# Patient Record
Sex: Male | Born: 1965 | Race: Black or African American | Hispanic: No | Marital: Married | Smoking: Former smoker
Health system: Southern US, Community
[De-identification: ages and names within clinical notes are randomized; demographics above are authoritative.]

## PROBLEM LIST (undated history)

## (undated) DIAGNOSIS — G4730 Sleep apnea, unspecified: Secondary | ICD-10-CM

## (undated) DIAGNOSIS — D481 Neoplasm of uncertain behavior of connective and other soft tissue: Secondary | ICD-10-CM

## (undated) DIAGNOSIS — G5710 Meralgia paresthetica, unspecified lower limb: Secondary | ICD-10-CM

## (undated) DIAGNOSIS — K5792 Diverticulitis of intestine, part unspecified, without perforation or abscess without bleeding: Secondary | ICD-10-CM

## (undated) DIAGNOSIS — C801 Malignant (primary) neoplasm, unspecified: Secondary | ICD-10-CM

## (undated) DIAGNOSIS — I10 Essential (primary) hypertension: Secondary | ICD-10-CM

## (undated) DIAGNOSIS — J189 Pneumonia, unspecified organism: Secondary | ICD-10-CM

## (undated) DIAGNOSIS — Z923 Personal history of irradiation: Secondary | ICD-10-CM

## (undated) DIAGNOSIS — S83209A Unspecified tear of unspecified meniscus, current injury, unspecified knee, initial encounter: Secondary | ICD-10-CM

## (undated) HISTORY — PX: TUMOR REMOVAL: SHX12

## (undated) HISTORY — PX: LEG SURGERY: SHX1003

## (undated) HISTORY — DX: Meralgia paresthetica, unspecified lower limb: G57.10

## (undated) HISTORY — PX: OTHER SURGICAL HISTORY: SHX169

---

## 1998-04-27 HISTORY — PX: OTHER SURGICAL HISTORY: SHX169

## 2004-06-05 ENCOUNTER — Ambulatory Visit (HOSPITAL_COMMUNITY): Admission: RE | Admit: 2004-06-05 | Discharge: 2004-06-05 | Payer: Self-pay | Admitting: Gastroenterology

## 2006-05-29 ENCOUNTER — Emergency Department (HOSPITAL_COMMUNITY): Admission: EM | Admit: 2006-05-29 | Discharge: 2006-05-30 | Payer: Self-pay | Admitting: Emergency Medicine

## 2006-06-03 ENCOUNTER — Encounter: Admission: RE | Admit: 2006-06-03 | Discharge: 2006-06-03 | Payer: Self-pay | Admitting: Family Medicine

## 2007-01-21 ENCOUNTER — Ambulatory Visit (HOSPITAL_COMMUNITY): Admission: RE | Admit: 2007-01-21 | Discharge: 2007-01-21 | Payer: Self-pay | Admitting: Surgery

## 2007-01-25 ENCOUNTER — Encounter: Admission: RE | Admit: 2007-01-25 | Discharge: 2007-01-25 | Payer: Self-pay | Admitting: Surgery

## 2007-02-09 ENCOUNTER — Ambulatory Visit (HOSPITAL_COMMUNITY): Admission: RE | Admit: 2007-02-09 | Discharge: 2007-02-09 | Payer: Self-pay | Admitting: Surgery

## 2007-03-16 ENCOUNTER — Ambulatory Visit (HOSPITAL_COMMUNITY): Admission: RE | Admit: 2007-03-16 | Discharge: 2007-03-16 | Payer: Self-pay | Admitting: Surgery

## 2007-04-12 ENCOUNTER — Encounter: Admission: RE | Admit: 2007-04-12 | Discharge: 2007-04-12 | Payer: Self-pay | Admitting: Surgery

## 2007-04-26 ENCOUNTER — Ambulatory Visit (HOSPITAL_COMMUNITY): Admission: RE | Admit: 2007-04-26 | Discharge: 2007-04-27 | Payer: Self-pay | Admitting: Surgery

## 2007-04-26 HISTORY — PX: LAPAROSCOPIC GASTRIC BANDING: SHX1100

## 2007-05-03 ENCOUNTER — Encounter: Admission: RE | Admit: 2007-05-03 | Discharge: 2007-08-01 | Payer: Self-pay | Admitting: Surgery

## 2007-08-10 ENCOUNTER — Encounter: Admission: RE | Admit: 2007-08-10 | Discharge: 2007-08-10 | Payer: Self-pay | Admitting: Surgery

## 2007-11-08 ENCOUNTER — Encounter: Admission: RE | Admit: 2007-11-08 | Discharge: 2007-11-08 | Payer: Self-pay | Admitting: Surgery

## 2008-08-25 HISTORY — PX: HERNIA REPAIR: SHX51

## 2008-10-22 ENCOUNTER — Ambulatory Visit (HOSPITAL_COMMUNITY): Admission: RE | Admit: 2008-10-22 | Discharge: 2008-10-22 | Payer: Self-pay | Admitting: Surgery

## 2008-10-22 HISTORY — PX: OTHER SURGICAL HISTORY: SHX169

## 2008-10-30 ENCOUNTER — Encounter: Admission: RE | Admit: 2008-10-30 | Discharge: 2008-10-30 | Payer: Self-pay | Admitting: Family Medicine

## 2008-10-31 ENCOUNTER — Encounter: Admission: RE | Admit: 2008-10-31 | Discharge: 2008-10-31 | Payer: Self-pay | Admitting: Family Medicine

## 2009-01-25 HISTORY — PX: SHOULDER ARTHROSCOPY: SHX128

## 2009-07-01 ENCOUNTER — Encounter: Admission: RE | Admit: 2009-07-01 | Discharge: 2009-07-01 | Payer: Self-pay | Admitting: Surgery

## 2010-08-18 ENCOUNTER — Other Ambulatory Visit (HOSPITAL_COMMUNITY): Payer: Self-pay | Admitting: Surgery

## 2010-08-18 ENCOUNTER — Ambulatory Visit (HOSPITAL_COMMUNITY)
Admission: RE | Admit: 2010-08-18 | Discharge: 2010-08-18 | Disposition: A | Discharge status: Home / Self Care | Payer: BC Managed Care – PPO | Source: Ambulatory Visit | Attending: Family Medicine | Admitting: Family Medicine

## 2010-08-18 ENCOUNTER — Other Ambulatory Visit (HOSPITAL_COMMUNITY): Payer: Self-pay | Admitting: Family Medicine

## 2010-08-18 DIAGNOSIS — Z9884 Bariatric surgery status: Secondary | ICD-10-CM | POA: Insufficient documentation

## 2010-08-18 DIAGNOSIS — K7689 Other specified diseases of liver: Secondary | ICD-10-CM | POA: Insufficient documentation

## 2010-08-18 DIAGNOSIS — R112 Nausea with vomiting, unspecified: Secondary | ICD-10-CM | POA: Insufficient documentation

## 2010-08-18 DIAGNOSIS — K4090 Unilateral inguinal hernia, without obstruction or gangrene, not specified as recurrent: Secondary | ICD-10-CM | POA: Insufficient documentation

## 2010-08-18 DIAGNOSIS — J984 Other disorders of lung: Secondary | ICD-10-CM | POA: Insufficient documentation

## 2010-08-18 DIAGNOSIS — R109 Unspecified abdominal pain: Secondary | ICD-10-CM | POA: Insufficient documentation

## 2010-08-18 MED ORDER — IOHEXOL 300 MG/ML  SOLN
100.0000 mL | Freq: Once | INTRAMUSCULAR | Status: AC | PRN
  Administered 2010-08-18: 100 mL via INTRAVENOUS

## 2010-09-09 NOTE — Op Note (Signed)
NAMESENDER, RUEB                 ACCOUNT NO.:  [id number]   MEDICAL RECORD NO.:  [id number]          PATIENT TYPE:  OIB   LOCATION:  0098                         FACILITY:  [facility]   PHYSICIAN:  Thornton Park. Daphine Deutscher, MD  DATE OF BIRTH:  01/08/1966   DATE OF PROCEDURE:  04/26/2007  DATE OF DISCHARGE:                               OPERATIVE REPORT   CCS NUMBER:  [id number].   PREOPERATIVE DIAGNOSIS:  Morbid obesity with a body mass index of 44.   PROCEDURE:  Lap band APL system by [employer].   SURGEON:  Thornton Park. Daphine Deutscher, MD.   ASSISTANTSharlet Salina T. Hoxworth, M.D.   ANESTHESIA:  General endotracheal.   DESCRIPTION OF PROCEDURE:  Nathaniel Chambers was taken to room #1 and given  general anesthesia.  The abdomen was prepped with Techni-Care and draped  sterilely.  Access was gained through the left upper quadrant using a  OptiVu technique without difficulty insufflating the abdomen.  Standard  trocars were used with the exception of two 12s on the right side.  Band  passer was passed through the upper port after initial dissection was  done.  This band passer slipped along without difficulty and once in  place, we introduced an APL band because of the amount of fat in the  gastrohepatic window.  This was placed in the abdomen and threaded  through the band passer and brought around the stomach and with the  sizing tubing in place, it was snapped in place.  Tubing was then  removed and it was held down while I plicated it with three interrupted  sutures of the Surgidac, held in place with tie knots.  We checked  position and looked to be in good position at the end of the case and  tubing was then brought out through the lower port on the right which  was enlarged.  Four sutures were placed in the fascia of 2-0 Prolene and  then the port was attached after letting some of the fluid out and then  secured to the fascia.  I irrigated very well to get all the fat  saponification out and then  closed the ports with 4-0 Vicryl with  Benzoin and Steri-Strips.  The patient tolerated the procedure well and  was taken to the recovery room in satisfactory condition.      Thornton Park Daphine Deutscher, MD  Electronically Signed     MBM/MEDQ  D:  04/26/2007  T:  04/26/2007  Job:  [id number]   cc:   Duncan Dull, M.D.  Fax: [phone]

## 2010-09-09 NOTE — Op Note (Signed)
NAME:  Nathaniel Chambers, Nathaniel Chambers                 ACCOUNT NO.:  [id number]   MEDICAL RECORD NO.:  [id number]          PATIENT TYPE:  AMB   LOCATION:  DAY                          FACILITY:  [facility]   PHYSICIAN:  Thornton Park. Daphine Deutscher, MD  DATE OF BIRTH:  [birth year]   DATE OF PROCEDURE:  10/22/2008  DATE OF DISCHARGE:                               OPERATIVE REPORT   PREOPERATIVE DIAGNOSIS:  Recurrent right inguinal hernia.   POSTOPERATIVE DIAGNOSIS:  Right direct hernia after pediatric  herniorrhaphy.   SURGEON:  Thornton Park. Daphine Deutscher, MD.   ANESTHESIA:  General endotracheal.   DESCRIPTION OF PROCEDURE:  Nathaniel Chambers is a 45 year old band patient who  has lost a number of pounds and has been more active, but has noticed a  bulge in his right groin was some discomfort.  It is at the site of a  previous pediatric hernia repair.   Nathaniel Chambers was taken to room 11 on Monday, October 22, 2008, and given  general anesthesia.  The abdomen was clipped and then prepped with a  chlorhexidine equivalent and draped sterilely.  Informed consent had  been obtained preoperatively, but I also marked the area with him in the  holding area and we confirmed that we were operating on the right  inguinal region.   A small oblique incision was made and carried down through the adipose  tissue to the external oblique.  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  This is probably is related to his  previous surgery.  After mobilizing the cord structures and getting a  Penrose around it, I dissected free a large direct sac that sort of  involved most of the floor.  I freed that from the cord structures.  I  went ahead and repaired this as his external oblique had been allowed to  fall back aside his cord exteriorizing things somewhat.  I went ahead  and cut a  piece of UltraPro mesh and sutured along the inguinal ligament  with a running 2-0 Prolene.  It similarly was sewn in medially getting  good purchases of fascia above this obvious direct defect.  It was  brought around on either side of the cord and sutured to itself and then  tucked beneath the external oblique and sewn again with a horizontal  mattress suture of 2-0 Prolene.  There was essentially no external  oblique to close over the cord structures having been rendered away in  this pediatric case.  The wound was injected with some Marcaine and the  wound was closed in layers with 4-0 Vicryl and with running subcuticular  5-0 Monocryl, Benzoin and Steri-Strips.  The patient seemed to tolerate  the procedure well  and was taken to the recovery room in satisfactory  condition.      Thornton Park Daphine Deutscher, MD  Electronically Signed     MBM/MEDQ  D:  10/22/2008  T:  10/22/2008  Job:  [id number]

## 2011-01-05 ENCOUNTER — Encounter (INDEPENDENT_AMBULATORY_CARE_PROVIDER_SITE_OTHER): Payer: Self-pay | Admitting: Surgery

## 2011-01-30 LAB — DIFFERENTIAL
Basophils Absolute: 0
Eosinophils Relative: 2
Lymphocytes Relative: 22
Lymphocytes Relative: 44
Lymphs Abs: 3.1
Monocytes Absolute: 0.5
Monocytes Relative: 4
Monocytes Relative: 4
Neutro Abs: 10 — ABNORMAL HIGH
Neutro Abs: 4.5
Neutrophils Relative %: 49

## 2011-01-30 LAB — CBC
HCT: 44.5
Hemoglobin: 15
MCHC: 34.3
MCV: 86.7
MCV: 87.5
Platelets: 339
Platelets: 344
RBC: 5.09
RBC: 5.12
RDW: 13.8
RDW: 14

## 2011-01-30 LAB — COMPREHENSIVE METABOLIC PANEL
ALT: 28
Alkaline Phosphatase: 60
GFR calc Af Amer: >60
GFR calc non Af Amer: >60
Glucose, Bld: 89
Sodium: 137
Total Bilirubin: 1
Total Protein: 7.5

## 2011-06-24 ENCOUNTER — Other Ambulatory Visit: Payer: Self-pay | Admitting: Orthopedic Surgery

## 2011-06-25 ENCOUNTER — Encounter (HOSPITAL_BASED_OUTPATIENT_CLINIC_OR_DEPARTMENT_OTHER): Payer: Self-pay | Admitting: *Deleted

## 2011-06-26 ENCOUNTER — Encounter (HOSPITAL_BASED_OUTPATIENT_CLINIC_OR_DEPARTMENT_OTHER): Payer: Self-pay | Admitting: *Deleted

## 2011-06-26 NOTE — Progress Notes (Signed)
To [facility] at 1130.Hg,Ekg on arrival.Npo after mn-will take his singular,use advair that am.aware will be transferred to main hospital post op for overnight observation.

## 2011-07-01 ENCOUNTER — Encounter (HOSPITAL_COMMUNITY): Payer: Self-pay | Admitting: *Deleted

## 2011-07-01 ENCOUNTER — Other Ambulatory Visit: Payer: Self-pay | Admitting: Orthopedic Surgery

## 2011-07-01 ENCOUNTER — Observation Stay (HOSPITAL_BASED_OUTPATIENT_CLINIC_OR_DEPARTMENT_OTHER)
Admission: RE | Admit: 2011-07-01 | Discharge: 2011-07-03 | Disposition: A | Discharge status: Home / Self Care | Payer: BC Managed Care – PPO | Source: Ambulatory Visit | Attending: Orthopedic Surgery | Admitting: Orthopedic Surgery

## 2011-07-01 ENCOUNTER — Encounter (HOSPITAL_BASED_OUTPATIENT_CLINIC_OR_DEPARTMENT_OTHER): Payer: Self-pay | Admitting: Anesthesiology

## 2011-07-01 ENCOUNTER — Encounter (HOSPITAL_BASED_OUTPATIENT_CLINIC_OR_DEPARTMENT_OTHER): Payer: Self-pay | Admitting: *Deleted

## 2011-07-01 ENCOUNTER — Ambulatory Visit (HOSPITAL_BASED_OUTPATIENT_CLINIC_OR_DEPARTMENT_OTHER): Payer: BC Managed Care – PPO | Admitting: Anesthesiology

## 2011-07-01 ENCOUNTER — Encounter (HOSPITAL_COMMUNITY)
Admission: RE | Disposition: A | Discharge status: Home / Self Care | Payer: Self-pay | Source: Ambulatory Visit | Attending: Orthopedic Surgery

## 2011-07-01 ENCOUNTER — Other Ambulatory Visit: Payer: Self-pay

## 2011-07-01 DIAGNOSIS — Z79899 Other long term (current) drug therapy: Secondary | ICD-10-CM | POA: Insufficient documentation

## 2011-07-01 DIAGNOSIS — M75122 Complete rotator cuff tear or rupture of left shoulder, not specified as traumatic: Secondary | ICD-10-CM

## 2011-07-01 DIAGNOSIS — S43429A Sprain of unspecified rotator cuff capsule, initial encounter: Principal | ICD-10-CM | POA: Insufficient documentation

## 2011-07-01 DIAGNOSIS — M19019 Primary osteoarthritis, unspecified shoulder: Secondary | ICD-10-CM | POA: Insufficient documentation

## 2011-07-01 DIAGNOSIS — M25519 Pain in unspecified shoulder: Secondary | ICD-10-CM | POA: Insufficient documentation

## 2011-07-01 DIAGNOSIS — J45909 Unspecified asthma, uncomplicated: Secondary | ICD-10-CM | POA: Insufficient documentation

## 2011-07-01 DIAGNOSIS — Z9884 Bariatric surgery status: Secondary | ICD-10-CM | POA: Insufficient documentation

## 2011-07-01 DIAGNOSIS — Z01810 Encounter for preprocedural cardiovascular examination: Secondary | ICD-10-CM | POA: Insufficient documentation

## 2011-07-01 DIAGNOSIS — X58XXXA Exposure to other specified factors, initial encounter: Secondary | ICD-10-CM | POA: Insufficient documentation

## 2011-07-01 HISTORY — PX: SHOULDER ARTHROSCOPY: SHX128

## 2011-07-01 HISTORY — PX: SHOULDER OPEN ROTATOR CUFF REPAIR: SHX2407

## 2011-07-01 SURGERY — ARTHROSCOPY, SHOULDER
Anesthesia: General | Site: Shoulder | Laterality: Left | Wound class: Clean

## 2011-07-01 MED ORDER — MIDAZOLAM HCL 5 MG/5ML IJ SOLN
INTRAMUSCULAR | Status: DC | PRN
  Administered 2011-07-01: 2 mg via INTRAVENOUS

## 2011-07-01 MED ORDER — SODIUM CHLORIDE 0.9 % IV SOLN
INTRAVENOUS | Status: DC
  Administered 2011-07-01: 21:00:00 via INTRAVENOUS

## 2011-07-01 MED ORDER — STERILE WATER FOR IRRIGATION IR SOLN
Status: DC | PRN
  Administered 2011-07-01: 500 mL

## 2011-07-01 MED ORDER — METHOCARBAMOL 500 MG PO TABS
500.0000 mg | ORAL_TABLET | Freq: Four times a day (QID) | ORAL | Status: DC | PRN
  Administered 2011-07-03 (×2): 500 mg via ORAL
  Filled 2011-07-01 (×2): qty 1

## 2011-07-01 MED ORDER — GLYCOPYRROLATE 0.2 MG/ML IJ SOLN
INTRAMUSCULAR | Status: DC | PRN
  Administered 2011-07-01: 0.2 mg via INTRAVENOUS

## 2011-07-01 MED ORDER — ACETAMINOPHEN 650 MG RE SUPP: 650.0000 mg | Freq: Four times a day (QID) | RECTAL | Status: DC | PRN

## 2011-07-01 MED ORDER — POVIDONE-IODINE 7.5 % EX SOLN: Freq: Once | CUTANEOUS | Status: DC

## 2011-07-01 MED ORDER — FLUTICASONE-SALMETEROL 250-50 MCG/DOSE IN AEPB
1.0000 | INHALATION_SPRAY | Freq: Two times a day (BID) | RESPIRATORY_TRACT | Status: DC
  Administered 2011-07-02 – 2011-07-03 (×3): 1 via RESPIRATORY_TRACT
  Filled 2011-07-01: qty 14

## 2011-07-01 MED ORDER — ONDANSETRON HCL 4 MG PO TABS
4.0000 mg | ORAL_TABLET | Freq: Four times a day (QID) | ORAL | Status: DC | PRN
  Administered 2011-07-02: 4 mg via ORAL
  Filled 2011-07-01: qty 1

## 2011-07-01 MED ORDER — LACTATED RINGERS IV SOLN
INTRAVENOUS | Status: DC | PRN
  Administered 2011-07-01 (×2): via INTRAVENOUS

## 2011-07-01 MED ORDER — HYDROMORPHONE HCL PF 1 MG/ML IJ SOLN
0.2500 mg | INTRAMUSCULAR | Status: DC | PRN
  Administered 2011-07-01 (×2): 0.5 mg via INTRAVENOUS
  Administered 2011-07-01 (×2): 0.25 mg via INTRAVENOUS

## 2011-07-01 MED ORDER — MEPERIDINE HCL 25 MG/ML IJ SOLN: 6.2500 mg | INTRAMUSCULAR | Status: DC | PRN

## 2011-07-01 MED ORDER — METHOCARBAMOL 100 MG/ML IJ SOLN
500.0000 mg | Freq: Four times a day (QID) | INTRAVENOUS | Status: DC | PRN
  Filled 2011-07-01: qty 5

## 2011-07-01 MED ORDER — VANCOMYCIN HCL 1000 MG IV SOLR
1500.0000 mg | Freq: Once | INTRAVENOUS | Status: AC
  Administered 2011-07-01: 1500 mg via INTRAVENOUS

## 2011-07-01 MED ORDER — PROPOFOL 10 MG/ML IV EMUL
INTRAVENOUS | Status: DC | PRN
  Administered 2011-07-01: 350 mg via INTRAVENOUS

## 2011-07-01 MED ORDER — BUPIVACAINE-EPINEPHRINE 0.5% -1:200000 IJ SOLN
INTRAMUSCULAR | Status: DC | PRN
  Administered 2011-07-01: 3 mL

## 2011-07-01 MED ORDER — HYDROCODONE-ACETAMINOPHEN 5-325 MG PO TABS
1.0000 | ORAL_TABLET | ORAL | Status: DC | PRN
  Administered 2011-07-02 – 2011-07-03 (×6): 2 via ORAL
  Filled 2011-07-01 (×6): qty 2

## 2011-07-01 MED ORDER — METOCLOPRAMIDE HCL 10 MG PO TABS: 5.0000 mg | ORAL_TABLET | Freq: Three times a day (TID) | ORAL | Status: DC | PRN

## 2011-07-01 MED ORDER — KETOROLAC TROMETHAMINE 30 MG/ML IJ SOLN
30.0000 mg | Freq: Four times a day (QID) | INTRAMUSCULAR | Status: DC
  Administered 2011-07-02 (×4): 30 mg via INTRAVENOUS
  Filled 2011-07-01 (×12): qty 1

## 2011-07-01 MED ORDER — LACTATED RINGERS IV SOLN: INTRAVENOUS | Status: DC

## 2011-07-01 MED ORDER — ROPIVACAINE HCL 5 MG/ML IJ SOLN
INTRAMUSCULAR | Status: DC | PRN
  Administered 2011-07-01: 30 mL

## 2011-07-01 MED ORDER — FENTANYL CITRATE 0.05 MG/ML IJ SOLN
INTRAMUSCULAR | Status: DC | PRN
  Administered 2011-07-01: 100 ug via INTRAVENOUS
  Administered 2011-07-01: 50 ug via INTRAVENOUS

## 2011-07-01 MED ORDER — MENTHOL 3 MG MT LOZG: 1.0000 | LOZENGE | OROMUCOSAL | Status: DC | PRN

## 2011-07-01 MED ORDER — VANCOMYCIN HCL IN DEXTROSE 1-5 GM/200ML-% IV SOLN
1000.0000 mg | Freq: Two times a day (BID) | INTRAVENOUS | Status: AC
  Administered 2011-07-02: 1000 mg via INTRAVENOUS
  Filled 2011-07-01: qty 200

## 2011-07-01 MED ORDER — FENTANYL CITRATE 0.05 MG/ML IJ SOLN
50.0000 ug | Freq: Two times a day (BID) | INTRAMUSCULAR | Status: DC
  Administered 2011-07-01: 100 ug via INTRAVENOUS

## 2011-07-01 MED ORDER — EPHEDRINE SULFATE 50 MG/ML IJ SOLN
INTRAMUSCULAR | Status: DC | PRN
  Administered 2011-07-01 (×3): 10 mg via INTRAVENOUS

## 2011-07-01 MED ORDER — PROMETHAZINE HCL 25 MG/ML IJ SOLN: 6.2500 mg | INTRAMUSCULAR | Status: DC | PRN

## 2011-07-01 MED ORDER — ALUM & MAG HYDROXIDE-SIMETH 200-200-20 MG/5ML PO SUSP: 30.0000 mL | ORAL | Status: DC | PRN

## 2011-07-01 MED ORDER — CEFAZOLIN SODIUM-DEXTROSE 2-3 GM-% IV SOLR: 2.0000 g | INTRAVENOUS | Status: DC

## 2011-07-01 MED ORDER — MIDAZOLAM HCL 2 MG/2ML IJ SOLN
1.0000 mg | Freq: Once | INTRAMUSCULAR | Status: AC
  Administered 2011-07-01: 2 mg via INTRAVENOUS

## 2011-07-01 MED ORDER — ONDANSETRON HCL 4 MG/2ML IJ SOLN
4.0000 mg | Freq: Four times a day (QID) | INTRAMUSCULAR | Status: DC | PRN
  Administered 2011-07-01 – 2011-07-02 (×2): 4 mg via INTRAVENOUS
  Filled 2011-07-01 (×2): qty 2

## 2011-07-01 MED ORDER — PHENOL 1.4 % MT LIQD: 1.0000 | OROMUCOSAL | Status: DC | PRN

## 2011-07-01 MED ORDER — METHOCARBAMOL 500 MG PO TABS
500.0000 mg | ORAL_TABLET | Freq: Once | ORAL | Status: AC
  Administered 2011-07-01: 500 mg via ORAL

## 2011-07-01 MED ORDER — ACETAMINOPHEN 325 MG PO TABS
650.0000 mg | ORAL_TABLET | Freq: Four times a day (QID) | ORAL | Status: DC | PRN
  Administered 2011-07-03: 650 mg via ORAL
  Filled 2011-07-01: qty 2

## 2011-07-01 MED ORDER — ALBUTEROL SULFATE (2.5 MG/3ML) 0.083% IN NEBU
2.5000 mg | INHALATION_SOLUTION | Freq: Four times a day (QID) | RESPIRATORY_TRACT | Status: DC | PRN
Start: 2011-07-01 — End: 2011-07-03
  Filled 2011-07-01: qty 3

## 2011-07-01 MED ORDER — FLUTICASONE-SALMETEROL 115-21 MCG/ACT IN AERO: 2.0000 | INHALATION_SPRAY | RESPIRATORY_TRACT | Status: DC

## 2011-07-01 MED ORDER — SODIUM CHLORIDE 0.9 % IR SOLN
Status: DC | PRN
  Administered 2011-07-01: 14:00:00

## 2011-07-01 MED ORDER — KETOROLAC TROMETHAMINE 30 MG/ML IJ SOLN
INTRAMUSCULAR | Status: DC | PRN
  Administered 2011-07-01: 30 mg via INTRAVENOUS

## 2011-07-01 MED ORDER — MEPERIDINE HCL 50 MG PO TABS: 50.0000 mg | ORAL_TABLET | ORAL | Status: DC | PRN

## 2011-07-01 MED ORDER — HYDROMORPHONE HCL PF 1 MG/ML IJ SOLN
0.5000 mg | INTRAMUSCULAR | Status: DC | PRN
  Administered 2011-07-02: 0.5 mg via INTRAVENOUS
  Filled 2011-07-01: qty 1

## 2011-07-01 MED ORDER — MONTELUKAST SODIUM 10 MG PO TABS
10.0000 mg | ORAL_TABLET | Freq: Every morning | ORAL | Status: DC
Start: 2011-07-02 — End: 2011-07-03
  Administered 2011-07-02 – 2011-07-03 (×2): 10 mg via ORAL
  Filled 2011-07-01 (×3): qty 1

## 2011-07-01 MED ORDER — SUCCINYLCHOLINE CHLORIDE 20 MG/ML IJ SOLN
INTRAMUSCULAR | Status: DC | PRN
  Administered 2011-07-01: 140 mg via INTRAVENOUS

## 2011-07-01 MED ORDER — METOCLOPRAMIDE HCL 5 MG/ML IJ SOLN: 5.0000 mg | Freq: Three times a day (TID) | INTRAMUSCULAR | Status: DC | PRN

## 2011-07-01 MED ORDER — ONDANSETRON HCL 4 MG/2ML IJ SOLN
INTRAMUSCULAR | Status: DC | PRN
  Administered 2011-07-01: 4 mg via INTRAVENOUS

## 2011-07-01 MED ORDER — LIDOCAINE HCL (CARDIAC) 20 MG/ML IV SOLN
INTRAVENOUS | Status: DC | PRN
  Administered 2011-07-01: 100 mg via INTRAVENOUS

## 2011-07-01 SURGICAL SUPPLY — 79 items
ANCH SUT 2 5.5 BABSR ASCP (Orthopedic Implant) ×2 IMPLANT
ANCHOR PEEK ZIP 5.5 NDL NO2 (Orthopedic Implant) ×2 IMPLANT
APL SKNCLS STERI-STRIP NONHPOA (GAUZE/BANDAGES/DRESSINGS) ×2
BENZOIN TINCTURE PRP APPL 2/3 (GAUZE/BANDAGES/DRESSINGS) ×3 IMPLANT
BLADE 4.2CUDA (BLADE) ×3 IMPLANT
BLADE CUDA 4.2 (BLADE) IMPLANT
BLADE CUDA 5.5 (BLADE) IMPLANT
BLADE CUDA SHAVER 3.5 (BLADE) IMPLANT
BLADE CUTTER GATOR 3.5 (BLADE) IMPLANT
BLADE FLAT COURSE (BLADE) IMPLANT
BLADE GREAT WHITE 4.2 (BLADE) IMPLANT
BLADE OSC/SAG .038X5.5 CUT EDG (BLADE) ×2 IMPLANT
BLADE SURG 10 STRL SS (BLADE) IMPLANT
BLADE SURG 15 STRL LF DISP TIS (BLADE) IMPLANT
BLADE SURG 15 STRL SS (BLADE)
BUR OVAL 4.0 (BURR) ×3 IMPLANT
CANISTER SUCT LVC 12 LTR MEDI- (MISCELLANEOUS) ×4 IMPLANT
CANISTER SUCTION 1200CC (MISCELLANEOUS) ×3 IMPLANT
CLEANER CAUTERY TIP 5X5 PAD (MISCELLANEOUS) IMPLANT
CLOTH BEACON ORANGE TIMEOUT ST (SAFETY) ×3 IMPLANT
DRAPE LG THREE QUARTER DISP (DRAPES) ×6 IMPLANT
DRAPE SHOULDER BEACH CHAIR (DRAPES) ×3 IMPLANT
DRAPE U-SHAPE 47X51 STRL (DRAPES) ×3 IMPLANT
DRSG ADAPTIC 3X8 NADH LF (GAUZE/BANDAGES/DRESSINGS) ×3 IMPLANT
DRSG PAD ABDOMINAL 8X10 ST (GAUZE/BANDAGES/DRESSINGS) ×3 IMPLANT
DURAPREP 26ML APPLICATOR (WOUND CARE) ×3 IMPLANT
ELECT MENISCUS 165MM 90D (ELECTRODE) IMPLANT
ELECT REM PT RETURN 9FT ADLT (ELECTROSURGICAL) ×3
ELECTRODE REM PT RTRN 9FT ADLT (ELECTROSURGICAL) ×2 IMPLANT
GLOVE BIOGEL M 6.5 STRL (GLOVE) ×2 IMPLANT
GLOVE BIOGEL PI IND STRL 8 (GLOVE) ×2 IMPLANT
GLOVE BIOGEL PI INDICATOR 8 (GLOVE) ×1
GLOVE ECLIPSE 6.0 STRL STRAW (GLOVE) ×2 IMPLANT
GLOVE ECLIPSE 8.0 STRL XLNG CF (GLOVE) ×10 IMPLANT
GLOVE INDICATOR 8.0 STRL GRN (GLOVE) ×9 IMPLANT
GOWN PREVENTION PLUS LG XLONG (DISPOSABLE) ×3 IMPLANT
GOWN STRL REIN XL XLG (GOWN DISPOSABLE) ×6 IMPLANT
IV NS IRRIG 3000ML ARTHROMATIC (IV SOLUTION) ×6 IMPLANT
NDL 1/2 CIR CATGUT .05X1.09 (NEEDLE) IMPLANT
NDL HYPO 18GX1.5 BLUNT FILL (NEEDLE) ×1 IMPLANT
NDL SAFETY ECLIPSE 18X1.5 (NEEDLE) ×2 IMPLANT
NEEDLE 1/2 CIR CATGUT .05X1.09 (NEEDLE) IMPLANT
NEEDLE HYPO 18GX1.5 BLUNT FILL (NEEDLE) ×3 IMPLANT
NEEDLE HYPO 18GX1.5 SHARP (NEEDLE) ×3
NEEDLE HYPO 22GX1.5 SAFETY (NEEDLE) ×3 IMPLANT
NS IRRIG 500ML POUR BTL (IV SOLUTION) ×3 IMPLANT
PACK ARTHROSCOPY DSU (CUSTOM PROCEDURE TRAY) ×3 IMPLANT
PACK BASIN DAY SURGERY FS (CUSTOM PROCEDURE TRAY) ×3 IMPLANT
PAD CLEANER CAUTERY TIP 5X5 (MISCELLANEOUS)
PENCIL BUTTON HOLSTER BLD 10FT (ELECTRODE) IMPLANT
SET ARTHROSCOPY TUBING (MISCELLANEOUS) ×3
SET ARTHROSCOPY TUBING LN (MISCELLANEOUS) ×2 IMPLANT
SLING ARM IMMOBILIZER LRG (SOFTGOODS) ×2 IMPLANT
SLING ARM IMMOBILIZER XL (CAST SUPPLIES) ×2 IMPLANT
SPONGE GAUZE 4X4 12PLY (GAUZE/BANDAGES/DRESSINGS) ×3 IMPLANT
SPONGE LAP 4X18 X RAY DECT (DISPOSABLE) ×2 IMPLANT
SPONGE SURGIFOAM ABS GEL 100 (HEMOSTASIS) ×2 IMPLANT
STAPLER VISISTAT 35W (STAPLE) ×2 IMPLANT
STRIP CLOSURE SKIN 1/2X4 (GAUZE/BANDAGES/DRESSINGS) ×3 IMPLANT
SUCTION FRAZIER TIP 10 FR DISP (SUCTIONS) ×3 IMPLANT
SUT BONE WAX W31G (SUTURE) ×3 IMPLANT
SUT ETHIBOND GREEN BRAID 0S 4 (SUTURE) IMPLANT
SUT ETHIBOND NAB CT1 #1 30IN (SUTURE) IMPLANT
SUT ETHILON 4 0 PS 2 18 (SUTURE) ×4 IMPLANT
SUT VIC AB 0 CT1 36 (SUTURE) IMPLANT
SUT VIC AB 1 CT1 36 (SUTURE) ×2 IMPLANT
SUT VIC AB 2-0 CT1 27 (SUTURE) ×3
SUT VIC AB 2-0 CT1 TAPERPNT 27 (SUTURE) ×3 IMPLANT
SUT VIC AB 3-0 SH 27 (SUTURE)
SUT VIC AB 3-0 SH 27X BRD (SUTURE) IMPLANT
SUT VICRYL 4-0 PS2 18IN ABS (SUTURE) IMPLANT
SYR BULB IRRIGATION 50ML (SYRINGE) ×3 IMPLANT
SYRINGE 10CC LL (SYRINGE) ×3 IMPLANT
TAPE CLOTH SURG 6X10 WHT LF (GAUZE/BANDAGES/DRESSINGS) ×2 IMPLANT
TAPE HYPAFIX 6X30 (GAUZE/BANDAGES/DRESSINGS) ×2 IMPLANT
TOWEL OR 17X24 6PK STRL BLUE (TOWEL DISPOSABLE) ×3 IMPLANT
TUBE CONNECTING 12X1/4 (SUCTIONS) ×3 IMPLANT
WAND 90 DEG TURBOVAC W/CORD (SURGICAL WAND) ×3 IMPLANT
WATER STERILE IRR 500ML POUR (IV SOLUTION) ×3 IMPLANT

## 2011-07-01 NOTE — Brief Op Note (Signed)
07/01/2011  3:51 PM  PATIENT:  Nathaniel Chambers  46 y.o. male  PRE-OPERATIVE DIAGNOSIS:  LEFT SHOULDER ROTATOR CUFF TEAR and degenerative arthritis ac joint  POST-OPERATIVE DIAGNOSIS:  LEFT SHOULDER ROTATOR CUFF TEAR,labtal tear, and degenerative arthritis ac joint  PROCEDURE:  Procedure(s) (LRB): ARTHROSCOPY SHOULDER (Left)with labral debridement ROTATOR CUFF REPAIR SHOULDER OPEN with distal clavicle resection  SURGEON:  Surgeon(s) and Role:    * Drucilla Schmidt, MD - Primary  PHYSICIAN ASSISTANT:   ASSISTANTS: Mr Idolina Primer [facility]   ANESTHESIA:   regional and general  EBL:  Total I/O In: 1500 [I.V.:1500] Out: -   BLOOD ADMINISTERED:none  DRAINS: none   LOCAL MEDICATIONS USED:  MARCAINE     SPECIMEN:  No Specimen  DISPOSITION OF SPECIMEN:  N/A  COUNTS:  YES  TOURNIQUET:  * No tourniquets in log *  DICTATION: .Other Dictation: Dictation Number [id number]  PLAN OF CARE: Admit for overnight observation  PATIENT DISPOSITION:  PACU - hemodynamically stable.   Delay start of Pharmacological VTE agent (>24hrs) due to surgical blood loss or risk of bleeding: not applicable

## 2011-07-01 NOTE — H&P (Signed)
Nathaniel Chambers is an 46 y.o. male.   Chief Complaint:painfull lt shoulder HPI:MRI demonstrates almost a complete supraspinatus tear and partial undersurface tear of subscapularis;degenerative arthritis of ac joint  Past Medical History  Diagnosis Date  . Asthma     Past Surgical History  Procedure Date  . Laparoscopic gastric banding 04-26-2007  . Repair recurrent right inguinal hernia 10-22-2008  . Surg. for undescended testicles/ bilateral inguinal hernia repair AGE 89 MON OLD  . Left shoulder surg 2000  . Hernia repair 08/2008    rt inguinal  . Shoulder arthroscopy 01/2009    rt     History reviewed. No pertinent family history. Social History:  reports that he quit smoking about 23 years ago. He does not have any smokeless tobacco history on file. He reports that he does not drink alcohol or use illicit drugs.  Allergies:  Allergies  Allergen Reactions  . Penicillins Shortness Of Breath    Swelling of uvula  . Solu-Medrol (Methylprednisolone Sodium Succ) Anaphylaxis  . Oxycodone Nausea And Vomiting    Medications Prior to Admission  Medication Dose Route Frequency Provider Last Rate Last Dose  . 1 mL EPINEPHrine 1 mg/mL (1:1000) in 0.9% Normal Saline 3000 mL irrigation    PRN Illene Labrador Jackquelyn Sundberg, MD      . 1 mL EPINEPHrine 1 mg/mL (1:1000) in 0.9% Normal Saline 3000 mL irrigation    PRN Illene Labrador Minnah Llamas, MD      . fentaNYL (SUBLIMAZE) injection 50 mcg  50 mcg Intravenous BID Phillips Grout, MD   100 mcg at 07/01/11 1241  . lactated ringers infusion   Intravenous Continuous Azell Der, MD      . midazolam (VERSED) injection 1 mg  1 mg Intravenous Once Phillips Grout, MD   2 mg at 07/01/11 1240  . povidone-iodine (BETADINE) 7.5 % scrub   Topical Once Illene Labrador Mikel Pyon, MD      . vancomycin (VANCOCIN) 1,500 mg in sodium chloride 0.9 % 500 mL IVPB  1,500 mg Intravenous Once Illene Labrador Laquandra Carrillo, MD   1,500 mg at 07/01/11 1315  . DISCONTD: ceFAZolin (ANCEF) IVPB 2 g/50 mL  premix  2 g Intravenous 60 min Pre-Op Drucilla Schmidt, MD       Medications Prior to Admission  Medication Sig Dispense Refill  . albuterol (PROVENTIL) (2.5 MG/3ML) 0.083% nebulizer solution Take 2.5 mg by nebulization every 6 (six) hours as needed.      . fluticasone-salmeterol (ADVAIR HFA) 115-21 MCG/ACT inhaler Inhale 2 puffs into the lungs 1 day or 1 dose.      . montelukast (SINGULAIR) 10 MG tablet Take 10 mg by mouth every morning.        No results found for this or any previous visit (from the past 48 hour(s)). No results found.  ROS  Blood pressure 136/69, pulse 78, temperature 98.4 F (36.9 C), temperature source Oral, resp. rate 14, height 6\' 2"  (1.88 m), weight 131.543 kg (290 lb), SpO2 96.00%. Physical Exam  Constitutional: He is oriented to person, place, and time. He appears well-developed and well-nourished.  HENT:  Head: Normocephalic and atraumatic.  Right Ear: External ear normal.  Left Ear: External ear normal.  Eyes: Conjunctivae and EOM are normal. Pupils are equal, round, and reactive to light.  Neck: Normal range of motion. Neck supple.  Cardiovascular: Normal rate, regular rhythm, normal heart sounds and intact distal pulses.   Respiratory: Effort normal and breath sounds normal.  GI: Soft. Bowel sounds  are normal.  Musculoskeletal: Normal range of motion.       He has had an interscalene block  Neurological: He is alert and oriented to person, place, and time. He has normal reflexes.  Skin: Skin is warm and dry.  Psychiatric: He has a normal mood and affect. His behavior is normal. Judgment and thought content normal.     Assessment/Plan Near complete rotator cuff tear lt shoulder; degenerative arthritis ac joinr Lt shoulder arthroscopy followed by distal clavicle resection, anterior acromionectomy with rotator cuff epair Deija Buhrman P 07/01/2011, 1:22 PM

## 2011-07-01 NOTE — Progress Notes (Signed)
Pt transferred to [facility] room 1311.  Report given to Eligah East RN by phone prior to transfer and again after patient transferred to room via stretcher.

## 2011-07-01 NOTE — Discharge Instructions (Signed)
Wear sling at all times.

## 2011-07-01 NOTE — Transfer of Care (Signed)
Immediate Anesthesia Transfer of Care Note  Patient: Nathaniel Chambers  Procedure(s) Performed: Procedure(s) (LRB): ARTHROSCOPY SHOULDER (Left) ROTATOR CUFF REPAIR SHOULDER OPEN (Left)  Patient Location: PACU  Anesthesia Type: General  Level of Consciousness: awake, sedated, patient cooperative and responds to stimulation  Airway & Oxygen Therapy: Patient Spontanous Breathing and Patient connected to face mask oxygen  Post-op Assessment: Report given to PACU RN, Post -op Vital signs reviewed and stable and Patient moving all extremities  Post vital signs: Reviewed and stable  Complications: No apparent anesthesia complications

## 2011-07-01 NOTE — Anesthesia Preprocedure Evaluation (Addendum)
Anesthesia Evaluation  Patient identified by MRN, date of birth, ID band Patient awake    Reviewed: Allergy & Precautions, H&P , NPO status , Patient's Chart, lab work & pertinent test results  Airway Mallampati: II TM Distance: >3 FB Neck ROM: Full    Dental No notable dental hx.    Pulmonary neg pulmonary ROS, asthma ,  breath sounds clear to auscultation  Pulmonary exam normal       Cardiovascular negative cardio ROS  Rhythm:Regular Rate:Normal     Neuro/Psych negative neurological ROS  negative psych ROS   GI/Hepatic negative GI ROS, Neg liver ROS,   Endo/Other  negative endocrine ROSMorbid obesity  Renal/GU negative Renal ROS  negative genitourinary   Musculoskeletal negative musculoskeletal ROS (+)   Abdominal   Peds negative pediatric ROS (+)  Hematology negative hematology ROS (+)   Anesthesia Other Findings   Reproductive/Obstetrics negative OB ROS                           Anesthesia Physical Anesthesia Plan  ASA: II  Anesthesia Plan: General   Post-op Pain Management:    Induction: Intravenous  Airway Management Planned: Oral ETT  Additional Equipment:   Intra-op Plan:   Post-operative Plan: Extubation in OR  Informed Consent: I have reviewed the patients History and Physical, chart, labs and discussed the procedure including the risks, benefits and alternatives for the proposed anesthesia with the patient or authorized representative who has indicated his/her understanding and acceptance.   Dental advisory given  Plan Discussed with: CRNA  Anesthesia Plan Comments:         Anesthesia Quick Evaluation

## 2011-07-01 NOTE — Anesthesia Procedure Notes (Addendum)
Anesthesia Regional Block:  Supraclavicular block  Pre-Anesthetic Checklist: ,, timeout performed, Correct Patient, Correct Site, Correct Laterality, Correct Procedure, Correct Position, site marked, Risks and benefits discussed,  Surgical consent,  Pre-op evaluation,  At surgeon's request and post-op pain management  Laterality: Left  Prep: chloraprep       Needles:  Injection technique: Single-shot  Needle Type: Stimiplex     Needle Length: 10cm 10 cm     Additional Needles:  Procedures: ultrasound guided and nerve stimulator Supraclavicular block Narrative:  Start time: 07/01/2011 12:48 PM Injection made incrementally with aspirations every 5 mL.  Performed by: Personally  Anesthesiologist: Phillips Grout MD  Additional Notes: Risks, benefits and alternative to block explained extensively.  Patient tolerated procedure well, without complications.  Supraclavicular block Procedure Name: Intubation Date/Time: 07/01/2011 1:48 PM Performed by: Iline Oven Pre-anesthesia Checklist: Patient identified, Emergency Drugs available, Suction available and Patient being monitored Patient Re-evaluated:Patient Re-evaluated prior to inductionOxygen Delivery Method: Circle System Utilized Preoxygenation: Pre-oxygenation with 100% oxygen Intubation Type: IV induction Ventilation: Mask ventilation without difficulty Laryngoscope Size: Mac and 4 Grade View: Grade II Tube type: Oral Tube size: 8.0 mm Number of attempts: 1 Airway Equipment and Method: stylet and oral airway Placement Confirmation: ETT inserted through vocal cords under direct vision,  positive ETCO2 and breath sounds checked- equal and bilateral Secured at: 25 cm Tube secured with: Tape Dental Injury: Teeth and Oropharynx as per pre-operative assessment

## 2011-07-01 NOTE — Progress Notes (Signed)
Pt in position for left interscalene block - Dr Acey Lav at bedside- all monitors applied - given O2 per face mask at 8L/min

## 2011-07-02 ENCOUNTER — Encounter (HOSPITAL_BASED_OUTPATIENT_CLINIC_OR_DEPARTMENT_OTHER): Payer: Self-pay | Admitting: Orthopedic Surgery

## 2011-07-02 MED ORDER — KETOROLAC TROMETHAMINE 10 MG PO TABS: 10.0000 mg | ORAL_TABLET | Freq: Four times a day (QID) | ORAL | Status: AC | PRN

## 2011-07-02 MED ORDER — METHOCARBAMOL 500 MG PO TABS: 500.0000 mg | ORAL_TABLET | Freq: Four times a day (QID) | ORAL | Status: AC | PRN

## 2011-07-02 MED ORDER — METHOCARBAMOL 500 MG PO TABS: 500.0000 mg | ORAL_TABLET | Freq: Four times a day (QID) | ORAL | Status: AC

## 2011-07-02 MED ORDER — OXYCODONE-ACETAMINOPHEN 7.5-325 MG PO TABS: 1.0000 | ORAL_TABLET | ORAL | Status: AC | PRN

## 2011-07-02 NOTE — Op Note (Signed)
NAMETANNON, PEERSON                 ACCOUNT NO.:  [id number]  MEDICAL RECORD NO.:  [id number]  LOCATION:  1311                         FACILITY:  [facility]  PHYSICIAN:  Marlowe Kays, M.D.  DATE OF BIRTH:  February 15, 1966  DATE OF PROCEDURE:  07/01/2011 DATE OF DISCHARGE:                              OPERATIVE REPORT   PREOPERATIVE DIAGNOSES: 1. High-grade incomplete tear of the rotator cuff. 2. Acromioclavicular joint arthritis, left shoulder.  POSTOPERATIVE DIAGNOSES: 1. High-grade incomplete tear of the rotator cuff. 2. Acromioclavicular joint arthritis, left shoulder.  OPERATION: 1. Left shoulder arthroscopy with debridement of labrum and inspection     of subscapularis tendon. 2. Open distal clavicle resection. 3. Open anterior acromionectomy with completion of the rotator cuff     tear and repair.  SURGEON:  Marlowe Kays, M.D.  ASSISTANTDruscilla Brownie. Cherlynn June.  ANESTHESIA:  General, preceded by interscalene block.  PATHOLOGY AND JUSTIFICATION FOR PROCEDURE:  Mr. Angie Fava assistance was necessary because of the complexity of the case, with holding and manipulation of the arm and retraction of instruments.  MRI had demonstrated an articular surface tear of the subscapularis, which is what prompted me to do the arthroscopy portion.  The subscapularis appeared to be intact along with the biceps tendon.  I did have enough disruption of the labrum and I felt labral debridement was indicated and thus performed.  The [facility] joint was quite arthritic.  The MRI had demonstrated high-grade partial tear of the supraspinatus tendon, which I did not feel would heal without completing the tear and repairing it.  PROCEDURE IN DETAIL:  Interscalene block by anesthesia, prophylactic antibiotics, satisfied general anesthesia, in the beach chair position on the sliding frame.  Left shoulder girdle was prepped with DuraPrep, draped in sterile field.  Time-out performed.  Anatomy of the  shoulder joint was marked out and posterior soft spot portal, and shoulder anatomy were identified and marked out.  I then infiltrated for hemostatic purposes.  The proposed incision for the distal clavicle resection and rotator cuff repair as well as into the posterior soft spot portal atraumatically and the glenohumeral joint, which was quite inflamed.  The biceps tendon looked normal as did the subscapularis tendon with none of the partial tear depicted on the MRI visible.  He did have enough labral disruption however that we felt this was going to be creating an impingement problem, and consequently I went ahead and I elected to debride the labrum.  I advanced the arthroscope between the subscapularis and biceps tendon and using a switching stick, made an anterior incision over which I placed a metal cannula and then introduced a 4.2 shaver into the joint and debrided down the labrum.  I then made an open incision over the distal clavicle and after demarcating the [facility] joint, I measured centimeter and a half medial and marked the clavicle with cautery at this point.  Undermining anteriorly and posteriorly, and protecting tissue with Brynda Rim.  I then used a micro saw to amputate the distal clavicle.  Minimal spicules of bone were removed with a small rongeur and bone wax was placed over the raw bone.  I then advanced the  incision lateralward and downward and subperiosteal dissection using cutting cautery.  The anterior acromion was identified.  It was quite thick and he has significant impingement problem.  Protecting underlying rotator cuff with a large Cobb elevator, I made my first of several decompressive cuts with the micro saw.  There was a transverse rent in the rotator cuff, which was not iatrogenic.  He also had a very thick subdeltoid bursa.  I incised a lot of the bursa with scissors and completed the rent, making an T out of it with a #15 knife blade, entering the joint.   As depicted on the MRI, a good portion of the rotator cuff fibrous had retracted.  Biceps tendon was intact.  I then used a Stryker 4 strand 5.5 anchor and spliced the delaminated rotator cuff together, bringing the rotator cuff lateral ward.  I then supplemented this with numerous individual sutures using the same FiberWire.  This seemed to give a nice stable repair.  I also took pictures before and after repair for documentation.  We then irrigated both wounds well with sterile saline.  I placed Gelfoam in the distal clavicle resection site and closed the tissues deep with interrupted #1 Vicryl in the fascia over the distal clavicle resection site and the anterior acromion.  Subcutaneous tissue was closed with 2-0 Vicryl, staples in the skin and the 2 portals.  Betadine, Adaptic, dry sterile dressing, and shoulder immobilizer were applied.  He tolerated the procedure well and was taken to recovery room in satisfactory condition with no known complications.          ______________________________ Marlowe Kays, M.D.     JA/MEDQ  D:  07/01/2011  T:  07/02/2011  Job:  [id number]

## 2011-07-02 NOTE — Progress Notes (Signed)
Pt seen for initial OT shoulder evaluation.  See shadow chart for evaluation.  Pt understands all techniques with sling and adls.  Pt safe to d/c home from OT stand point and will follow up with therapy as MD orders at follow up appt. Tory Emerald, [place] [phone]

## 2011-07-02 NOTE — Anesthesia Postprocedure Evaluation (Signed)
  Anesthesia Post-op Note  Patient: Nathaniel Chambers  Procedure(s) Performed: Procedure(s) (LRB): ARTHROSCOPY SHOULDER (Left) ROTATOR CUFF REPAIR SHOULDER OPEN (Left)  Patient Location: PACU  Anesthesia Type: GA combined with regional for post-op pain  Level of Consciousness: awake and alert   Airway and Oxygen Therapy: Patient Spontanous Breathing  Post-op Pain: mild  Post-op Assessment: Post-op Vital signs reviewed, Patient's Cardiovascular Status Stable, Respiratory Function Stable, Patent Airway and No signs of Nausea or vomiting  Post-op Vital Signs: stable  Complications: No apparent anesthesia complications. Some pain in PACU

## 2011-07-03 LAB — POCT HEMOGLOBIN-HEMACUE: Hemoglobin: 15 g/dL (ref 13.0–17.0)

## 2011-07-03 NOTE — Progress Notes (Signed)
Patient ID: Nathaniel Chambers, male   DOB: Jan 26, 1966, 46 y.o.   MRN: [id number] I had discharged him yesterday but because of pain control issues plus intolerance to percocet and being tried successfully on Norco his discharge wsa delayed until today.Given Rx Norco 5-325 #30 with one refill.

## 2011-07-03 NOTE — Progress Notes (Signed)
Pt and spouse verbalized understanding of discharge instructions. Pt assessment has not changed from am.

## 2011-07-23 NOTE — Discharge Summary (Signed)
Nathaniel Chambers, Nathaniel Chambers                 ACCOUNT NO.:  [id number]  MEDICAL RECORD NO.:  [id number]  LOCATION:  1311                         FACILITY:  [facility]  PHYSICIAN:  Marlowe Kays, M.D.  DATE OF BIRTH:  [birth year]  DATE OF ADMISSION:  07/01/2011 DATE OF DISCHARGE:  07/02/2011                              DISCHARGE SUMMARY   ADMITTING DIAGNOSES: 1. High-grade incomplete tear of the rotator cuff. 2. Acromioclavicular joint arthritis.  DISCHARGE DIAGNOSES: 1. High-grade incomplete tear of the rotator cuff. 2. Acromioclavicular joint arthritis.  OPERATION:  On July 01, 2011, the patient underwent: 1. Left shoulder arthroscopy with debridement of labrum and inspection     of subscapularis tendon. 2. Open distal clavicle resection. 3. Open anterior acromionectomy with completion of rotator cuff tear     and repair, D. L. Lancer Thurner-assisted.  BRIEF HISTORY:  This patient had continuing problems with his left shoulder with range of motion as well as the internal and external rotation.  MRI demonstrates an articular surface tear of the subscapularis, which is secondary to the impingement over the cuff itself.  After much discussion of the risks and benefits of surgery, it was decided to go ahead with the above procedure.  COURSE IN THE HOSPITAL:  The patient tolerated the surgical procedure quite well.  He did well postop.  He was able to understand how to wear the sling.  Neurovascularly remain intact in his operative extremity. His vital signs were stable and he felt that he could be maintained in his home environment and arrangements were made for that discharge.  DISCHARGE PLAN:  He will follow up in the office in about 2-3 days for dressing change.  He may wear the sling at all times for protection and comfort.  He will be discharged home on his regular medications for his inhalers.  He is given mild analgesics and Robaxin as a muscle relaxant. He is to call should he have any  problems or questions.    Amar Keenum L. Cherlynn June.   ______________________________ Marlowe Kays, M.D.   DLU/MEDQ  D:  07/22/2011  T:  07/23/2011  Job:  [id number]

## 2011-10-16 ENCOUNTER — Other Ambulatory Visit: Payer: Self-pay | Admitting: Orthopedic Surgery

## 2011-10-16 DIAGNOSIS — M75120 Complete rotator cuff tear or rupture of unspecified shoulder, not specified as traumatic: Secondary | ICD-10-CM

## 2011-10-20 ENCOUNTER — Ambulatory Visit
Admission: RE | Admit: 2011-10-20 | Discharge: 2011-10-20 | Disposition: A | Discharge status: Home / Self Care | Payer: BC Managed Care – PPO | Source: Ambulatory Visit | Attending: Orthopedic Surgery | Admitting: Orthopedic Surgery

## 2011-10-20 DIAGNOSIS — M75120 Complete rotator cuff tear or rupture of unspecified shoulder, not specified as traumatic: Secondary | ICD-10-CM

## 2011-10-20 MED ORDER — IOHEXOL 180 MG/ML  SOLN
15.0000 mL | Freq: Once | INTRAMUSCULAR | Status: AC | PRN
  Administered 2011-10-20: 15 mL via INTRA_ARTICULAR

## 2012-06-30 ENCOUNTER — Ambulatory Visit (INDEPENDENT_AMBULATORY_CARE_PROVIDER_SITE_OTHER): Payer: BC Managed Care – PPO | Admitting: Physician Assistant

## 2012-06-30 ENCOUNTER — Encounter (INDEPENDENT_AMBULATORY_CARE_PROVIDER_SITE_OTHER): Payer: Self-pay

## 2012-06-30 VITALS — BP 140/88 | HR 77 | Temp 97.8°F | Ht 74.0 in | Wt 319.6 lb

## 2012-06-30 DIAGNOSIS — Z4651 Encounter for fitting and adjustment of gastric lap band: Secondary | ICD-10-CM

## 2012-06-30 NOTE — Progress Notes (Signed)
  HISTORY: Nathaniel Chambers is a 47 y.o.male who received an AP-Large lap-band in December 2008 by Dr. Daphine Deutscher. He comes in with 35 lbs of weight gain in the course of 21 months since he was last seen. He says the majority of 2013 was consumed by recovery from shoulder surgery, which got in the way of exercise and therefore increase in weight. He says his band is actually a bit too tight, necessitating liquids to be able to swallow solid food. He wants a bit of fluid removed so he can tolerate nutritious foods without being overly hungry or eating too much.  VITAL SIGNS: Filed Vitals:   06/30/12 1040  BP: 140/88  Pulse: 77  Temp: 97.8 F (36.6 C)    PHYSICAL EXAM: Physical exam reveals a very well-appearing 46 y.o.male in no apparent distress Neurologic: Awake, alert, oriented Psych: Bright affect, conversant Respiratory: Breathing even and unlabored. No stridor or wheezing Abdomen: Soft, nontender, nondistended to palpation. Incisions well-healed. No incisional hernias. Port easily palpated. Extremities: Atraumatic, good range of motion.  ASSESMENT: 47 y.o.  male  s/p AP-Large lap-band.   PLAN: The patient's port was accessed with a 20G Huber needle without difficulty. Clear fluid was aspirated and 1 mL saline was removed from the port to give a total predicted volume of 6 mL. The patient was advised to concentrate on healthy food choices and to avoid slider foods high in fats and carbohydrates. He described the water as going down much easier.

## 2012-06-30 NOTE — Patient Instructions (Signed)
Return in one month. Focus on good food choices as well as physical activity. Return sooner if you have an increase in hunger, portion sizes or weight. Return also for difficulty swallowing, night cough, reflux.   

## 2012-07-28 ENCOUNTER — Encounter (INDEPENDENT_AMBULATORY_CARE_PROVIDER_SITE_OTHER): Payer: BC Managed Care – PPO

## 2012-08-04 ENCOUNTER — Encounter (INDEPENDENT_AMBULATORY_CARE_PROVIDER_SITE_OTHER): Payer: Self-pay

## 2012-08-04 ENCOUNTER — Ambulatory Visit (INDEPENDENT_AMBULATORY_CARE_PROVIDER_SITE_OTHER): Payer: BC Managed Care – PPO | Admitting: Physician Assistant

## 2012-08-04 VITALS — BP 150/88 | HR 65 | Temp 98.6°F | Resp 20 | Ht 74.0 in | Wt 323.6 lb

## 2012-08-04 DIAGNOSIS — Z4651 Encounter for fitting and adjustment of gastric lap band: Secondary | ICD-10-CM

## 2012-08-04 NOTE — Progress Notes (Signed)
  HISTORY: Nathaniel Chambers is a 47 y.o.male who received an AP-Large lap-band in December 2008 by Dr. Daphine Deutscher. He's gained 4 lbs since removal of 1 mL fluid at his last visit. He is having no further symptoms of obstruction. He does note however that he's having increased portion sizes and his hunger has increased. He is seeking information about support groups as well.  VITAL SIGNS: Filed Vitals:   08/04/12 1508  BP: 150/88  Pulse: 65  Temp: 98.6 F (37 C)  Resp: 20    PHYSICAL EXAM: Physical exam reveals a very well-appearing 47 y.o.male in no apparent distress Neurologic: Awake, alert, oriented Psych: Bright affect, conversant Respiratory: Breathing even and unlabored. No stridor or wheezing Abdomen: Soft, nontender, nondistended to palpation. Incisions well-healed. No incisional hernias. Port easily palpated. Extremities: Atraumatic, good range of motion.  ASSESMENT: 47 y.o.  male  s/p AP-Large lap-band.   PLAN: As he is giving signs of being in the yellow zone, an adjustment appears medically necessary to help him to lose weight. The patient's port was accessed with a 20G Huber needle without difficulty. 6.0 mL clear fluid was aspirated and 0.25 mL saline was added to the port to give a total predicted volume of 6.25 mL. The patient was able to swallow water without difficulty following the procedure and was instructed to take clear liquids for the next 24-48 hours and advance slowly as tolerated. I've referred him to Okey Regal, our bariatric coordinator, for information regarding support groups in the area. He's going to consult with his nutritionist regarding overall food intake. We'll have him return in one month or sooner if necessary.

## 2012-08-04 NOTE — Patient Instructions (Signed)
Take clear liquids tonight. Thin protein shakes are ok to start tomorrow morning. Slowly advance your diet thereafter. Call us if you have persistent vomiting or regurgitation, night cough or reflux symptoms. Return as scheduled or sooner if you notice no changes in hunger/portion sizes.  

## 2012-09-01 ENCOUNTER — Encounter (INDEPENDENT_AMBULATORY_CARE_PROVIDER_SITE_OTHER): Payer: BC Managed Care – PPO

## 2012-09-29 ENCOUNTER — Encounter (INDEPENDENT_AMBULATORY_CARE_PROVIDER_SITE_OTHER): Payer: BC Managed Care – PPO

## 2012-11-10 ENCOUNTER — Encounter (INDEPENDENT_AMBULATORY_CARE_PROVIDER_SITE_OTHER): Payer: BC Managed Care – PPO

## 2013-02-03 ENCOUNTER — Other Ambulatory Visit: Payer: Self-pay | Admitting: Family Medicine

## 2013-02-03 DIAGNOSIS — R109 Unspecified abdominal pain: Secondary | ICD-10-CM

## 2013-02-06 ENCOUNTER — Ambulatory Visit
Admission: RE | Admit: 2013-02-06 | Discharge: 2013-02-06 | Disposition: A | Discharge status: Home / Self Care | Payer: BC Managed Care – PPO | Source: Ambulatory Visit | Attending: Family Medicine | Admitting: Family Medicine

## 2013-02-06 DIAGNOSIS — R109 Unspecified abdominal pain: Secondary | ICD-10-CM

## 2013-06-25 DIAGNOSIS — G5710 Meralgia paresthetica, unspecified lower limb: Secondary | ICD-10-CM

## 2013-06-25 HISTORY — DX: Meralgia paresthetica, unspecified lower limb: G57.10

## 2013-06-26 DIAGNOSIS — D481 Neoplasm of uncertain behavior of connective and other soft tissue: Secondary | ICD-10-CM

## 2013-06-26 DIAGNOSIS — D48119 Desmoid tumor of unspecified site: Secondary | ICD-10-CM

## 2013-06-26 HISTORY — DX: Neoplasm of uncertain behavior of connective and other soft tissue: D48.1

## 2013-06-26 HISTORY — DX: Desmoid tumor of unspecified site: D48.119

## 2013-06-30 ENCOUNTER — Other Ambulatory Visit: Payer: Self-pay | Admitting: General Surgery

## 2013-06-30 HISTORY — PX: OTHER SURGICAL HISTORY: SHX169

## 2013-09-15 ENCOUNTER — Ambulatory Visit
Admission: RE | Admit: 2013-09-15 | Discharge: 2013-09-15 | Disposition: A | Discharge status: Home / Self Care | Payer: BC Managed Care – PPO | Source: Ambulatory Visit | Attending: Family Medicine | Admitting: Family Medicine

## 2013-09-15 ENCOUNTER — Encounter (INDEPENDENT_AMBULATORY_CARE_PROVIDER_SITE_OTHER): Payer: Self-pay

## 2013-09-15 ENCOUNTER — Other Ambulatory Visit: Payer: Self-pay | Admitting: Family Medicine

## 2013-09-15 DIAGNOSIS — R109 Unspecified abdominal pain: Secondary | ICD-10-CM

## 2013-09-15 MED ORDER — IOHEXOL 300 MG/ML  SOLN
125.0000 mL | Freq: Once | INTRAMUSCULAR | Status: AC | PRN
  Administered 2013-09-15: 125 mL via INTRAVENOUS

## 2013-10-17 ENCOUNTER — Encounter: Payer: Self-pay | Admitting: Radiation Oncology

## 2013-10-17 NOTE — Progress Notes (Signed)
06/26/13 [facility]: Patient presented with new suspected neoplasm of left posterior leg first noticed by physician on MRI. Painful, not aggravated by activity, prolonged sitting causes pain.  06/30/13 Radical resection of soft tissue thigh tumor:   Physical therapy post op, completed last week. Married, 3 daughters. To return to work next week, works at a computer. Seeing neurologist July 6th.  Pt having sensitivity of skin of bilateral legs, "deep sharp pains". He takes Lyrica,

## 2013-10-19 ENCOUNTER — Ambulatory Visit
Admission: RE | Admit: 2013-10-19 | Discharge: 2013-10-19 | Disposition: A | Discharge status: Home / Self Care | Payer: BC Managed Care – PPO | Source: Ambulatory Visit | Attending: Radiation Oncology | Admitting: Radiation Oncology

## 2013-10-19 ENCOUNTER — Encounter: Payer: Self-pay | Admitting: Radiation Oncology

## 2013-10-19 ENCOUNTER — Encounter: Payer: Self-pay | Admitting: *Deleted

## 2013-10-19 VITALS — BP 141/92 | HR 84 | Temp 98.7°F | Resp 20 | Ht 74.0 in | Wt 335.0 lb

## 2013-10-19 DIAGNOSIS — Z87891 Personal history of nicotine dependence: Secondary | ICD-10-CM | POA: Insufficient documentation

## 2013-10-19 DIAGNOSIS — D481 Neoplasm of uncertain behavior of connective and other soft tissue: Secondary | ICD-10-CM

## 2013-10-19 DIAGNOSIS — Z9884 Bariatric surgery status: Secondary | ICD-10-CM | POA: Insufficient documentation

## 2013-10-19 DIAGNOSIS — Z510 Encounter for antineoplastic radiation therapy: Secondary | ICD-10-CM | POA: Diagnosis not present

## 2013-10-19 DIAGNOSIS — J45909 Unspecified asthma, uncomplicated: Secondary | ICD-10-CM | POA: Insufficient documentation

## 2013-10-19 DIAGNOSIS — D4819 Other specified neoplasm of uncertain behavior of connective and other soft tissue: Secondary | ICD-10-CM

## 2013-10-19 DIAGNOSIS — C4920 Malignant neoplasm of connective and soft tissue of unspecified lower limb, including hip: Secondary | ICD-10-CM

## 2013-10-19 HISTORY — DX: Neoplasm of uncertain behavior of connective and other soft tissue: D48.1

## 2013-10-19 HISTORY — DX: Pneumonia, unspecified organism: J18.9

## 2013-10-19 NOTE — Progress Notes (Signed)
[facility] NEW PATIENT EVALUATION  Name: Nathaniel Chambers MRN: [id number]  Date:   10/19/2013           DOB: 24-May-1965  Status: outpatient   CC: [name], MD  Ward, Julieta Bellini., MD    REFERRING PHYSICIAN: Ward, Julieta Bellini., MD   DIAGNOSIS:  Desmoid tumor/aggressive fibromatosis  HISTORY OF PRESENT ILLNESS:  Nathaniel Chambers is a 48 y.o. male who is seen today through the courtesy of Dr. Gwyndolyn Saxon Ward for evaluation of his desmoid tumor. The patient felt that he had a "pulled muscle" along his left thigh in January 2015. He was seen at [facility] where he saw Dr. Drema Dallas. He obtained a MRI scan which showed a left posterior thigh mass. He was referred to Dr. Marquette Saa in [place]. I believe that he had a repeat MRI scan which showed an 11 x 25 cm mass extending medially and posteriorly to the femoral neck and distally into the hamstrings. He was taken to the OR on 06/30/2013 where he underwent  radical resection of a the soft tissue thigh tumor measuring 19 x 12 cm and resection of a complex posterior buttocks mass measuring 10 x 15 cm through a buttock extension of a thigh incision with the final incision measuring 55 cm. The masses were discontinuous. There was soft tissue rearrangements including the biceps femoris, semimembranosus and semi-tendinosis local muscle transfer. On review of his pathology he was found to have a desmoid tumor from the left posterior hip measuring 11.3 cm, involving the medial, distal, deep, and superficial margins and within 0.1 cm of the proximal margin. The left thigh mass appears histologically similar and measured 18 cm. This involved the proximal and deep margins, focally. Comment is made that there were also less cellular regions and foci of edema, myxoid changes and hyalinization such that the differential diagnosis would also include a low-grade fibro- myxoid sarcoma. He is doing well postoperatively from a  rehabilitation standpoint although he does have hypersensitivity of the skin along the anterior thighs. Interestingly, this began along the right side first and is worse on the right compared to the left. He had rehabilitation at [facility]. He is currently on Lyrica. He is scheduled for consultation with Dr. Posey Pronto of [facility] on July 6. He had a recent episode of 2 sigmoid diverticulitis for which she underwent a CT scan of the abdomen and pelvis.  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     PAST SURGICAL HISTORY:  Past Surgical History  Procedure Laterality Date  . Laparoscopic gastric banding  04-26-2007  . Repair recurrent right inguinal hernia  10-22-2008  . Surg. for undescended testicles/ bilateral inguinal hernia repair  AGE 26 MON OLD  . Left shoulder surg  2000    rotator cuff  . Hernia repair  08/2008    rt inguinal  . Shoulder arthroscopy  01/2009    rt   . Shoulder arthroscopy  07/01/2011    Procedure: ARTHROSCOPY SHOULDER;  Surgeon: Magnus Sinning, MD;  Location: [facility];  Service: Orthopedics;  Laterality: Left;  WITH LABRAL DEBRIDEMENT  . Shoulder open rotator cuff repair  07/01/2011    Procedure: ROTATOR CUFF REPAIR SHOULDER OPEN;  Surgeon: Magnus Sinning, MD;  Location: [place];  Service: Orthopedics;  Laterality: Left;  OPEN DISTAL CLAVICLE REPAIR  . Vasectomy  FAMILY HISTORY: family history includes Cancer in his mother; Heart disease in his father. His father died of cardiac disease and 16. His mother is alive and well at 42.   SOCIAL HISTORY:  reports that he quit smoking about 25 years ago. He has quit using smokeless tobacco. He reports that he does not drink alcohol or use illicit drugs.   Married, 3 daughters, twins a 14 and a 32 year old. He works as a Freight forwarder for a [employer], manufacturing systems.   ALLERGIES: Penicillins; Solu-medrol; and Oxycodone   MEDICATIONS:  Current Outpatient Prescriptions  Medication Sig Dispense Refill  . albuterol (PROVENTIL HFA;VENTOLIN HFA) 108 (90 BASE) MCG/ACT inhaler Inhale 2 puffs into the lungs every 6 (six) hours as needed. SHORTNESS OF BREATH/ WHEEZING      . albuterol (PROVENTIL) (2.5 MG/3ML) 0.083% nebulizer solution Take 2.5 mg by nebulization every 6 (six) hours as needed.      . Ergocalciferol (VITAMIN D2) 2000 UNITS TABS Take by mouth.      . Fluticasone-Salmeterol (ADVAIR) 250-50 MCG/DOSE AEPB Inhale 1 puff into the lungs every 12 (twelve) hours.      . pregabalin (LYRICA) 75 MG capsule Take 75 mg by mouth 2 (two) times daily.      . tadalafil (CIALIS) 10 MG tablet Take 10 mg by mouth daily as needed for erectile dysfunction.       No current facility-administered medications for this encounter.     REVIEW OF SYSTEMS:  Pertinent items are noted in HPI.    PHYSICAL EXAM:  height is 6\' 2"  (1.88 m) and weight is 335 lb (151.955 kg). His oral temperature is 98.7 F (37.1 C). His blood pressure is 141/92 and his pulse is 84. His respiration is 20.   Alert and oriented. Head and neck examination: Grossly unremarkable. Nodes: Without palpable cervical or supraclavicular lymphadenopathy. Chest: Lungs clear. Back: Without spinal or CVA tenderness. Pelvis/extremities: There is a  surgical scar extending from just above the level of  the natal cleft along the left mid buttock down along the mid posterior thigh. There is a mild volume defect. The scar measures approximately 55 cm. No masses are appreciated. There is no distal lower extremity edema. The patient walks with a cane.   LABORATORY DATA:  Lab Results  Component Value Date   WBC 13.6* 04/27/2007   HGB 15.0 07/01/2011   HCT 44.5 04/27/2007   MCV 87.5 04/27/2007   PLT 339 04/27/2007   Lab Results  Component Value Date   NA 137 04/26/2007   K 3.9 04/26/2007   CL 103  04/26/2007   CO2 26 04/26/2007   Lab Results  Component Value Date   ALT 28 04/26/2007   AST 23 04/26/2007   ALKPHOS 60 04/26/2007   BILITOT 1.0 04/26/2007      IMPRESSION: Desmoid tumor/aggressive fibromatosis.  I reviewed the 2015 NCCN guidelines with the patient and his wife. I agree with Dr. Leonides Schanz that it  is certainly reasonable to assess the biological behavior of his desmoid tumor which has probably been present for many years. As noted above, he does have positive surgical margins which will almost certainly result in a future recurrence. Management options at this time include close observation with followup MRI scans, postoperative radiation therapy, or consideration of systemic therapy. Again, I feel that it is perfectly reasonable for him to be observed and offer him surgery and definitive radiation therapy at a later date. Of note is that control rates are reasonably good  with radiation therapy alone even in the setting of gross disease. We briefly discussed the potential acute and late toxicities of radiation therapy. A dose of approximately 5000 cGy is prescribed with generous margins.   PLAN: As discussed above. I understand that he is scheduled for a followup MRI scan in November 2015.  I've not scheduled the patient for a formal followup visit, and I ask that Dr. Leonides Schanz contact me if and when he would like to consider or proceed with radiation therapy at a later date. I ask that Dr. Leonides Schanz keep me posted on his progress. I spent 60 minutes minutes face to face with the patient and more than 50% of that time was spent in counseling and/or coordination of care.

## 2013-10-19 NOTE — Progress Notes (Signed)
[facility] Psychosocial Distress Screening Clinical Social Work  Clinical Social Work was referred by distress screening protocol.  The patient scored a 8 on the Psychosocial Distress Thermometer which indicates severe distress. Clinical Social Worker met with pt and his wife to assess for distress and other psychosocial needs. Pt had physical concerns addressed by MD today. CSW discussed with pt his concerns re. returning to work and problem solved with pt. He is planning to do some half days and just a few hours at actual work and then can work from home. He feels this will work, it is just getting started that is distressing to him. Pt has good support from family and his wife. CSW provided ADR packet and went through packet with pt and wife. They plan to get this notarized at their bank due to not returning for another appt. CSW discussed assistance available if pt were in active treatment, but currently is not in treatment. Pt felt bills would be doable once he returns to work.   ONCBCN DISTRESS SCREENING 10/19/2013  Screening Type Initial Screening  Elta Gu[place] the number that describes how much distress you have been experiencing in the past week 8  Practical problem type Insurance;Work/school  Emotional problem type Depression;Nervousness/Anxiety;Adjusting to illness  Physical Problem type Pain;Sleep/insomnia;Getting around;Breathing;Constipation/diarrhea;Swollen arms/legs  Other finances, lists pain as most distressing, spoke w/Grier, SW. she will see pt today.    Clinical Social Worker follow up needed: no  Pt felt less distressed after meeting with [place] and MD. He felt his concerns and issues were fully addressed. Pt has no other psychosocial needs currently. He is aware of resources if he has other needs that arise.   Loren Racer, LCSW Clinical Social Worker [facility] Wednesday, Thursday and Friday Phone: [phone] Fax:  [phone]

## 2013-10-19 NOTE — Progress Notes (Signed)
Please see the Nurse Progress Note in the MD Initial Consult Encounter for this patient. 

## 2013-10-30 ENCOUNTER — Encounter: Payer: Self-pay | Admitting: Neurology

## 2013-10-30 ENCOUNTER — Ambulatory Visit (INDEPENDENT_AMBULATORY_CARE_PROVIDER_SITE_OTHER): Payer: BC Managed Care – PPO | Admitting: Neurology

## 2013-10-30 VITALS — BP 140/90 | HR 96 | Ht 74.8 in | Wt 333.6 lb

## 2013-10-30 DIAGNOSIS — G5711 Meralgia paresthetica, right lower limb: Secondary | ICD-10-CM

## 2013-10-30 DIAGNOSIS — G5712 Meralgia paresthetica, left lower limb: Secondary | ICD-10-CM

## 2013-10-30 DIAGNOSIS — G5710 Meralgia paresthetica, unspecified lower limb: Secondary | ICD-10-CM

## 2013-10-30 MED ORDER — LIDOCAINE 5 % EX OINT: TOPICAL_OINTMENT | CUTANEOUS | Status: DC

## 2013-10-30 MED ORDER — GABAPENTIN 300 MG PO CAPS: 300.0000 mg | ORAL_CAPSULE | Freq: Every day | ORAL | Status: DC

## 2013-10-30 NOTE — Progress Notes (Signed)
Note faxed to both providers.

## 2013-10-30 NOTE — Patient Instructions (Addendum)
Meralgia paresthetica:   1. Start neurontin 300mg  at bedtime  2. Take ibuprofen 600mg  three times daily as needed  3. Apply lidocaine ointment to lateral thigh as needed 4.  Avoid tight fitting clothing or belts  5. Weight loss encouraged 6. Return to clinic in 39-months

## 2013-10-30 NOTE — Progress Notes (Signed)
[facility] Clinic Note - Initial Visit   Date: 10/30/2013  Nathaniel Chambers MRN: [id number] DOB: 08/29/1965   Dear Dr Inda Merlin:  Thank you for your kind referral of Nathaniel Chambers for consultation of bilateral thigh paresthesias. Although his history is well known to you, please allow [place] to reiterate it for the purpose of our medical record. The patient was accompanied to the clinic by wife who also provides collateral information.     History of Present Illness: Nathaniel Chambers is a 48 y.o. 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    Since 2014, he started feeling an abnormality behind his thigh and was eventually found to have found to have a mass involving the posterior thigh.  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 Gwyndolyn Saxon Ward in [place].  A few days following surgery, he developed numbness involving the RIGHT anterolateral thigh.  Symptoms have worsened over the past several months and now he has extreme sensitivity with sharp pain involving the left thigh. Light pressure, air, and even bed sheets causes severe pain. He tried on amitriptyline and Lyrica which did not help pain, but made him sleepy.  He was taking ibuprofen 600mg  following surgery for a brief time, but since stopping it, he feels that pain may have worsened.  He also reports having sharp pain involving the left thigh around mid-May.  He denies any associated weakness, back pain, bowel/bladder incontinence.    Prior to surgery, he had no numbness/tingling of the thighs.  He has gained 30lb following surgery.  He returned to work last week and wore a belt at that time, but otherwise denies wearing any constrictive clothing.      Out-side paper records, electronic  medical record, and images have been reviewed where available and summarized as:  CT abdomen pelvis with contrast 09/15/2013: Acute sigmoid diverticulitis.  No drainable fluid collection/abscess. No free air.  MRA head 10/31/2008: 1. Possible moderate 50% stenosis involving the right  vertebrobasilar junction.  2. No occlusions, dissections or aneurysms seen on the study.  MRI brain wo contrast 10/31/2008: 1. No evidence of acute ischemia.  2. Abnormal marrow signal involving the upper one third of the clivus. Differential considerations include marrow changes related  to anemia, smoking, or a neoplastic infiltrative process. Clinical correlation and further evaluation with a dedicated CT scan through the cranial skull base is suggested.  3. Moderate inflammatory thickening of the mucosa in the ethmoids and the maxillary sinuses.    Past Medical History  Diagnosis Date  . Asthma   . Pneumonia     history of  . 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 Laparoscopic gastric banding  04-26-2007  . Repair recurrent right inguinal hernia  10-22-2008  . Surg. for undescended testicles/ bilateral inguinal hernia repair  AGE 50 MON OLD  . Left shoulder surg  2000    rotator cuff  . Hernia repair  08/2008    rt inguinal  . Shoulder arthroscopy  01/2009    rt   . Shoulder arthroscopy  07/01/2011    Procedure: ARTHROSCOPY SHOULDER;  Surgeon: Magnus Sinning, MD;  Location: [facility];  Service: Orthopedics;  Laterality: Left;  WITH LABRAL DEBRIDEMENT  . Shoulder open  rotator cuff repair  07/01/2011    Procedure: ROTATOR CUFF REPAIR SHOULDER OPEN;  Surgeon: Magnus Sinning, MD;  Location: [facility];  Service: Orthopedics;  Laterality: Left;  OPEN DISTAL CLAVICLE REPAIR  . Vasectomy    . Leg surgery      cancer     Medications:  Current Outpatient Prescriptions on File Prior to Visit  Medication Sig Dispense  Refill  . albuterol (PROVENTIL HFA;VENTOLIN HFA) 108 (90 BASE) MCG/ACT inhaler Inhale 2 puffs into the lungs every 6 (six) hours as needed. SHORTNESS OF BREATH/ WHEEZING      . albuterol (PROVENTIL) (2.5 MG/3ML) 0.083% nebulizer solution Take 2.5 mg by nebulization every 6 (six) hours as needed.      . Ergocalciferol (VITAMIN D2) 2000 UNITS TABS Take by mouth.      . Fluticasone-Salmeterol (ADVAIR) 250-50 MCG/DOSE AEPB Inhale 1 puff into the lungs every 12 (twelve) hours.      . pregabalin (LYRICA) 75 MG capsule Take 75 mg by mouth 2 (two) times daily.      . tadalafil (CIALIS) 10 MG tablet Take 10 mg by mouth daily as needed for erectile dysfunction.       No current facility-administered medications on file prior to visit.    Allergies:  Allergies  Allergen Reactions  . Penicillins Shortness Of Breath    Swelling of uvula  . Solu-Medrol [Methylprednisolone Acetate] Anaphylaxis  . Oxycodone Nausea And Vomiting    Family History: Family History  Problem Relation Age of Onset  . Heart disease Father   . Cancer Mother     kidney, spleen    Social History: History   Social History  . Marital Status: Married    Spouse Name: N/A    Number of Children: N/A  . Years of Education: N/A   Occupational History  . Not on file.   Social History Main Topics  . Smoking status: Former Smoker -- 0.25 packs/day for 1 years    Quit date: 06/25/1988  . Smokeless tobacco: Former Systems developer     Comment: smoked socially  . Alcohol Use: No  . Drug Use: No  . Sexual Activity: Not on file   Other Topics Concern  . Not on file   Social History Narrative  . No narrative on file    Review of Systems:  CONSTITUTIONAL: No fevers, chills, night sweats, + gained 30lb  EYES: No visual changes or eye pain ENT: No hearing changes.  No history of nose bleeds.   RESPIRATORY: No cough, wheezing and shortness of breath.   CARDIOVASCULAR: Negative for chest pain, and palpitations.   GI: Negative for  abdominal discomfort, blood in stools or black stools.    GU:  No history of incontinence.   MUSCLOSKELETAL: No history of joint pain or swelling.  No myalgias.   SKIN: Negative for lesions, rash, and itching.   HEMATOLOGY/ONCOLOGY: Negative for prolonged bleeding, bruising easily, and swollen nodes.   ENDOCRINE: Negative for cold or heat intolerance, polydipsia or goiter.   PSYCH:  No depression or anxiety symptoms.   NEURO: As Above.   Vital Signs:  BP 140/90  Pulse 96  Ht 6' 2.8" (1.9 m)  Wt 333 lb 9 oz (151.303 kg)  BMI 41.91 kg/m2  SpO2 96%   General Medical Exam:   General:  Well appearing, comfortable.   Eyes/ENT: see cranial nerve examination.   Neck: No masses appreciated.  Full range of motion without tenderness.   Respiratory:  Good  air entry bilaterally.   Cardiac:  Regular rate and rhythm, no murmur.     Extremities:  No deformities, edema, or skin discoloration. No pain to palpation of the left foot.   Skin:  Skin color, texture, turgor normal. No rashes or lesions.  Neurological Exam: MENTAL STATUS including orientation to time, place, person, recent and remote memory, attention span and concentration, language, and fund of knowledge is normal.  Speech is not dysarthric.  CRANIAL NERVES: II:  No visual field defects.  Unremarkable fundi.   III-IV-VI: Pupils equal round and reactive to light.  Normal conjugate, extra-ocular eye movements in all directions of gaze.  No nystagmus.  Subtle left ptosis(old) .   V:  Normal facial sensation.   VII:  Normal facial symmetry and movements.   VIII:  Normal hearing and vestibular function.   IX-X:  Normal palatal movement.   XI:  Normal shoulder shrug and head rotation.   XII:  Normal tongue strength and range of motion, no deviation or fasciculation.  MOTOR:  No atrophy, fasciculations or abnormal movements.  No pronator drift.  Tone is normal.    Right Upper Extremity:    Left Upper Extremity:    Deltoid  5/5   Deltoid   5/5   Biceps  5/5   Biceps  5/5   Triceps  5/5   Triceps  5/5   Wrist extensors  5/5   Wrist extensors  5/5   Wrist flexors  5/5   Wrist flexors  5/5   Finger extensors  5/5   Finger extensors  5/5   Finger flexors  5/5   Finger flexors  5/5   Dorsal interossei  5/5   Dorsal interossei  5/5   Abductor pollicis  5/5   Abductor pollicis  5/5   Tone (Ashworth scale)  0  Tone (Ashworth scale)  0   Right Lower Extremity:    Left Lower Extremity:    Hip flexors  5/5   Hip flexors  5/5   Hip extensors  5/5   Hip extensors  5/5   Knee flexors  5/5   Knee flexors  5/5   Knee extensors  5/5   Knee extensors  5/5   Dorsiflexors  5/5   Dorsiflexors  5/5   Plantarflexors  5/5   Plantarflexors  5/5   Toe extensors  5/5   Toe extensors  5/5   Toe flexors  5/5   Toe flexors  5/5   Tone (Ashworth scale)  0  Tone (Ashworth scale)  0   MSRs:  Right                                                                 Left brachioradialis 2+  brachioradialis 2+  biceps 2+  biceps 2+  triceps 2+  triceps 2+  patellar 2+  patellar 2+  ankle jerk 1+  ankle jerk 1+  Hoffman no  Hoffman no  plantar response down  plantar response down   SENSORY:  Diminished sensation to temperature and pin prick over the anterolateral thigh (R >L), otherwise normal and symmetric perception of light touch, pinprick, vibration, and proprioception.    COORDINATION/GAIT: Normal finger-to- nose-finger and heel-to-shin.  Intact rapid alternating movements bilaterally.  Able to  rise from a chair without using arms.  Gait narrow based and stable. He has pain over the dorsum of the left foot when standing on heels.  Tandem and heel walking intact.    IMPRESSION: Mr. Biedermann is a 48 year-old gentleman presenting for evaluation of bilateral thigh paresthesias, worse on the right, following resection of posterior thigh desmoid fibromatosis tumor in March 2015.  His examination is consistent with diminished sensation in all modalities over  the anterolateral thigh bilaterally, involving a larger area on the right side. 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 Management is conservative with NSAIDs and avoidance of repetitive trauma to this region. In his case, he reports gaining 30lb following post-operative, which is likely contributing to symptomology.  I explained that if symptoms do not improve, imaging of the pelvis and/or nerve ultrasound may be consider to be sure there is no structural abnormality of the nerve causing symptoms.  I have discussed the diagnosis, pathophysiology, management plan, and risks and benefits of medications, and prognosis.    PLAN/RECOMMENDATIONS:  1. Start neurontin 300mg  at bedtime  2. Take ibuprofen 600mg  three times daily as needed  3. Apply lidocaine ointment to lateral thigh as needed 4. Avoid tight fitting clothing or belts  5. Weight loss encouraged 6. Return to clinic in 41-months, or sooner as needed   The duration of this appointment visit was 50 minutes of face-to-face time with the patient.  Greater than 50% of this time was spent in counseling, explanation of diagnosis, planning of further management, and coordination of care.   Thank you for allowing me to participate in patient's care.  If I can answer any additional questions, I would be pleased to do so.    Sincerely,    Zaryah Seckel K. Posey Pronto, DO

## 2013-11-01 ENCOUNTER — Telehealth: Payer: Self-pay | Admitting: Neurology

## 2013-11-01 NOTE — Telephone Encounter (Signed)
Pt called requesting to speak to a nurse regarding his meds. C/b [phone]

## 2013-11-02 MED ORDER — LIDOCAINE 5 % EX OINT: TOPICAL_OINTMENT | CUTANEOUS | Status: DC

## 2013-11-02 NOTE — Telephone Encounter (Signed)
Patient said the tubes of Lidocaine are very small.  In one day, he uses 1/2 a tube.  Do they come in bigger sizes or can he have more called in.

## 2013-11-02 NOTE — Telephone Encounter (Signed)
Rx for 50g tube of lidocaine ointment sent.  Please notify patient.  Bryse Blanchette K. Posey Pronto, DO

## 2013-11-02 NOTE — Telephone Encounter (Signed)
Patient notified

## 2013-11-02 NOTE — Telephone Encounter (Signed)
Pt needs to talk to someone please call [phone]

## 2013-11-07 ENCOUNTER — Emergency Department (HOSPITAL_COMMUNITY)
Admission: EM | Admit: 2013-11-07 | Discharge: 2013-11-07 | Disposition: A | Discharge status: Home / Self Care | Payer: BC Managed Care – PPO | Attending: Emergency Medicine | Admitting: Emergency Medicine

## 2013-11-07 ENCOUNTER — Encounter (HOSPITAL_COMMUNITY): Payer: Self-pay | Admitting: Emergency Medicine

## 2013-11-07 DIAGNOSIS — R197 Diarrhea, unspecified: Secondary | ICD-10-CM | POA: Insufficient documentation

## 2013-11-07 DIAGNOSIS — Z87891 Personal history of nicotine dependence: Secondary | ICD-10-CM | POA: Insufficient documentation

## 2013-11-07 DIAGNOSIS — IMO0002 Reserved for concepts with insufficient information to code with codable children: Secondary | ICD-10-CM | POA: Insufficient documentation

## 2013-11-07 DIAGNOSIS — Z8589 Personal history of malignant neoplasm of other organs and systems: Secondary | ICD-10-CM | POA: Insufficient documentation

## 2013-11-07 DIAGNOSIS — J45909 Unspecified asthma, uncomplicated: Secondary | ICD-10-CM | POA: Insufficient documentation

## 2013-11-07 DIAGNOSIS — R1084 Generalized abdominal pain: Secondary | ICD-10-CM | POA: Insufficient documentation

## 2013-11-07 DIAGNOSIS — Z791 Long term (current) use of non-steroidal anti-inflammatories (NSAID): Secondary | ICD-10-CM | POA: Insufficient documentation

## 2013-11-07 DIAGNOSIS — Z8669 Personal history of other diseases of the nervous system and sense organs: Secondary | ICD-10-CM | POA: Insufficient documentation

## 2013-11-07 DIAGNOSIS — Z8701 Personal history of pneumonia (recurrent): Secondary | ICD-10-CM | POA: Insufficient documentation

## 2013-11-07 DIAGNOSIS — Z79899 Other long term (current) drug therapy: Secondary | ICD-10-CM | POA: Insufficient documentation

## 2013-11-07 DIAGNOSIS — Z880 Allergy status to penicillin: Secondary | ICD-10-CM | POA: Insufficient documentation

## 2013-11-07 LAB — BASIC METABOLIC PANEL
ANION GAP: 14 (ref 5–15)
BUN: 7 mg/dL (ref 6–23)
CALCIUM: 9.2 mg/dL (ref 8.4–10.5)
CO2: 22 mEq/L (ref 19–32)
CREATININE: 0.94 mg/dL (ref 0.50–1.35)
Chloride: 102 mEq/L (ref 96–112)
GFR calc Af Amer: >90 mL/min (ref >90)
Glucose, Bld: 120 mg/dL — ABNORMAL HIGH (ref 70–99)
Potassium: 3.7 mEq/L (ref 3.7–5.3)
Sodium: 138 mEq/L (ref 137–147)

## 2013-11-07 LAB — CBC WITH DIFFERENTIAL/PLATELET
BASOS PCT: 1 % (ref 0–1)
Basophils Absolute: 0.1 10*3/uL (ref 0.0–0.1)
EOS ABS: 0.2 10*3/uL (ref 0.0–0.7)
Eosinophils Relative: 2 % (ref 0–5)
HCT: 41.3 % (ref 39.0–52.0)
Hemoglobin: 13.6 g/dL (ref 13.0–17.0)
Lymphocytes Relative: 22 % (ref 12–46)
Lymphs Abs: 2.4 10*3/uL (ref 0.7–4.0)
MCH: 27.3 pg (ref 26.0–34.0)
MCHC: 32.9 g/dL (ref 30.0–36.0)
MCV: 82.8 fL (ref 78.0–100.0)
Monocytes Absolute: 0.4 10*3/uL (ref 0.1–1.0)
Monocytes Relative: 4 % (ref 3–12)
NEUTROS ABS: 7.7 10*3/uL (ref 1.7–7.7)
NEUTROS PCT: 71 % (ref 43–77)
Platelets: 302 10*3/uL (ref 150–400)
RBC: 4.99 MIL/uL (ref 4.22–5.81)
RDW: 15.2 % (ref 11.5–15.5)
WBC: 10.7 10*3/uL — ABNORMAL HIGH (ref 4.0–10.5)

## 2013-11-07 MED ORDER — DICYCLOMINE HCL 20 MG PO TABS: 20.0000 mg | ORAL_TABLET | Freq: Two times a day (BID) | ORAL | Status: DC

## 2013-11-07 MED ORDER — MORPHINE SULFATE 4 MG/ML IJ SOLN
4.0000 mg | Freq: Once | INTRAMUSCULAR | Status: AC
  Administered 2013-11-07: 4 mg via INTRAVENOUS
  Filled 2013-11-07: qty 1

## 2013-11-07 MED ORDER — DICYCLOMINE HCL 10 MG/ML IM SOLN
20.0000 mg | Freq: Once | INTRAMUSCULAR | Status: AC
  Administered 2013-11-07: 20 mg via INTRAMUSCULAR
  Filled 2013-11-07: qty 2

## 2013-11-07 MED ORDER — HYDROMORPHONE HCL PF 1 MG/ML IJ SOLN: 1.0000 mg | Freq: Once | INTRAMUSCULAR | Status: DC

## 2013-11-07 NOTE — Discharge Instructions (Signed)
Call your colonoscopy center for further evaluation and management of bowel prep prior to your colonoscopy today. Call for a follow up appointment with a Family or Primary Care Provider.  Return if Symptoms worsen.   Take medication as prescribed.

## 2013-11-07 NOTE — ED Notes (Signed)
Pt complains of severe abd pain since about 10 pm, he's scheduled for a colonoscopy at 2pm today, he drank the contrast and has had diarrhea from that.

## 2013-11-07 NOTE — ED Provider Notes (Signed)
CSN: [id number]     Arrival date & time 11/07/13  6294 History   First MD Initiated Contact with Patient 11/07/13 0358     Chief Complaint  Patient presents with  . Abdominal Pain     (Consider location/radiation/quality/duration/timing/severity/associated sxs/prior Treatment) HPI Comments: The patient is a 48 year old male with a past medical history of gastric bandning, hernia repair, presenting to emergency room chief complaint of abdominal pain since 2100. The patient reports generalized abdominal cramping, rated 12/10, intermittent, no relieving factors.The patient reports onset of pain after starting Golytely medication for his colonoscopy, scheduled for later today. He reports onset of discomfort after taking Dulcolax for his colonoscopy tomorrow. He reports 4 episodes of loose stool. Reports similar discomfort with dulcolax in the past. Denies nausea, vomiting, bright red blood in stool, pus, fever, chills.  Reports mass in RUQ chronic, from gastric banding. PCP: [name], MD   Patient is a 48 y.o. male presenting with abdominal pain. The history is provided by the patient. No language interpreter was used.  Abdominal Pain Associated symptoms: diarrhea   Associated symptoms: no chills, no constipation, no dysuria, no fever, no hematuria, no nausea and no vomiting     Past Medical History  Diagnosis Date  . Asthma   . Pneumonia     history of  . 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 Meralgia paraesthetica March 2015    Bilaterally   Past Surgical History  Procedure Laterality Date  . Laparoscopic gastric banding  04-26-2007  . Repair recurrent right inguinal hernia  10-22-2008  . Surg. for undescended testicles/ bilateral inguinal hernia repair  AGE 3 MON OLD  . Left shoulder surg  2000    rotator cuff  . Hernia repair  08/2008    rt inguinal  . Shoulder arthroscopy  01/2009    rt   . Shoulder arthroscopy  07/01/2011    Procedure: ARTHROSCOPY SHOULDER;  Surgeon: Magnus Sinning, MD;  Location: [facility];  Service: Orthopedics;  Laterality: Left;  WITH LABRAL DEBRIDEMENT  . Shoulder open rotator cuff repair  07/01/2011    Procedure: ROTATOR CUFF REPAIR SHOULDER OPEN;  Surgeon: Magnus Sinning, MD;  Location: [place];  Service: Orthopedics;  Laterality: Left;  OPEN DISTAL CLAVICLE REPAIR  . Vasectomy    . Leg surgery      cancer   Family History  Problem Relation Age of Onset  . Heart disease Father   . Cancer Mother     kidney, spleen   History  Substance Use Topics  . Smoking status: Former Smoker -- 0.25 packs/day for 1 years    Quit date: 06/25/1988  . Smokeless tobacco: Former Systems developer     Comment: smoked socially  . Alcohol Use: No    Review of Systems  Constitutional: Negative for fever and chills.  Gastrointestinal: Positive for abdominal pain and diarrhea. Negative for nausea, vomiting, constipation, blood in stool and anal bleeding.  Genitourinary: Negative for dysuria and hematuria.      Allergies  Penicillins; Solu-medrol; Oxycodone; and Percocet  Home Medications   Prior to Admission medications   Medication Sig Start Date End Date Taking? Authorizing Provider  albuterol (PROVENTIL HFA;VENTOLIN HFA) 108 (90 BASE) MCG/ACT inhaler Inhale 2 puffs into the lungs every 6 (six) hours as needed for wheezing or shortness of breath.    Yes Historical Provider, MD  bisacodyl (BISACODYL) 5 MG EC tablet Take 5 mg by mouth daily as needed for moderate constipation.  Yes Historical Provider, MD  Ergocalciferol (VITAMIN D2) 2000 UNITS TABS Take by mouth.   Yes Historical Provider, MD  Fluticasone-Salmeterol (ADVAIR) 250-50 MCG/DOSE AEPB Inhale 1 puff into the lungs every 12 (twelve) hours.   Yes Historical Provider, MD  gabapentin (NEURONTIN) 300 MG capsule Take 1 capsule (300 mg total) by mouth at bedtime. 10/30/13  Yes Donika K Patel, DO  ibuprofen (ADVIL,MOTRIN) 200 MG tablet Take 200 mg by mouth every 6  (six) hours as needed for moderate pain.   Yes Historical Provider, MD  lidocaine (XYLOCAINE) 5 % ointment Apply to lateral thigh twice daily as needed.  Wear gloves when applying medication. 11/02/13  Yes Donika K Patel, DO  polyethylene glycol-electrolytes (NULYTELY/GOLYTELY) 420 G solution Take 4,000 mLs by mouth once.   Yes Historical Provider, MD   BP 160/87  Pulse 84  Temp(Src) 98.8 F (37.1 C) (Oral)  Resp 20  Ht 6\' 2"  (1.88 m)  Wt 333 lb (151.048 kg)  BMI 42.74 kg/m2  SpO2 98% Physical Exam  Nursing note and vitals reviewed. Constitutional: He is oriented to person, place, and time. He appears well-developed and well-nourished. No distress.  HENT:  Head: Normocephalic and atraumatic.  Eyes: EOM are normal. Pupils are equal, round, and reactive to light. No scleral icterus.  Neck: Normal range of motion. Neck supple.  Cardiovascular: Normal rate.   Pulmonary/Chest: Effort normal and breath sounds normal. No respiratory distress. He has no wheezes.  Abdominal: Soft. There is generalized tenderness. There is no rigidity, no rebound, no guarding, no tenderness at McBurney's point and negative Murphy's sign.  Mass felt in RUQ, multiple well healing scars.  Musculoskeletal: Normal range of motion.  Neurological: He is alert and oriented to person, place, and time.  Skin: Skin is warm and dry. He is not diaphoretic.  Psychiatric: He has a normal mood and affect. His behavior is normal.    ED Course  Procedures (including critical care time) Labs Review Results for orders placed during the hospital encounter of 11/07/13  CBC WITH DIFFERENTIAL      Result Value Ref Range   WBC 10.7 (*) 4.0 - 10.5 K/uL   RBC 4.99  4.22 - 5.81 MIL/uL   Hemoglobin 13.6  13.0 - 17.0 g/dL   HCT 41.3  39.0 - 52.0 %   MCV 82.8  78.0 - 100.0 fL   MCH 27.3  26.0 - 34.0 pg   MCHC 32.9  30.0 - 36.0 g/dL   RDW 15.2  11.5 - 15.5 %   Platelets 302  150 - 400 K/uL   Neutrophils Relative % 71  43 - 77 %    Neutro Abs 7.7  1.7 - 7.7 K/uL   Lymphocytes Relative 22  12 - 46 %   Lymphs Abs 2.4  0.7 - 4.0 K/uL   Monocytes Relative 4  3 - 12 %   Monocytes Absolute 0.4  0.1 - 1.0 K/uL   Eosinophils Relative 2  0 - 5 %   Eosinophils Absolute 0.2  0.0 - 0.7 K/uL   Basophils Relative 1  0 - 1 %   Basophils Absolute 0.1  0.0 - 0.1 K/uL  BASIC METABOLIC PANEL      Result Value Ref Range   Sodium 138  137 - 147 mEq/L   Potassium 3.7  3.7 - 5.3 mEq/L   Chloride 102  96 - 112 mEq/L   CO2 22  19 - 32 mEq/L   Glucose, Bld 120 (*) 70 -  99 mg/dL   BUN 7  6 - 23 mg/dL   Creatinine, Ser 0.94  0.50 - 1.35 mg/dL   Calcium 9.2  8.4 - 10.5 mg/dL   GFR calc non Af Amer >90  >90 mL/min   GFR calc Af Amer >90  >90 mL/min   Anion gap 14  5 - 15   No results found.   Imaging Review No results found.   EKG Interpretation None      MDM   Final diagnoses:  Generalized abdominal pain   Patient presents with abdominal discomfort after initiating his colonoscopy medication. He reports 4 episodes of loose stool denies fever, vomiting. Likely abdominal discomfort associated with bowel prep, no peritoneal signs 5:28 AM reevaluation patient resting comfortably in room. Reports moderate symptom improvement with medication, discomfort 5/10. And was able to sleep. Reevaluation patient resting comfortably in room. Requesting discharge at this time. Discussed treatment plan with the patient. Return precautions given. Reports understanding and no other concerns at this time.  Patient is stable for discharge at this time.   Meds given in ED:  Medications  dicyclomine (BENTYL) injection 20 mg (20 mg Intramuscular Given 11/07/13 0442)  morphine 4 MG/ML injection 4 mg (4 mg Intravenous Given 11/07/13 0438)  morphine 4 MG/ML injection 4 mg (4 mg Intravenous Given 11/07/13 0541)    New Prescriptions   DICYCLOMINE (BENTYL) 20 MG TABLET    Take 1 tablet (20 mg total) by mouth 2 (two) times daily.        Lorrine Kin, PA-C 11/07/13 [phone]

## 2013-11-08 NOTE — ED Provider Notes (Signed)
Medical screening examination/treatment/procedure(s) were performed by non-physician practitioner and as supervising physician I was immediately available for consultation/collaboration.   EKG Interpretation None        Julianne Rice, MD 11/08/13 [phone]

## 2013-12-12 ENCOUNTER — Other Ambulatory Visit (HOSPITAL_COMMUNITY): Payer: Self-pay | Admitting: Family Medicine

## 2013-12-12 ENCOUNTER — Ambulatory Visit (HOSPITAL_COMMUNITY)
Admission: RE | Admit: 2013-12-12 | Discharge: 2013-12-12 | Disposition: A | Discharge status: Home / Self Care | Payer: BC Managed Care – PPO | Source: Ambulatory Visit | Attending: Family Medicine | Admitting: Family Medicine

## 2013-12-12 DIAGNOSIS — R1084 Generalized abdominal pain: Secondary | ICD-10-CM

## 2013-12-12 DIAGNOSIS — K5732 Diverticulitis of large intestine without perforation or abscess without bleeding: Secondary | ICD-10-CM | POA: Diagnosis not present

## 2013-12-12 MED ORDER — IOHEXOL 300 MG/ML  SOLN
100.0000 mL | Freq: Once | INTRAMUSCULAR | Status: AC | PRN
  Administered 2013-12-12: 100 mL via INTRAVENOUS

## 2013-12-12 MED ORDER — IOHEXOL 300 MG/ML  SOLN
50.0000 mL | Freq: Once | INTRAMUSCULAR | Status: AC | PRN
  Administered 2013-12-12: 50 mL via ORAL

## 2013-12-13 ENCOUNTER — Emergency Department (HOSPITAL_COMMUNITY)
Admission: EM | Admit: 2013-12-13 | Discharge: 2013-12-13 | Disposition: A | Discharge status: Home / Self Care | Payer: BC Managed Care – PPO | Attending: Emergency Medicine | Admitting: Emergency Medicine

## 2013-12-13 ENCOUNTER — Encounter (HOSPITAL_COMMUNITY): Payer: Self-pay | Admitting: Emergency Medicine

## 2013-12-13 DIAGNOSIS — Z79899 Other long term (current) drug therapy: Secondary | ICD-10-CM | POA: Diagnosis not present

## 2013-12-13 DIAGNOSIS — Z8701 Personal history of pneumonia (recurrent): Secondary | ICD-10-CM | POA: Insufficient documentation

## 2013-12-13 DIAGNOSIS — Z880 Allergy status to penicillin: Secondary | ICD-10-CM | POA: Diagnosis not present

## 2013-12-13 DIAGNOSIS — IMO0002 Reserved for concepts with insufficient information to code with codable children: Secondary | ICD-10-CM | POA: Insufficient documentation

## 2013-12-13 DIAGNOSIS — K5792 Diverticulitis of intestine, part unspecified, without perforation or abscess without bleeding: Secondary | ICD-10-CM

## 2013-12-13 DIAGNOSIS — J45909 Unspecified asthma, uncomplicated: Secondary | ICD-10-CM | POA: Insufficient documentation

## 2013-12-13 DIAGNOSIS — R109 Unspecified abdominal pain: Secondary | ICD-10-CM | POA: Insufficient documentation

## 2013-12-13 DIAGNOSIS — K5732 Diverticulitis of large intestine without perforation or abscess without bleeding: Secondary | ICD-10-CM | POA: Diagnosis not present

## 2013-12-13 DIAGNOSIS — Z87891 Personal history of nicotine dependence: Secondary | ICD-10-CM | POA: Diagnosis not present

## 2013-12-13 LAB — CBC WITH DIFFERENTIAL/PLATELET
BASOS PCT: 1 % (ref 0–1)
Basophils Absolute: 0 10*3/uL (ref 0.0–0.1)
EOS ABS: 0.3 10*3/uL (ref 0.0–0.7)
EOS PCT: 3 % (ref 0–5)
HCT: 42.6 % (ref 39.0–52.0)
Hemoglobin: 13.8 g/dL (ref 13.0–17.0)
LYMPHS ABS: 3.7 10*3/uL (ref 0.7–4.0)
Lymphocytes Relative: 42 % (ref 12–46)
MCH: 26.9 pg (ref 26.0–34.0)
MCHC: 32.4 g/dL (ref 30.0–36.0)
MCV: 83 fL (ref 78.0–100.0)
Monocytes Absolute: 0.5 10*3/uL (ref 0.1–1.0)
Monocytes Relative: 6 % (ref 3–12)
NEUTROS PCT: 48 % (ref 43–77)
Neutro Abs: 4.3 10*3/uL (ref 1.7–7.7)
Platelets: 331 10*3/uL (ref 150–400)
RBC: 5.13 MIL/uL (ref 4.22–5.81)
RDW: 15.1 % (ref 11.5–15.5)
WBC: 8.9 10*3/uL (ref 4.0–10.5)

## 2013-12-13 LAB — COMPREHENSIVE METABOLIC PANEL
ALBUMIN: 3.8 g/dL (ref 3.5–5.2)
ALT: 18 U/L (ref 0–53)
ANION GAP: 12 (ref 5–15)
AST: 18 U/L (ref 0–37)
Alkaline Phosphatase: 79 U/L (ref 39–117)
BUN: 9 mg/dL (ref 6–23)
CALCIUM: 9.3 mg/dL (ref 8.4–10.5)
CO2: 23 mEq/L (ref 19–32)
Chloride: 104 mEq/L (ref 96–112)
Creatinine, Ser: 1.01 mg/dL (ref 0.50–1.35)
GFR calc non Af Amer: 86 mL/min — ABNORMAL LOW (ref >90)
Glucose, Bld: 107 mg/dL — ABNORMAL HIGH (ref 70–99)
Potassium: 4.2 mEq/L (ref 3.7–5.3)
Sodium: 139 mEq/L (ref 137–147)
TOTAL PROTEIN: 7.6 g/dL (ref 6.0–8.3)
Total Bilirubin: 0.3 mg/dL (ref 0.3–1.2)

## 2013-12-13 LAB — URINALYSIS, ROUTINE W REFLEX MICROSCOPIC
Bilirubin Urine: NEGATIVE
GLUCOSE, UA: NEGATIVE mg/dL
KETONES UR: NEGATIVE mg/dL
Nitrite: NEGATIVE
PROTEIN: NEGATIVE mg/dL
Specific Gravity, Urine: 1.025 (ref 1.005–1.030)
UROBILINOGEN UA: 0.2 mg/dL (ref 0.0–1.0)
pH: 5 (ref 5.0–8.0)

## 2013-12-13 LAB — URINE MICROSCOPIC-ADD ON

## 2013-12-13 LAB — LIPASE, BLOOD: Lipase: 80 U/L — ABNORMAL HIGH (ref 11–59)

## 2013-12-13 MED ORDER — MORPHINE SULFATE 4 MG/ML IJ SOLN
4.0000 mg | Freq: Once | INTRAMUSCULAR | Status: AC
  Administered 2013-12-13: 4 mg via INTRAVENOUS
  Filled 2013-12-13: qty 1

## 2013-12-13 MED ORDER — MORPHINE SULFATE 15 MG PO TABS: 15.0000 mg | ORAL_TABLET | Freq: Four times a day (QID) | ORAL | Status: DC | PRN

## 2013-12-13 MED ORDER — MORPHINE SULFATE 4 MG/ML IJ SOLN
8.0000 mg | Freq: Once | INTRAMUSCULAR | Status: AC
  Administered 2013-12-13: 8 mg via INTRAVENOUS
  Filled 2013-12-13: qty 2

## 2013-12-13 MED ORDER — SODIUM CHLORIDE 0.9 % IV BOLUS (SEPSIS)
1000.0000 mL | Freq: Once | INTRAVENOUS | Status: AC
  Administered 2013-12-13: 1000 mL via INTRAVENOUS

## 2013-12-13 MED ORDER — ONDANSETRON HCL 4 MG PO TABS: 4.0000 mg | ORAL_TABLET | Freq: Four times a day (QID) | ORAL | Status: DC

## 2013-12-13 MED ORDER — ONDANSETRON HCL 4 MG/2ML IJ SOLN
4.0000 mg | Freq: Once | INTRAMUSCULAR | Status: AC
  Administered 2013-12-13: 4 mg via INTRAVENOUS
  Filled 2013-12-13: qty 2

## 2013-12-13 MED ORDER — MORPHINE SULFATE 15 MG PO TABS
15.0000 mg | ORAL_TABLET | Freq: Once | ORAL | Status: AC
  Administered 2013-12-13: 15 mg via ORAL
  Filled 2013-12-13: qty 1

## 2013-12-13 NOTE — ED Notes (Signed)
Initial Contact - pt resting on stretcher with family at bedside, pt reports had CT yesterday for abd pain and told to come to ER today with dx diverticulitis.  Pt also reports n/v.  No active vomiting.  Speaking full/clear sentences, rr even/un-lab.  Skin PWD.  MAEI, self repositioning for comfort.   NAD.

## 2013-12-13 NOTE — ED Provider Notes (Signed)
CSN: [id number]     Arrival date & time 12/13/13  1404 History   First MD Initiated Contact with Patient 12/13/13 1503     Chief Complaint  Patient presents with  . Abdominal Pain  . diverticulitis      (Consider location/radiation/quality/duration/timing/severity/associated sxs/prior Treatment) Patient is a 48 y.o. male presenting with abdominal pain.  Abdominal Pain Pain location:  Periumbilical and epigastric Pain quality: aching   Pain radiates to:  Does not radiate Pain severity:  Severe Onset quality:  Gradual Duration:  4 days Timing:  Constant Progression:  Unchanged Context comment:  History of diverticulitis.  PCP obtained CT yesterday that showed diverticulitis.  Has taken two doses of Cipro/Flagyl, but pain has not been well controlled Relieved by: morphine tablets. Worsened by:  Eating Associated symptoms: no diarrhea, no fever, no hematuria, no nausea and no vomiting     Past Medical History  Diagnosis Date  . Asthma   . Pneumonia     history of  . 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 Meralgia paraesthetica March 2015    Bilaterally   Past Surgical History  Procedure Laterality Date  . Laparoscopic gastric banding  04-26-2007  . Repair recurrent right inguinal hernia  10-22-2008  . Surg. for undescended testicles/ bilateral inguinal hernia repair  AGE 50 MON OLD  . Left shoulder surg  2000    rotator cuff  . Hernia repair  08/2008    rt inguinal  . Shoulder arthroscopy  01/2009    rt   . Shoulder arthroscopy  07/01/2011    Procedure: ARTHROSCOPY SHOULDER;  Surgeon: Magnus Sinning, MD;  Location: [facility];  Service: Orthopedics;  Laterality: Left;  WITH LABRAL DEBRIDEMENT  . Shoulder open rotator cuff repair  07/01/2011    Procedure: ROTATOR CUFF REPAIR SHOULDER OPEN;  Surgeon: Magnus Sinning, MD;  Location: [place];  Service: Orthopedics;  Laterality: Left;  OPEN DISTAL CLAVICLE REPAIR  . Vasectomy    . Leg surgery       cancer   Family History  Problem Relation Age of Onset  . Heart disease Father   . Cancer Mother     kidney, spleen   History  Substance Use Topics  . Smoking status: Former Smoker -- 0.25 packs/day for 1 years    Quit date: 06/25/1988  . Smokeless tobacco: Former Systems developer     Comment: smoked socially  . Alcohol Use: No    Review of Systems  Constitutional: Negative for fever.  Gastrointestinal: Positive for abdominal pain. Negative for nausea, vomiting and diarrhea.  Genitourinary: Negative for hematuria.  All other systems reviewed and are negative.     Allergies  Penicillins; Solu-medrol; Dilaudid; Oxycodone; and Percocet  Home Medications   Prior to Admission medications   Medication Sig Start Date End Date Taking? Authorizing Provider  albuterol (PROVENTIL HFA;VENTOLIN HFA) 108 (90 BASE) MCG/ACT inhaler Inhale 2 puffs into the lungs every 6 (six) hours as needed for wheezing or shortness of breath.    Yes Historical Provider, MD  Fluticasone-Salmeterol (ADVAIR) 250-50 MCG/DOSE AEPB Inhale 1 puff into the lungs every 12 (twelve) hours.   Yes Historical Provider, MD  gabapentin (NEURONTIN) 300 MG capsule Take 1 capsule (300 mg total) by mouth at bedtime. 10/30/13  Yes Donika K Patel, DO  ibuprofen (ADVIL,MOTRIN) 200 MG tablet Take 200 mg by mouth every 6 (six) hours as needed for moderate pain.   Yes Historical Provider, MD  lidocaine (XYLOCAINE) 5 % ointment  Apply to lateral thigh twice daily as needed.  Wear gloves when applying medication. 11/02/13  Yes Donika K Patel, DO  metroNIDAZOLE (FLAGYL) 500 MG tablet Take 500 mg by mouth 3 (three) times daily.   Yes Historical Provider, MD  morphine (MSIR) 15 MG tablet Take 15 mg by mouth every 6 (six) hours as needed for moderate pain or severe pain.   Yes Historical Provider, MD  polyethylene glycol-electrolytes (NULYTELY/GOLYTELY) 420 G solution Take 4,000 mLs by mouth once.   Yes Historical Provider, MD   BP 121/82  Pulse 70   Temp(Src) 98.4 F (36.9 C) (Oral)  Resp 16  SpO2 97% Physical Exam  Nursing note and vitals reviewed. Constitutional: He is oriented to person, place, and time. He appears well-developed and well-nourished. No distress.  HENT:  Head: Normocephalic and atraumatic.  Mouth/Throat: Oropharynx is clear and moist.  Eyes: Conjunctivae are normal. Pupils are equal, round, and reactive to light. No scleral icterus.  Neck: Neck supple.  Cardiovascular: Normal rate, regular rhythm, normal heart sounds and intact distal pulses.   No murmur heard. Pulmonary/Chest: Effort normal and breath sounds normal. No stridor. No respiratory distress. He has no wheezes. He has no rales.  Abdominal: Soft. He exhibits no distension. There is tenderness (no epigastric or RUQ TTP) in the right lower quadrant, suprapubic area and left lower quadrant. There is no rigidity, no rebound and no guarding.  Musculoskeletal: Normal range of motion. He exhibits no edema.  Neurological: He is alert and oriented to person, place, and time.  Skin: Skin is warm and dry. No rash noted.  Psychiatric: He has a normal mood and affect. His behavior is normal.    ED Course  Procedures (including critical care time) Labs Review Labs Reviewed  COMPREHENSIVE METABOLIC PANEL - Abnormal; Notable for the following:    Glucose, Bld 107 (*)    GFR calc non Af Amer 86 (*)    All other components within normal limits  LIPASE, BLOOD - Abnormal; Notable for the following:    Lipase 80 (*)    All other components within normal limits  URINALYSIS, ROUTINE W REFLEX MICROSCOPIC - Abnormal; Notable for the following:    Color, Urine AMBER (*)    APPearance CLOUDY (*)    Hgb urine dipstick SMALL (*)    Leukocytes, UA TRACE (*)    All other components within normal limits  URINE MICROSCOPIC-ADD ON - Abnormal; Notable for the following:    Crystals URIC ACID CRYSTALS (*)    All other components within normal limits  CBC WITH DIFFERENTIAL     Imaging Review Ct Abdomen Pelvis W Contrast  12/12/2013   CLINICAL DATA:  Mid abdominal to epigastric pain  EXAM: CT ABDOMEN AND PELVIS WITH CONTRAST  TECHNIQUE: Multidetector CT imaging of the abdomen and pelvis was performed using the standard protocol following bolus administration of intravenous contrast.  CONTRAST:  13mL OMNIPAQUE IOHEXOL 300 MG/ML SOLN, 122mL OMNIPAQUE IOHEXOL 300 MG/ML SOLN  COMPARISON:  CT 09/15/2013  FINDINGS: Lung bases are clear.  No pericardial fluid.  No focal hepatic lesion. Gallbladder, pancreas, spleen, adrenal glands, and kidneys are normal.  There is a gastric banding device in place. No complicating features are identified. The small bowel, appendix, and cecum normal. In the proximal sigmoid colon there is a small focus of pericolonic inflammation (image 67, series 2. This is at the stage site of previously seen mild diverticulitis. Findings are slightly less severe than comparison exam. There is a large  diverticulum at this level the seen better on comparison exam measuring approximately 2 cm. The distal rectosigmoid colon is normal.  Abdominal aorta is normal caliber. No retroperitoneal periportal lymphadenopathy.  No free fluid the pelvis. Prostate gland and bladder normal. There small bilateral fat filled inguinal hernias. No aggressive osseous lesion.  IMPRESSION: 1. Mild acute diverticulitis of the proximal sigmoid colon. There is a large diverticula this level which was previously inflamed. Findings are are less severe than and mild to moderate diverticulitis identified on CT of 09/13/2013 2. Normal appendix. 3. Gastric may device placed by complicating features.   Electronically Signed   By: Suzy Bouchard M.D.   On: 12/12/2013 18:26     EKG Interpretation None      MDM   Final diagnoses:  Diverticulitis of intestine without perforation or abscess without bleeding    48 yo male with CT evidence of diverticulitis presenting with uncontrolled  abdominal pain.  Abd soft, tender in lower abdomen, no R/R/G.  Pt is afebrile and nontoxic.  Will try to control pain with morphine.    Pain controlled with IV and then PO morphine.  Nausea resolved with Zofran.  Repeat abd exam remains benign.  Labs showed mildly elevated lipase without clinical appearance of pancreatitis.  Had normal pancreas yesterday.  I don't think he needs additional imaging or admission.  Advised he continue his treatment with Cipro and Flagyl and call his PCP tomorrow for a recheck.  Return precautions given.    Houston Siren III, MD 12/13/13 [phone]

## 2013-12-13 NOTE — ED Notes (Signed)
Pt continues to c/o pain, EDP made aware.

## 2013-12-13 NOTE — ED Notes (Signed)
Pt reports he had a CT scan yesterday results were diverticulitis. PCP called pt and told him to come to ED. Abdominal pain 10/10. Denies n/v/d or dysuria.

## 2013-12-13 NOTE — Discharge Instructions (Signed)

## 2013-12-15 ENCOUNTER — Emergency Department (HOSPITAL_COMMUNITY): Payer: BC Managed Care – PPO

## 2013-12-15 ENCOUNTER — Encounter (HOSPITAL_COMMUNITY): Payer: Self-pay | Admitting: Emergency Medicine

## 2013-12-15 ENCOUNTER — Inpatient Hospital Stay (HOSPITAL_COMMUNITY)
Admission: EM | Admit: 2013-12-15 | Discharge: 2013-12-21 | DRG: 392 | Disposition: A | Discharge status: Home / Self Care | Payer: BC Managed Care – PPO | Attending: Internal Medicine | Admitting: Internal Medicine

## 2013-12-15 DIAGNOSIS — D481 Neoplasm of uncertain behavior of connective and other soft tissue: Secondary | ICD-10-CM | POA: Diagnosis present

## 2013-12-15 DIAGNOSIS — K5792 Diverticulitis of intestine, part unspecified, without perforation or abscess without bleeding: Secondary | ICD-10-CM | POA: Diagnosis present

## 2013-12-15 DIAGNOSIS — J45909 Unspecified asthma, uncomplicated: Secondary | ICD-10-CM | POA: Diagnosis present

## 2013-12-15 DIAGNOSIS — R109 Unspecified abdominal pain: Secondary | ICD-10-CM | POA: Diagnosis present

## 2013-12-15 DIAGNOSIS — K7689 Other specified diseases of liver: Secondary | ICD-10-CM | POA: Diagnosis present

## 2013-12-15 DIAGNOSIS — I10 Essential (primary) hypertension: Secondary | ICD-10-CM | POA: Diagnosis present

## 2013-12-15 DIAGNOSIS — Z8249 Family history of ischemic heart disease and other diseases of the circulatory system: Secondary | ICD-10-CM | POA: Diagnosis not present

## 2013-12-15 DIAGNOSIS — R748 Abnormal levels of other serum enzymes: Secondary | ICD-10-CM

## 2013-12-15 DIAGNOSIS — K5732 Diverticulitis of large intestine without perforation or abscess without bleeding: Principal | ICD-10-CM

## 2013-12-15 DIAGNOSIS — J4530 Mild persistent asthma, uncomplicated: Secondary | ICD-10-CM

## 2013-12-15 DIAGNOSIS — Z6841 Body Mass Index (BMI) 40.0 and over, adult: Secondary | ICD-10-CM

## 2013-12-15 DIAGNOSIS — E669 Obesity, unspecified: Secondary | ICD-10-CM | POA: Diagnosis present

## 2013-12-15 DIAGNOSIS — K760 Fatty (change of) liver, not elsewhere classified: Secondary | ICD-10-CM

## 2013-12-15 DIAGNOSIS — Z9884 Bariatric surgery status: Secondary | ICD-10-CM

## 2013-12-15 DIAGNOSIS — Z87891 Personal history of nicotine dependence: Secondary | ICD-10-CM | POA: Diagnosis not present

## 2013-12-15 DIAGNOSIS — D4819 Other specified neoplasm of uncertain behavior of connective and other soft tissue: Secondary | ICD-10-CM | POA: Diagnosis present

## 2013-12-15 DIAGNOSIS — C4920 Malignant neoplasm of connective and soft tissue of unspecified lower limb, including hip: Secondary | ICD-10-CM

## 2013-12-15 HISTORY — DX: Diverticulitis of intestine, part unspecified, without perforation or abscess without bleeding: K57.92

## 2013-12-15 LAB — COMPREHENSIVE METABOLIC PANEL
ALT: 20 U/L (ref 0–53)
AST: 20 U/L (ref 0–37)
Albumin: 3.5 g/dL (ref 3.5–5.2)
Alkaline Phosphatase: 72 U/L (ref 39–117)
Anion gap: 13 (ref 5–15)
BUN: 8 mg/dL (ref 6–23)
CO2: 23 meq/L (ref 19–32)
CREATININE: 1.07 mg/dL (ref 0.50–1.35)
Calcium: 9 mg/dL (ref 8.4–10.5)
Chloride: 102 mEq/L (ref 96–112)
GFR calc Af Amer: >90 mL/min (ref >90)
GFR, EST NON AFRICAN AMERICAN: 80 mL/min — AB (ref >90)
Glucose, Bld: 125 mg/dL — ABNORMAL HIGH (ref 70–99)
Potassium: 3.7 mEq/L (ref 3.7–5.3)
SODIUM: 138 meq/L (ref 137–147)
Total Bilirubin: 0.3 mg/dL (ref 0.3–1.2)
Total Protein: 7.4 g/dL (ref 6.0–8.3)

## 2013-12-15 LAB — URINALYSIS, ROUTINE W REFLEX MICROSCOPIC
Bilirubin Urine: NEGATIVE
GLUCOSE, UA: NEGATIVE mg/dL
KETONES UR: NEGATIVE mg/dL
Nitrite: NEGATIVE
PROTEIN: NEGATIVE mg/dL
Specific Gravity, Urine: 1.027 (ref 1.005–1.030)
UROBILINOGEN UA: 1 mg/dL (ref 0.0–1.0)
pH: 6 (ref 5.0–8.0)

## 2013-12-15 LAB — CBC WITH DIFFERENTIAL/PLATELET
Basophils Absolute: 0 10*3/uL (ref 0.0–0.1)
Basophils Relative: 0 % (ref 0–1)
EOS PCT: 2 % (ref 0–5)
Eosinophils Absolute: 0.2 10*3/uL (ref 0.0–0.7)
HEMATOCRIT: 41.4 % (ref 39.0–52.0)
Hemoglobin: 13.5 g/dL (ref 13.0–17.0)
LYMPHS ABS: 2.8 10*3/uL (ref 0.7–4.0)
LYMPHS PCT: 34 % (ref 12–46)
MCH: 27.4 pg (ref 26.0–34.0)
MCHC: 32.6 g/dL (ref 30.0–36.0)
MCV: 84.1 fL (ref 78.0–100.0)
MONOS PCT: 5 % (ref 3–12)
Monocytes Absolute: 0.4 10*3/uL (ref 0.1–1.0)
Neutro Abs: 4.8 10*3/uL (ref 1.7–7.7)
Neutrophils Relative %: 59 % (ref 43–77)
Platelets: 327 10*3/uL (ref 150–400)
RBC: 4.92 MIL/uL (ref 4.22–5.81)
RDW: 15.2 % (ref 11.5–15.5)
WBC: 8.2 10*3/uL (ref 4.0–10.5)

## 2013-12-15 LAB — URINE MICROSCOPIC-ADD ON

## 2013-12-15 LAB — I-STAT CG4 LACTIC ACID, ED: Lactic Acid, Venous: 0.81 mmol/L (ref 0.5–2.2)

## 2013-12-15 LAB — LIPASE, BLOOD: Lipase: 61 U/L — ABNORMAL HIGH (ref 11–59)

## 2013-12-15 MED ORDER — ONDANSETRON HCL 4 MG PO TABS
4.0000 mg | ORAL_TABLET | Freq: Four times a day (QID) | ORAL | Status: DC | PRN
Start: 2013-12-15 — End: 2013-12-21
  Administered 2013-12-16: 4 mg via ORAL

## 2013-12-15 MED ORDER — ONDANSETRON HCL 4 MG/2ML IJ SOLN: 4.0000 mg | Freq: Three times a day (TID) | INTRAMUSCULAR | Status: DC | PRN

## 2013-12-15 MED ORDER — CIPROFLOXACIN IN D5W 400 MG/200ML IV SOLN
400.0000 mg | Freq: Once | INTRAVENOUS | Status: AC
  Administered 2013-12-15: 400 mg via INTRAVENOUS
  Filled 2013-12-15: qty 200

## 2013-12-15 MED ORDER — ALBUTEROL SULFATE (2.5 MG/3ML) 0.083% IN NEBU: 2.5000 mg | INHALATION_SOLUTION | Freq: Four times a day (QID) | RESPIRATORY_TRACT | Status: DC | PRN

## 2013-12-15 MED ORDER — CIPROFLOXACIN IN D5W 400 MG/200ML IV SOLN
400.0000 mg | Freq: Two times a day (BID) | INTRAVENOUS | Status: DC
  Administered 2013-12-16 – 2013-12-20 (×9): 400 mg via INTRAVENOUS
  Filled 2013-12-15 (×10): qty 200

## 2013-12-15 MED ORDER — ALUM & MAG HYDROXIDE-SIMETH 200-200-20 MG/5ML PO SUSP: 30.0000 mL | Freq: Four times a day (QID) | ORAL | Status: DC | PRN

## 2013-12-15 MED ORDER — MORPHINE SULFATE 4 MG/ML IJ SOLN
4.0000 mg | Freq: Once | INTRAMUSCULAR | Status: AC
  Administered 2013-12-15: 4 mg via INTRAVENOUS
  Filled 2013-12-15: qty 1

## 2013-12-15 MED ORDER — ONDANSETRON HCL 4 MG/2ML IJ SOLN
4.0000 mg | Freq: Once | INTRAMUSCULAR | Status: AC
  Administered 2013-12-15: 4 mg via INTRAVENOUS
  Filled 2013-12-15: qty 2

## 2013-12-15 MED ORDER — METRONIDAZOLE IN NACL 5-0.79 MG/ML-% IV SOLN
500.0000 mg | Freq: Three times a day (TID) | INTRAVENOUS | Status: DC
  Administered 2013-12-16 – 2013-12-20 (×14): 500 mg via INTRAVENOUS
  Filled 2013-12-15 (×15): qty 100

## 2013-12-15 MED ORDER — ACETAMINOPHEN 650 MG RE SUPP: 650.0000 mg | Freq: Four times a day (QID) | RECTAL | Status: DC | PRN

## 2013-12-15 MED ORDER — LIDOCAINE 5 % EX OINT
1.0000 "application " | TOPICAL_OINTMENT | Freq: Three times a day (TID) | CUTANEOUS | Status: DC | PRN
  Filled 2013-12-15: qty 30

## 2013-12-15 MED ORDER — ONDANSETRON HCL 4 MG/2ML IJ SOLN
4.0000 mg | Freq: Four times a day (QID) | INTRAMUSCULAR | Status: DC | PRN
  Administered 2013-12-16 – 2013-12-18 (×5): 4 mg via INTRAVENOUS
  Filled 2013-12-15 (×8): qty 2

## 2013-12-15 MED ORDER — IOHEXOL 300 MG/ML  SOLN
50.0000 mL | Freq: Once | INTRAMUSCULAR | Status: AC | PRN
  Administered 2013-12-15: 50 mL via ORAL

## 2013-12-15 MED ORDER — POLYETHYLENE GLYCOL 3350 17 G PO PACK
17.0000 g | PACK | Freq: Every day | ORAL | Status: DC | PRN
Start: 2013-12-15 — End: 2013-12-20
  Filled 2013-12-15: qty 1

## 2013-12-15 MED ORDER — MORPHINE SULFATE 4 MG/ML IJ SOLN
4.0000 mg | INTRAMUSCULAR | Status: DC | PRN
  Administered 2013-12-15 – 2013-12-20 (×18): 4 mg via INTRAVENOUS
  Filled 2013-12-15 (×18): qty 1

## 2013-12-15 MED ORDER — IOHEXOL 300 MG/ML  SOLN
100.0000 mL | Freq: Once | INTRAMUSCULAR | Status: AC | PRN
  Administered 2013-12-15: 100 mL via INTRAVENOUS

## 2013-12-15 MED ORDER — MORPHINE SULFATE 4 MG/ML IJ SOLN: 4.0000 mg | INTRAMUSCULAR | Status: DC | PRN

## 2013-12-15 MED ORDER — METRONIDAZOLE IN NACL 5-0.79 MG/ML-% IV SOLN
500.0000 mg | Freq: Once | INTRAVENOUS | Status: AC
  Administered 2013-12-15: 500 mg via INTRAVENOUS
  Filled 2013-12-15: qty 100

## 2013-12-15 MED ORDER — ALBUTEROL SULFATE HFA 108 (90 BASE) MCG/ACT IN AERS: 2.0000 | INHALATION_SPRAY | Freq: Four times a day (QID) | RESPIRATORY_TRACT | Status: DC | PRN

## 2013-12-15 MED ORDER — MOMETASONE FURO-FORMOTEROL FUM 100-5 MCG/ACT IN AERO
2.0000 | INHALATION_SPRAY | Freq: Two times a day (BID) | RESPIRATORY_TRACT | Status: DC
  Administered 2013-12-15 – 2013-12-21 (×12): 2 via RESPIRATORY_TRACT
  Filled 2013-12-15: qty 8.8

## 2013-12-15 MED ORDER — SODIUM CHLORIDE 0.9 % IV SOLN
INTRAVENOUS | Status: DC
  Administered 2013-12-15 – 2013-12-20 (×3): via INTRAVENOUS

## 2013-12-15 MED ORDER — ACETAMINOPHEN 325 MG PO TABS
650.0000 mg | ORAL_TABLET | Freq: Four times a day (QID) | ORAL | Status: DC | PRN
Start: 2013-12-15 — End: 2013-12-21

## 2013-12-15 NOTE — ED Notes (Signed)
PA at bedside.

## 2013-12-15 NOTE — ED Provider Notes (Signed)
CSN: [id number]     Arrival date & time 12/15/13  1425 History   First MD Initiated Contact with Patient 12/15/13 1547     Chief Complaint  Patient presents with  . Abdominal Pain     (Consider location/radiation/quality/duration/timing/severity/associated sxs/prior Treatment) The history is provided by the patient and medical records.   This is a 47 y.o. M with PMH significant for diverticulitis, desmoid fibromatosis, meralgia paresthetica, presenting to the ED for abdominal pain.  Pain has been ongoing for the past 6 days, seen in the ED 3 days ago with CT scan revealing diverticulitis. He was started on Cipro and Flagyl as well as home morphine however states he feels his symptoms are worsening. He has been seen twice since original diagnosis due to ongoing/worsening symptoms, last visit with PCP was yesterday. Medication is causing him some nausea, denies vomiting.  Did have a small BM this morning, states much smaller than normal.  He is able to pass flatus, but states he has to force it.  Denies urinary sx, fever, chills, sweats.  Patient has been having ongoing issues with diverticulitis since May 2015.  No known abscess or perforation associated with this.  Prior abdominal surgeries include gastric banding, hernia repair, vasectomy.  Followed by GI, Dr. Michail Sermon.  Past Medical History  Diagnosis Date  . Asthma   . Pneumonia     history of  . 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 Meralgia paraesthetica March 2015    Bilaterally  . Diverticulitis    Past Surgical History  Procedure Laterality Date  . Laparoscopic gastric banding  04-26-2007  . Repair recurrent right inguinal hernia  10-22-2008  . Surg. for undescended testicles/ bilateral inguinal hernia repair  AGE 4 MON OLD  . Left shoulder surg  2000    rotator cuff  . Hernia repair  08/2008    rt inguinal  . Shoulder arthroscopy  01/2009    rt   . Shoulder arthroscopy  07/01/2011    Procedure: ARTHROSCOPY SHOULDER;  Surgeon:  Magnus Sinning, MD;  Location: [facility];  Service: Orthopedics;  Laterality: Left;  WITH LABRAL DEBRIDEMENT  . Shoulder open rotator cuff repair  07/01/2011    Procedure: ROTATOR CUFF REPAIR SHOULDER OPEN;  Surgeon: Magnus Sinning, MD;  Location: [place];  Service: Orthopedics;  Laterality: Left;  OPEN DISTAL CLAVICLE REPAIR  . Vasectomy    . Leg surgery      cancer   Family History  Problem Relation Age of Onset  . Heart disease Father   . Cancer Mother     kidney, spleen   History  Substance Use Topics  . Smoking status: Former Smoker -- 0.25 packs/day for 1 years    Quit date: 06/25/1988  . Smokeless tobacco: Former Systems developer     Comment: smoked socially  . Alcohol Use: No    Review of Systems  Gastrointestinal: Positive for nausea and abdominal pain.  All other systems reviewed and are negative.     Allergies  Penicillins; Solu-medrol; Dilaudid; Oxycodone; and Percocet  Home Medications   Prior to Admission medications   Medication Sig Start Date End Date Taking? Authorizing Provider  albuterol (PROVENTIL HFA;VENTOLIN HFA) 108 (90 BASE) MCG/ACT inhaler Inhale 2 puffs into the lungs every 6 (six) hours as needed for wheezing or shortness of breath.    Yes Historical Provider, MD  Fluticasone-Salmeterol (ADVAIR) 250-50 MCG/DOSE AEPB Inhale 1 puff into the lungs every 12 (twelve) hours.   Yes  Historical Provider, MD  lidocaine (XYLOCAINE) 5 % ointment Apply to lateral thigh twice daily as needed.  Wear gloves when applying medication. 11/02/13  Yes Donika K Patel, DO  metroNIDAZOLE (FLAGYL) 500 MG tablet Take 500 mg by mouth 3 (three) times daily.   Yes Historical Provider, MD  morphine (MSIR) 15 MG tablet Take 1-2 tablets (15-30 mg total) by mouth every 6 (six) hours as needed for moderate pain or severe pain. 12/13/13  Yes Houston Siren III, MD  ondansetron (ZOFRAN) 4 MG tablet Take 1 tablet (4 mg total) by mouth every 6 (six) hours.  12/13/13  Yes Houston Siren III, MD   BP 136/78  Pulse 72  Temp(Src) 98.3 F (36.8 C) (Oral)  Resp 18  SpO2 96%  Physical Exam  Nursing note and vitals reviewed. Constitutional: He is oriented to person, place, and time. He appears well-developed and well-nourished. No distress.  HENT:  Head: Normocephalic and atraumatic.  Mouth/Throat: Oropharynx is clear and moist.  Eyes: Conjunctivae and EOM are normal. Pupils are equal, round, and reactive to light.  Neck: Normal range of motion. Neck supple.  Cardiovascular: Normal rate, regular rhythm and normal heart sounds.   Pulmonary/Chest: Effort normal and breath sounds normal. No respiratory distress. He has no wheezes.  Abdominal: Soft. Bowel sounds are normal. There is tenderness. There is no guarding.  Abdomen soft, nondistended, tenderness in the periumbilical and lower abdominal regions; no rebound or guarding  Musculoskeletal: Normal range of motion. He exhibits no edema.  Neurological: He is alert and oriented to person, place, and time.  Skin: Skin is warm and dry. He is not diaphoretic.  Psychiatric: He has a normal mood and affect.    ED Course  Procedures (including critical care time) Labs Review Labs Reviewed  COMPREHENSIVE METABOLIC PANEL - Abnormal; Notable for the following:    Glucose, Bld 125 (*)    GFR calc non Af Amer 80 (*)    All other components within normal limits  LIPASE, BLOOD - Abnormal; Notable for the following:    Lipase 61 (*)    All other components within normal limits  URINALYSIS, ROUTINE W REFLEX MICROSCOPIC - Abnormal; Notable for the following:    Color, Urine AMBER (*)    Hgb urine dipstick TRACE (*)    Leukocytes, UA SMALL (*)    All other components within normal limits  CBC WITH DIFFERENTIAL  URINE MICROSCOPIC-ADD ON  I-STAT CG4 LACTIC ACID, ED    Imaging Review Ct Abdomen Pelvis W Contrast  12/15/2013   CLINICAL DATA:  Abdominal pain; history of diverticulitis  EXAM: CT  ABDOMEN AND PELVIS WITH CONTRAST  TECHNIQUE: Multidetector CT imaging of the abdomen and pelvis was performed using the standard protocol following bolus administration of intravenous contrast. Oral contrast was also administered.  CONTRAST:  154mL OMNIPAQUE IOHEXOL 300 MG/ML  SOLN  COMPARISON:  December 12, 2013  FINDINGS: Lung bases are clear.  Liver is prominent measuring 20.6 cm in length. There appears to be fatty change in the liver. No focal liver lesions are identified. Gallbladder wall is not thickened. There is no biliary duct dilatation.  There is a lap band at the gastric cardia.  Spleen, pancreas, and adrenals appear normal. Kidneys bilaterally show no evidence of mass or hydronephrosis. There is no renal or ureteral calculus on either side.  In the pelvis, there is slightly less inflammation involving the proximal sigmoid colon. There is some subtle mesenteric stranding and thickening of the wall  in the proximal sigmoid colon. There is no microperforation or new inflammation. No abscess is appreciable in this area of diverticulitis.  There is no pelvic mass or fluid collection. Fat is noted in each inguinal ring. Urinary bladder is midline with normal wall thickness. Appendix appears normal.  There is no appreciable bowel obstruction. There is no free air or portal venous air.  There is no ascites, adenopathy, or abscess in the abdomen or pelvis. There is no abdominal aortic aneurysm. There are no blastic or lytic bone lesions.  IMPRESSION: Slightly less inflammatory change in the area of proximal sigmoid diverticulitis. No new inflammatory focus seen on this study. No abscess. Debris remains in the diverticulum which is inflamed in the proximal sigmoid colon.  Appendix appears normal.  No bowel obstruction. No renal or ureteral calculus. No hydronephrosis.  Liver is enlarged with fatty change. Lap band is present at the gastric cardia.   Electronically Signed   By: Lowella Grip M.D.   On:  12/15/2013 17:14     EKG Interpretation None      MDM   Final diagnoses:  Diverticulitis of intestine without perforation or abscess without bleeding   48 y.o. M with known diverticulitis, complaining of ongoing/worsening pain.  On exam, patient afebrile and nontoxic-appearing. His abdomen is tender without rebound or guarding.  Labs were obtained which are reassuring. We'll obtain repeat CT abdomen pelvis to r/o abscess or perforation.  Likely admission due to failed OP treatment  CT scan with interval improvement of sigmoid diverticulitis.  Pain currently controlled with IV morphine.  Will admit to hospitalist service-- Dr. Rockne Menghini, med surg, team 8.  Will continue IV cipro/flagyl.  Temp admission orders placed, VS remain stable.  Larene Pickett, PA-C 12/15/13 2051

## 2013-12-15 NOTE — H&P (Signed)
History and Physical:    [name]:[id number] DOB: October 22, 1965 DOA: 12/15/2013  Referring physician: Dr. Jeneen Rinks PCP: [name], MD   Chief Complaint: Abdominal pain   History of Present Illness:   Nathaniel Chambers is an 48 y.o. 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  Has seen PCP 3 times, and had a CT scan done on 12/12/13 which showed mild acute diverticulitis of the proximal sigmoid colon. He subsequently was started on Cipro but despite taking these antibiotics, reported no relief.  Has had some associated nausea but no vomiting.  No melena or hematochezia.  No aggravating factors to abdominal pain, but pain medications ease it off a bit. No fever or chills.  Last BM was this a.m. Pain is crampy and colicky, sharp, 20/35 at worst.  ROS:   Constitutional: No fever, no chills;  Appetite normal; No weight loss, no weight gain, + fatigue.  HEENT: No blurry vision, no diplopia, no pharyngitis, no dysphagia CV: No chest pain, no palpitations, no PND, no orthopnea, no edema.  Resp: No SOB, + dry cough, no pleuritic pain. GI: + nausea, no vomiting, no diarrhea, no melena, no hematochezia, no constipation, + abdominal pain.  GU: No dysuria, no hematuria, no frequency, no urgency. MSK: no myalgias, no arthralgias.  Neuro:  No headache, no focal neurological deficits, no history of seizures.  Psych: No depression, no anxiety.  Endo: No heat intolerance, no cold intolerance, no polyuria, no polydipsia  Skin: No rashes, no skin lesions.  Heme: No easy bruising.  Travel history: No recent travel.   Past Medical History:   Past Medical History  Diagnosis Date  . Asthma   . Pneumonia     history of  . 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 Leonides Schanz  .  Meralgia paraesthetica March 2015    Bilaterally  . Diverticulitis     Past Surgical History:   Past Surgical History  Procedure Laterality Date  . Laparoscopic gastric banding  04-26-2007  . Repair recurrent right inguinal hernia  10-22-2008  . Surg. for undescended testicles/ bilateral inguinal hernia repair  AGE 29 MON OLD  . Left shoulder surg  2000    rotator cuff  . Hernia repair  08/2008    rt inguinal  . Shoulder arthroscopy  01/2009    rt   . Shoulder arthroscopy  07/01/2011    Procedure: ARTHROSCOPY SHOULDER;  Surgeon: Magnus Sinning, MD;  Location: [facility];  Service: Orthopedics;  Laterality: Left;  WITH LABRAL DEBRIDEMENT  . Shoulder open rotator cuff repair  07/01/2011    Procedure: ROTATOR CUFF REPAIR SHOULDER OPEN;  Surgeon: Magnus Sinning, MD;  Location: [facility];  Service: Orthopedics;  Laterality: Left;  OPEN DISTAL CLAVICLE REPAIR  . Vasectomy    . Leg surgery      cancer    Social History:   History   Social History  . Marital Status: Married    Spouse Name: N/A    Number of Children: N/A  . Years of Education: N/A   Occupational History  . Not on file.   Social History Main Topics  . Smoking status: Former Smoker -- 0.25 packs/day for 1 years    Quit date: 06/25/1988  .  Smokeless tobacco: Former Systems developer     Comment: smoked socially  . Alcohol Use: No  . Drug Use: No  . Sexual Activity: Not on file   Other Topics Concern  . Not on file   Social History Narrative   He works in Therapist, art for Engineer, drilling.   He lives with his wife and they have three daughters.   Highest level of education:  Some college          Family history:   Family History  Problem Relation Age of Onset  . Heart disease Father   . Cancer Mother     kidney, spleen    Allergies   Penicillins; Solu-medrol; Dilaudid; Oxycodone; and Percocet  Current Medications:   Prior to Admission medications   Medication Sig  Start Date End Date Taking? Authorizing Provider  albuterol (PROVENTIL HFA;VENTOLIN HFA) 108 (90 BASE) MCG/ACT inhaler Inhale 2 puffs into the lungs every 6 (six) hours as needed for wheezing or shortness of breath.    Yes Historical Provider, MD  Fluticasone-Salmeterol (ADVAIR) 250-50 MCG/DOSE AEPB Inhale 1 puff into the lungs every 12 (twelve) hours.   Yes Historical Provider, MD  lidocaine (XYLOCAINE) 5 % ointment Apply to lateral thigh twice daily as needed.  Wear gloves when applying medication. 11/02/13  Yes Donika K Patel, DO  metroNIDAZOLE (FLAGYL) 500 MG tablet Take 500 mg by mouth 3 (three) times daily.   Yes Historical Provider, MD  morphine (MSIR) 15 MG tablet Take 1-2 tablets (15-30 mg total) by mouth every 6 (six) hours as needed for moderate pain or severe pain. 12/13/13  Yes Houston Siren III, MD  ondansetron (ZOFRAN) 4 MG tablet Take 1 tablet (4 mg total) by mouth every 6 (six) hours. 12/13/13  Yes Houston Siren III, MD    Physical Exam:   Filed Vitals:   12/15/13 1442 12/15/13 1632 12/15/13 1645  BP: 136/78 148/89 142/84  Pulse: 72 63 63  Temp: 98.3 F (36.8 C)    TempSrc: Oral    Resp: 18 17   SpO2: 96% 97% 98%     Physical Exam: Blood pressure 142/84, pulse 63, temperature 98.3 F (36.8 C), temperature source Oral, resp. rate 17, SpO2 98.00%. Gen: No acute distress. Head: Normocephalic, atraumatic. Eyes: PERRL, EOMI, sclerae nonicteric. Mouth: Oropharynx clear with no tonsillar or posterior pharyngeal exudates. Neck: Supple, no thyromegaly, no lymphadenopathy, no jugular venous distention. Chest: Lungs clear to auscultation bilaterally. CV: Heart sounds are regular. No murmurs, rubs, or gallops. Abdomen: Soft, tender in the lower right lower and left lower quadrant, nondistended with normal active bowel sounds. Extremities: Extremities without clubbing, edema, or cyanosis. Skin: Warm and dry. Neuro: Alert and oriented times 3; cranial nerves II through  XII grossly intact. Psych: Mood and affect normal.   Data Review:    Labs: Basic Metabolic Panel:  Recent Labs Lab 12/13/13 1453 12/15/13 1506  NA 139 138  K 4.2 3.7  CL 104 102  CO2 23 23  GLUCOSE 107* 125*  BUN 9 8  CREATININE 1.01 1.07  CALCIUM 9.3 9.0   Liver Function Tests:  Recent Labs Lab 12/13/13 1453 12/15/13 1506  AST 18 20  ALT 18 20  ALKPHOS 79 72  BILITOT 0.3 0.3  PROT 7.6 7.4  ALBUMIN 3.8 3.5    Recent Labs Lab 12/13/13 1453 12/15/13 1506  LIPASE 80* 61*   CBC:  Recent Labs Lab 12/13/13 1453 12/15/13 1506  WBC 8.9 8.2  NEUTROABS 4.3 4.8  HGB 13.8 13.5  HCT 42.6 41.4  MCV 83.0 84.1  PLT 331 327   CBG: No results found for this basename: GLUCAP,  in the last 168 hours  Radiographic Studies: Ct Abdomen Pelvis W Contrast  12/15/2013   CLINICAL DATA:  Abdominal pain; history of diverticulitis  EXAM: CT ABDOMEN AND PELVIS WITH CONTRAST  TECHNIQUE: Multidetector CT imaging of the abdomen and pelvis was performed using the standard protocol following bolus administration of intravenous contrast. Oral contrast was also administered.  CONTRAST:  169mL OMNIPAQUE IOHEXOL 300 MG/ML  SOLN  COMPARISON:  December 12, 2013  FINDINGS: Lung bases are clear.  Liver is prominent measuring 20.6 cm in length. There appears to be fatty change in the liver. No focal liver lesions are identified. Gallbladder wall is not thickened. There is no biliary duct dilatation.  There is a lap band at the gastric cardia.  Spleen, pancreas, and adrenals appear normal. Kidneys bilaterally show no evidence of mass or hydronephrosis. There is no renal or ureteral calculus on either side.  In the pelvis, there is slightly less inflammation involving the proximal sigmoid colon. There is some subtle mesenteric stranding and thickening of the wall in the proximal sigmoid colon. There is no microperforation or new inflammation. No abscess is appreciable in this area of diverticulitis.   There is no pelvic mass or fluid collection. Fat is noted in each inguinal ring. Urinary bladder is midline with normal wall thickness. Appendix appears normal.  There is no appreciable bowel obstruction. There is no free air or portal venous air.  There is no ascites, adenopathy, or abscess in the abdomen or pelvis. There is no abdominal aortic aneurysm. There are no blastic or lytic bone lesions.  IMPRESSION: Slightly less inflammatory change in the area of proximal sigmoid diverticulitis. No new inflammatory focus seen on this study. No abscess. Debris remains in the diverticulum which is inflamed in the proximal sigmoid colon.  Appendix appears normal.  No bowel obstruction. No renal or ureteral calculus. No hydronephrosis.  Liver is enlarged with fatty change. Lap band is present at the gastric cardia.   Electronically Signed   By: Lowella Grip M.D.   On: 12/15/2013 17:14     Assessment/Plan:   Principal Problem:   Diverticulitis  Failed outpatient therapy, admit for IV antibiotics with Cipro/Flagyl and pain control.  May need GI consultation if symptoms don't improve on IV antibiotics.  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    Asthma  Stable, no wheezing.  Continue albuterol as needed and Advair.    Fatty liver  LFTs okay.  Recommend weight loss. Dietitian consultation for diet education.  DVT prophylaxis  SCDs. With large diverticula, at risk for diverticular bleed.  Code Status: Full. Family Communication: Andee Poles (wife) [phone]. Disposition Plan: Home when stable.  Time spent: One hour.  RAMA,CHRISTINA [facility] Pager [phone] Cell: [phone]   If 7PM-7AM, please contact night-coverage [email or web address] Password [facility] 12/15/2013, 6:14 PM    **Disclaimer: This note was dictated with voice recognition software. Similar sounding words can  inadvertently be transcribed and this note may contain transcription errors which may not have been corrected upon publication of note.**

## 2013-12-15 NOTE — Progress Notes (Signed)
Ur completed     

## 2013-12-15 NOTE — ED Notes (Signed)
Abdominal pain being sent from pcp

## 2013-12-15 NOTE — ED Notes (Signed)
MD at bedside. 

## 2013-12-15 NOTE — ED Notes (Signed)
Pt sent by PCP and reports abdominal pain x 6 days. Has been on abx for 4 days. Pt pain 10/0. Denies diarrhea but is positive for nausea but he say "I think it is from the medicine. Last BM this am.

## 2013-12-15 NOTE — ED Notes (Signed)
Attempted calling report to floor. Will call back promptly.

## 2013-12-16 ENCOUNTER — Encounter (HOSPITAL_COMMUNITY): Payer: Self-pay | Admitting: *Deleted

## 2013-12-16 LAB — BASIC METABOLIC PANEL
Anion gap: 11 (ref 5–15)
BUN: 8 mg/dL (ref 6–23)
CHLORIDE: 103 meq/L (ref 96–112)
CO2: 26 meq/L (ref 19–32)
CREATININE: 1.18 mg/dL (ref 0.50–1.35)
Calcium: 9 mg/dL (ref 8.4–10.5)
GFR calc Af Amer: 83 mL/min — ABNORMAL LOW (ref >90)
GFR calc non Af Amer: 71 mL/min — ABNORMAL LOW (ref >90)
Glucose, Bld: 94 mg/dL (ref 70–99)
Potassium: 4 mEq/L (ref 3.7–5.3)
Sodium: 140 mEq/L (ref 137–147)

## 2013-12-16 LAB — CBC
HEMATOCRIT: 40.4 % (ref 39.0–52.0)
HEMOGLOBIN: 13.1 g/dL (ref 13.0–17.0)
MCH: 27.4 pg (ref 26.0–34.0)
MCHC: 32.4 g/dL (ref 30.0–36.0)
MCV: 84.5 fL (ref 78.0–100.0)
Platelets: 305 10*3/uL (ref 150–400)
RBC: 4.78 MIL/uL (ref 4.22–5.81)
RDW: 15.3 % (ref 11.5–15.5)
WBC: 8.6 10*3/uL (ref 4.0–10.5)

## 2013-12-16 MED ORDER — PROMETHAZINE HCL 25 MG/ML IJ SOLN
12.5000 mg | INTRAMUSCULAR | Status: DC | PRN
  Administered 2013-12-16 – 2013-12-21 (×6): 12.5 mg via INTRAVENOUS
  Filled 2013-12-16 (×6): qty 1

## 2013-12-16 NOTE — Progress Notes (Signed)
Late entry. Pt admitted from ED, wife at bedside, alert x 4, v/s stable, belongings with Pt, skin dry and intact, n/o pain. Will monitor. Jeanie Sewer, RN 2:27 AM 12/16/2013

## 2013-12-16 NOTE — Progress Notes (Signed)
Progress Note   MELCHOR KIRCHGESSNER LFY:[id number] DOB: 02-06-1966 DOA: 12/15/2013 PCP: [name], MD   Brief Narrative:   Nathaniel Chambers is an 48 y.o. 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  Assessment/Plan:   Principal Problem:  Diverticulitis  Failed outpatient therapy, admitted for IV antibiotics with Cipro/Flagyl and pain control.  May need GI consultation if symptoms don't improve on IV antibiotics.  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  Asthma  Stable, no wheezing.  Continue albuterol as needed and Advair.  Fatty liver  LFTs okay.  Recommend weight loss. Dietitian consultation for diet education.  DVT prophylaxis  SCDs. With large diverticula, at risk for diverticular bleed.  Code Status: Full.  Family Communication: Andee Poles (wife) [phone] updated at bedside.  Disposition Plan: Home when stable.    IV Access:    Peripheral IV   Procedures and diagnostic studies:    CT abdomen and pelvis with contrast 12/15/13: Slightly less inflammatory change in the area of proximal sigmoid diverticulitis. No new inflammatory focus seen on this study. No abscess. Debris remains in the diverticulum which is inflamed in the proximal sigmoid colon. Appendix appears normal. No bowel obstruction. No renal or ureteral calculus. No hydronephrosis. Liver is enlarged with fatty change. Lap band is present at the gastric cardia.    Medical Consultants:    None.   Other Consultants:    None.   Anti-Infectives:    Cipro 12/15/13--->  Flagyl 12/15/13--->  Subjective:    Doran Heater continues to  have significant right lower quadrant pain and some intermittent nausea. No vomiting. Bowels have moved in the last 24 hours. Afebrile.  Objective:    Filed Vitals:   12/15/13 2010 12/15/13 2057 12/16/13 0600 12/16/13 0849  BP: 154/90 158/88 123/75   Pulse: 66 62 64   Temp:  98.1 F (36.7 C) 98.4 F (36.9 C)   TempSrc:  Oral Oral   Resp: 18 20 20    Height:  6\' 2"  (1.88 m)    Weight:  151.32 kg (333 lb 9.6 oz)    SpO2: 99% 98% 96% 92%    Intake/Output Summary (Last 24 hours) at 12/16/13 0918 Last data filed at 12/16/13 0700  Gross per 24 hour  Intake    600 ml  Output   2100 ml  Net  -1500 ml    Exam: Gen:  NAD Cardiovascular:  RRR, No M/R/G Respiratory:  Lungs CTAB Gastrointestinal:  Abdomen soft, tender right lower quadrant, + BS Extremities:  No C/E/C   Data Reviewed:    Labs: Basic Metabolic Panel:  Recent Labs Lab 12/13/13 1453 12/15/13 1506 12/16/13 0525  NA 139 138 140  K 4.2 3.7 4.0  CL 104 102 103  CO2 23 23 26   GLUCOSE 107* 125* 94  BUN 9 8 8   CREATININE 1.01 1.07 1.18  CALCIUM 9.3 9.0 9.0   GFR Estimated Creatinine Clearance: 118.9 ml/min (by C-G formula based on Cr of 1.18). Liver Function Tests:  Recent Labs Lab 12/13/13 1453 12/15/13 1506  AST 18 20  ALT 18 20  ALKPHOS 79 72  BILITOT 0.3 0.3  PROT 7.6 7.4  ALBUMIN 3.8  3.5    Recent Labs Lab 12/13/13 1453 12/15/13 1506  LIPASE 80* 61*   CBC:  Recent Labs Lab 12/13/13 1453 12/15/13 1506 12/16/13 0525  WBC 8.9 8.2 8.6  NEUTROABS 4.3 4.8  --   HGB 13.8 13.5 13.1  HCT 42.6 41.4 40.4  MCV 83.0 84.1 84.5  PLT 331 327 305   Sepsis Labs:  Recent Labs Lab 12/13/13 1453 12/15/13 1506 12/15/13 1631 12/16/13 0525  WBC 8.9 8.2  --  8.6  LATICACIDVEN  --   --  0.81  --    Microbiology No results found for this or any previous visit (from the past 240 hour(s)).   Medications:   . ciprofloxacin  400 mg Intravenous Q12H  . metronidazole  500 mg Intravenous Q8H  .  mometasone-formoterol  2 puff Inhalation BID   Continuous Infusions: . sodium chloride 50 mL/hr at 12/15/13 2323    Time spent: 25 minutes.   LOS: 1 day   Ilyse Tremain  [facility] Pager [phone]. If unable to reach me by pager, please call my cell phone at [phone].  *Please refer to [email or web address], password [email or web address] to get updated schedule on who will round on this patient, as hospitalists switch teams weekly. If 7PM-7AM, please contact night-coverage at [email or web address], password [email or web address] for any overnight needs.  12/16/2013, 9:18 AM

## 2013-12-17 MED ORDER — MORPHINE SULFATE 15 MG PO TABS
15.0000 mg | ORAL_TABLET | Freq: Four times a day (QID) | ORAL | Status: DC | PRN
  Administered 2013-12-18 – 2013-12-20 (×4): 30 mg via ORAL
  Filled 2013-12-17: qty 2
  Filled 2013-12-17: qty 1
  Filled 2013-12-17 (×3): qty 2

## 2013-12-17 MED ORDER — KETOROLAC TROMETHAMINE 30 MG/ML IJ SOLN
30.0000 mg | Freq: Four times a day (QID) | INTRAMUSCULAR | Status: DC | PRN
  Administered 2013-12-17: 30 mg via INTRAVENOUS
  Filled 2013-12-17: qty 1

## 2013-12-17 NOTE — Progress Notes (Signed)
Progress Note   Nathaniel Chambers MPN:[id number] DOB: Apr 16, 1966 DOA: 12/15/2013 PCP: [name], MD   Brief Narrative:   Nathaniel Chambers is an 48 y.o. 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  Assessment/Plan:   Principal Problem:  Diverticulitis  Failed outpatient therapy, admitted for IV antibiotics with Cipro/Flagyl and pain control.  May need GI consultation if symptoms don't improve on IV antibiotics.  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  Asthma  Stable, no wheezing.  Continue albuterol as needed and Advair.  Fatty liver  LFTs okay.  Recommend weight loss. Dietitian consultation for diet education.  DVT prophylaxis  SCDs. With large diverticula, at risk for diverticular bleed.  Code Status: Full.  Family Communication: Andee Poles (wife) [phone] updated at bedside 12/16/13.  Disposition Plan: Home when stable.    IV Access:    Peripheral IV   Procedures and diagnostic studies:    CT abdomen and pelvis with contrast 12/15/13: Slightly less inflammatory change in the area of proximal sigmoid diverticulitis. No new inflammatory focus seen on this study. No abscess. Debris remains in the diverticulum which is inflamed in the proximal sigmoid colon. Appendix appears normal. No bowel obstruction. No renal or ureteral calculus. No hydronephrosis. Liver is enlarged with fatty change. Lap band is present at the gastric cardia.    Medical Consultants:    None.   Other Consultants:    None.   Anti-Infectives:    Cipro 12/15/13--->  Flagyl 12/15/13--->  Subjective:   Nathaniel Chambers  continues to report right lower quadrant pain and some nausea although his appetite is good and he is eating well. Says he had a small bowel movement yesterday.  Objective:    Filed Vitals:   12/16/13 2220 12/17/13 0701 12/17/13 0742 12/17/13 1401  BP: 134/78 142/71  147/84  Pulse: 62 67  76  Temp: 98.1 F (36.7 C) 97.7 F (36.5 C)  98.5 F (36.9 C)  TempSrc: Oral Oral  Oral  Resp: 18 20  18   Height:      Weight:      SpO2: 98% 98% 96% 99%    Intake/Output Summary (Last 24 hours) at 12/17/13 1558 Last data filed at 12/17/13 1402  Gross per 24 hour  Intake 1282.5 ml  Output   2100 ml  Net -817.5 ml    Exam: Gen:  NAD Cardiovascular:  RRR, No M/R/G Respiratory:  Lungs CTAB Gastrointestinal:  Abdomen soft, tender right lower quadrant, + BS Extremities:  No C/E/C   Data Reviewed:    Labs: Basic Metabolic Panel:  Recent Labs Lab 12/13/13 1453 12/15/13 1506 12/16/13 0525  NA 139 138 140  K 4.2 3.7 4.0  CL 104 102 103  CO2 23 23 26   GLUCOSE 107* 125* 94  BUN 9 8 8   CREATININE 1.01 1.07 1.18  CALCIUM 9.3 9.0 9.0   GFR Estimated Creatinine Clearance: 118.9 ml/min (by C-G formula based on Cr of 1.18). Liver Function Tests:  Recent Labs Lab 12/13/13 1453 12/15/13 1506  AST 18 20  ALT 18 20  ALKPHOS 79 72  BILITOT 0.3 0.3  PROT 7.6 7.4  ALBUMIN 3.8 3.5  Recent Labs Lab 12/13/13 1453 12/15/13 1506  LIPASE 80* 61*   CBC:  Recent Labs Lab 12/13/13 1453 12/15/13 1506 12/16/13 0525  WBC 8.9 8.2 8.6  NEUTROABS 4.3 4.8  --   HGB 13.8 13.5 13.1  HCT 42.6 41.4 40.4  MCV 83.0 84.1 84.5  PLT 331 327 305   Sepsis Labs:  Recent Labs Lab 12/13/13 1453 12/15/13 1506 12/15/13 1631 12/16/13 0525  WBC 8.9 8.2  --  8.6  LATICACIDVEN  --   --  0.81  --    Microbiology No results found for this or any previous visit (from the past 240 hour(s)).   Medications:   . ciprofloxacin  400 mg Intravenous Q12H  . metronidazole  500 mg Intravenous  Q8H  . mometasone-formoterol  2 puff Inhalation BID   Continuous Infusions: . sodium chloride 50 mL/hr at 12/15/13 2323    Time spent: 25 minutes.   LOS: 2 days   RAMA,CHRISTINA  [facility] Pager [phone]. If unable to reach me by pager, please call my cell phone at [phone].  *Please refer to [email or web address], password [email or web address] to get updated schedule on who will round on this patient, as hospitalists switch teams weekly. If 7PM-7AM, please contact night-coverage at [email or web address], password [email or web address] for any overnight needs.  12/17/2013, 3:58 PM

## 2013-12-17 NOTE — Plan of Care (Signed)
Problem: Food- and Nutrition-Related Knowledge Deficit (NB-1.1) Goal: Nutrition education Formal process to instruct or train a patient/client in a skill or to impart knowledge to help patients/clients voluntarily manage or modify food choices and eating behavior to maintain or improve health. Outcome: Completed/Met Date Met:  12/17/13  RD consulted for nutrition education regarding weight loss/fatty liver. Patient also with recently diagnosed diverticulitis. Patient with history of weight loss attempts including following a 600 calorie diet, but reports every time he tries, he has setbacks. He is unsure of how he should be eating.   Body mass index is 42.81 kg/(m^2). Pt meets criteria for morbid obesity based on current BMI.  RD provided "Weight Loss Tips" and "1800 calorie 5 day meal plan" handouts from the Academy of [employer]etics. Emphasized the importance of serving sizes and provided examples of correct portions of common foods. Discussed importance of controlled and consistent intake throughout the day. Provided examples of ways to balance meals/snacks and encouraged intake of high-fiber, whole grain complex carbohydrates. Emphasized the importance of hydration with calorie-free beverages and limiting sugar-sweetened beverages. Encouraged pt to discuss physical activity options with physician. Teach back method used.  Patient also provided with a "Low Fiber Nutrition Therapy" handout. We discussed low fiber foods. Patient advised to follow this diet until diverticulitis resolved, then slowly transition to high fiber diet as tolerated.   Expect good compliance.  Current diet order is Soft, patient is consuming approximately 100% of meals at this time. Labs and medications reviewed. No further nutrition interventions warranted at this time. RD contact information provided. If additional nutrition issues arise, please re-consult RD.  Larey Seat, RD, LDN Pager #:  [phone] After-Hours Pager #: [phone]

## 2013-12-18 ENCOUNTER — Inpatient Hospital Stay (HOSPITAL_COMMUNITY): Payer: BC Managed Care – PPO

## 2013-12-18 DIAGNOSIS — J45909 Unspecified asthma, uncomplicated: Secondary | ICD-10-CM

## 2013-12-18 DIAGNOSIS — I10 Essential (primary) hypertension: Secondary | ICD-10-CM

## 2013-12-18 DIAGNOSIS — K5732 Diverticulitis of large intestine without perforation or abscess without bleeding: Principal | ICD-10-CM

## 2013-12-18 DIAGNOSIS — K7689 Other specified diseases of liver: Secondary | ICD-10-CM

## 2013-12-18 MED ORDER — HYDRALAZINE HCL 20 MG/ML IJ SOLN
10.0000 mg | Freq: Four times a day (QID) | INTRAMUSCULAR | Status: DC | PRN
  Administered 2013-12-20: 10 mg via INTRAVENOUS
  Filled 2013-12-18: qty 1

## 2013-12-18 NOTE — Progress Notes (Signed)
Patient ID: Nathaniel Chambers, male   DOB: 26-Apr-1966, 48 y.o.   MRN: [id number] [facility] PROGRESS NOTE  Nathaniel Chambers:[id number] DOB: 07-08-1965 DOA: 12/15/2013 PCP: [name], MD  Brief narrative: 48 y.o. 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   Assessment/Plan:   Principal Problem:  Diverticulitis  Failed outpatient therapy, admitted for IV antibiotics with Cipro/Flagyl and pain control.  Pt reported now having pain in right upper quadrant. Will obtain abdominal US for further evaluation.  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 Hypertension  Order placed for hydralazine 10 mg IV PRN for better BP control. HTN likely due to pain.  Asthma  Stable, no wheezing.  Continue albuterol as needed and Advair. Fatty liver  LFTs okay.  Recommend weight loss. Dietitian consultation for diet education. DVT prophylaxis  SCDs. With large diverticula, at risk for diverticular bleed.   Code Status: Full.  Family Communication: Andee Poles (wife) [phone] updated at bedside 12/16/13. Family not at the bedside 12/18/2013. Disposition Plan: Home when stable.    IV Access:   Peripheral IV Procedures and diagnostic studies:   CT abdomen and pelvis with contrast 12/15/13: Slightly less inflammatory change in the area of proximal sigmoid diverticulitis. No new inflammatory focus seen on this study. No abscess. Debris remains in the diverticulum which is inflamed in the proximal sigmoid colon. Appendix appears normal. No bowel obstruction. No renal or ureteral calculus. No hydronephrosis. Liver is enlarged with fatty change. Lap  band is present at the gastric cardia. US Abdomen Limited 12/18/2013   - pending. Medical Consultants:   None. Other Consultants:   None. Anti-Infectives:   Cipro 12/15/13--->  Flagyl 12/15/13--->    [name], MD  [facility] Pager [phone]  If 7PM-7AM, please contact night-coverage [email or web address] Password [facility] 12/18/2013, 6:37 PM   LOS: 3 days    HPI/Subjective: No acute overnight events.  Objective: Filed Vitals:   12/17/13 2119 12/18/13 0545 12/18/13 0823 12/18/13 1510  BP: 154/79 140/86  162/87  Pulse: 71 59  84  Temp: 98.2 F (36.8 C) 97.5 F (36.4 C)  99.6 F (37.6 C)  TempSrc: Oral Oral  Oral  Resp: 18 18  20   Height:      Weight:      SpO2: 98% 97% 98% 99%    Intake/Output Summary (Last 24 hours) at 12/18/13 1837 Last data filed at 12/18/13 1047  Gross per 24 hour  Intake    980 ml  Output      0 ml  Net    980 ml    Exam:   General:  Pt is alert, follows commands appropriately, not in acute distress  Cardiovascular: Regular rate and rhythm, S1/S2, no murmurs  Respiratory: Clear to auscultation bilaterally, no wheezing, no crackles, no rhonchi  Abdomen: obese, tender in mid abdomen and right upper quadrant; no distention, bowel sounds present  Extremities: No edema, pulses DP and PT palpable bilaterally  Neuro: Grossly nonfocal  Data Reviewed: Basic Metabolic Panel:  Recent Labs Lab 12/13/13 1453 12/15/13 1506 12/16/13 0525  NA 139 138 140  K 4.2 3.7 4.0  CL 104 102 103  CO2 23 23 26   GLUCOSE 107* 125*  94  BUN 9 8 8   CREATININE 1.01 1.07 1.18  CALCIUM 9.3 9.0 9.0   Liver Function Tests:  Recent Labs Lab 12/13/13 1453 12/15/13 1506  AST 18 20  ALT 18 20  ALKPHOS 79 72  BILITOT 0.3 0.3  PROT 7.6 7.4  ALBUMIN 3.8 3.5    Recent Labs Lab 12/13/13 1453 12/15/13 1506  LIPASE 80* 61*   No results found for this basename: AMMONIA,  in the last 168 hours CBC:  Recent Labs Lab 12/13/13 1453 12/15/13 1506  12/16/13 0525  WBC 8.9 8.2 8.6  NEUTROABS 4.3 4.8  --   HGB 13.8 13.5 13.1  HCT 42.6 41.4 40.4  MCV 83.0 84.1 84.5  PLT 331 327 305   Cardiac Enzymes: No results found for this basename: CKTOTAL, CKMB, CKMBINDEX, TROPONINI,  in the last 168 hours BNP: No components found with this basename: POCBNP,  CBG: No results found for this basename: GLUCAP,  in the last 168 hours  No results found for this or any previous visit (from the past 240 hour(s)).   Scheduled Meds: . ciprofloxacin  400 mg Intravenous Q12H  . metronidazole  500 mg Intravenous Q8H  . mometasone-formoterol  2 puff Inhalation BID   Continuous Infusions: . sodium chloride 50 mL/hr at 12/18/13 [phone]

## 2013-12-18 NOTE — Progress Notes (Signed)
CARE MANAGEMENT NOTE 12/18/2013  Patient:  ALDIN, DREES   Account Number:  [id number]  Date Initiated:  12/18/2013  Documentation initiated by:  DAVIS,RHONDA  Subjective/Objective Assessment:   concurrent review for patient with diverticulitis, continued abd pain, low grade temp, iv abx     Action/Plan:   home when stable   Anticipated DC Date:  12/21/2013   Anticipated DC Plan:  [place]  NA      [facility] Choice  NA   Choice offered to / List presented to:  NA      DME agency  NA     [place] arranged  NA      [place] agency  NA   Status of service:  In process, will continue to follow Medicare Important Message given?  NA - LOS <3 / Initial given by admissions (If response is "NO", the following Medicare IM given date fields will be blank) Date Medicare IM given:   Medicare IM given by:   Date Additional Medicare IM given:   Additional Medicare IM given by:    Discharge Disposition:    Per UR Regulation:  Reviewed for med. necessity/level of care/duration of stay  If discussed at [place] of Stay Meetings, dates discussed:    Comments:  Rhonda Davis,RN,BSn,CCM:

## 2013-12-19 DIAGNOSIS — R748 Abnormal levels of other serum enzymes: Secondary | ICD-10-CM | POA: Diagnosis present

## 2013-12-19 LAB — LIPASE, BLOOD: Lipase: 29 U/L (ref 11–59)

## 2013-12-19 NOTE — Progress Notes (Addendum)
Progress Note   NEWELL WAFER RJJ:[id number] DOB: 1966-03-16 DOA: 12/15/2013 PCP: [name], MD   Brief Narrative:   Nathaniel Chambers is an 48 y.o. 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  Assessment/Plan:   Principal Problem:  Diverticulitis with elevated lipase  Failed outpatient therapy, admitted for IV antibiotics with Cipro/Flagyl and pain control.  May need GI consultation if symptoms don't improve on IV antibiotics. Abdominal ultrasound done 12/18/13 secondary to new complaints of RUQ pain.  Small amount of gallbladder sludge and hepatic enlargement noted. Given ongoing abdominal pain and mildly elevated lipase on admission, repeat lipase.  Active Problems:  Hypertension  Blood pressure has been high. Continue hydralazine as needed.  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  Asthma  Stable, no wheezing.  Continue albuterol as needed and Advair.  Fatty liver  LFTs okay.  Recommend weight loss. Dietitian consultation for diet education.  DVT prophylaxis  SCDs. With large diverticula, at risk for diverticular bleed.  Code Status: Full.  Family Communication: Andee Poles (wife) [phone] updated at bedside 12/16/13.  Disposition Plan: Home when stable.    IV Access:    Peripheral IV   Procedures and diagnostic studies:    CT abdomen and pelvis with contrast 12/15/13: Slightly less inflammatory change in the area of proximal sigmoid diverticulitis. No new inflammatory focus seen on this study. No abscess. Debris remains in the diverticulum which is inflamed in the proximal sigmoid colon. Appendix appears normal. No  bowel obstruction. No renal or ureteral calculus. No hydronephrosis. Liver is enlarged with fatty change. Lap band is present at the gastric cardia.   Abdominal ultrasound 12/18/13: 1. Small amount of gallbladder sludge. 2. Hepatic enlargement.  Medical Consultants:    None.   Other Consultants:    None.   Anti-Infectives:    Cipro 12/15/13--->  Flagyl 12/15/13--->  Subjective:   Doran Heater continues to report abdominal pain, now in the right upper, mid, and left upper abdomen. No diarrhea, black or bloody stools. Ongoing nausea but eating well.  Objective:    Filed Vitals:   12/18/13 1510 12/18/13 1951 12/18/13 2100 12/19/13 0646  BP: 162/87  144/78 142/75  Pulse: 84  72 63  Temp: 99.6 F (37.6 C)  98.7 F (37.1 C) 98 F (36.7 C)  TempSrc: Oral  Oral Oral  Resp: 20  18 18   Height:      Weight:      SpO2: 99% 96% 98% 99%    Intake/Output Summary (Last 24 hours) at 12/19/13 0742 Last data filed at 12/19/13 3235  Gross per 24 hour  Intake 2934.17 ml  Output    300 ml  Net 2634.17 ml    Exam: Gen:  NAD Cardiovascular:  RRR, No M/R/G Respiratory:  Lungs CTAB Gastrointestinal:  Abdomen soft, tender right and left upper quadrant/midepigastric area Extremities:  No C/E/C   Data Reviewed:    Labs: Basic Metabolic Panel:  Recent Labs Lab 12/13/13 1453 12/15/13 1506 12/16/13 0525  NA 139 138 140  K 4.2 3.7 4.0  CL 104 102 103  CO2 23 23 26   GLUCOSE 107* 125* 94  BUN 9 8 8   CREATININE 1.01 1.07  1.18  CALCIUM 9.3 9.0 9.0   GFR Estimated Creatinine Clearance: 118.9 ml/min (by C-G formula based on Cr of 1.18). Liver Function Tests:  Recent Labs Lab 12/13/13 1453 12/15/13 1506  AST 18 20  ALT 18 20  ALKPHOS 79 72  BILITOT 0.3 0.3  PROT 7.6 7.4  ALBUMIN 3.8 3.5    Recent Labs Lab 12/13/13 1453 12/15/13 1506  LIPASE 80* 61*   CBC:  Recent Labs Lab 12/13/13 1453 12/15/13 1506 12/16/13 0525  WBC 8.9 8.2 8.6  NEUTROABS 4.3  4.8  --   HGB 13.8 13.5 13.1  HCT 42.6 41.4 40.4  MCV 83.0 84.1 84.5  PLT 331 327 305   Sepsis Labs:  Recent Labs Lab 12/13/13 1453 12/15/13 1506 12/15/13 1631 12/16/13 0525  WBC 8.9 8.2  --  8.6  LATICACIDVEN  --   --  0.81  --    Microbiology No results found for this or any previous visit (from the past 240 hour(s)).   Medications:   . ciprofloxacin  400 mg Intravenous Q12H  . metronidazole  500 mg Intravenous Q8H  . mometasone-formoterol  2 puff Inhalation BID   Continuous Infusions: . sodium chloride 50 mL/hr at 12/19/13 0537    Time spent: 25 minutes.   LOS: 4 days   [facility] Pager [phone]. If unable to reach me by pager, please call my cell phone at [phone].  *Please refer to [email or web address], password [email or web address] to get updated schedule on who will round on this patient, as hospitalists switch teams weekly. If 7PM-7AM, please contact night-coverage at [email or web address], password [email or web address] for any overnight needs.  12/19/2013, 7:42 AM

## 2013-12-19 NOTE — ED Provider Notes (Signed)
Medical screening examination/treatment/procedure(s) were conducted as a shared visit with non-physician practitioner(s) and myself.  I personally evaluated the patient during the encounter.   EKG Interpretation None      Pt seen and evaluated.  C/O persistent, severe symptoms despite treatment.  Abdominal exam with LLQ and suprapubic tenderness, but no peritoneal irritation.  Agree with admission for further care, for failed out patient treatment, without complication, of diverticulitis.  Tanna Furry, MD 12/19/13 [phone]

## 2013-12-20 ENCOUNTER — Inpatient Hospital Stay (HOSPITAL_COMMUNITY): Payer: BC Managed Care – PPO

## 2013-12-20 DIAGNOSIS — R748 Abnormal levels of other serum enzymes: Secondary | ICD-10-CM

## 2013-12-20 MED ORDER — KETOROLAC TROMETHAMINE 30 MG/ML IJ SOLN
30.0000 mg | Freq: Four times a day (QID) | INTRAMUSCULAR | Status: DC | PRN
  Filled 2013-12-20: qty 1

## 2013-12-20 MED ORDER — POLYETHYLENE GLYCOL 3350 17 G PO PACK
17.0000 g | PACK | Freq: Every day | ORAL | Status: DC
  Administered 2013-12-20 – 2013-12-21 (×2): 17 g via ORAL
  Filled 2013-12-20 (×2): qty 1

## 2013-12-20 MED ORDER — MORPHINE SULFATE 15 MG PO TABS
15.0000 mg | ORAL_TABLET | ORAL | Status: DC | PRN
  Administered 2013-12-20 – 2013-12-21 (×2): 30 mg via ORAL
  Filled 2013-12-20 (×2): qty 2

## 2013-12-20 MED ORDER — KETOROLAC TROMETHAMINE 30 MG/ML IJ SOLN
30.0000 mg | Freq: Once | INTRAMUSCULAR | Status: DC
  Filled 2013-12-20: qty 1

## 2013-12-20 MED ORDER — CIPROFLOXACIN HCL 500 MG PO TABS
500.0000 mg | ORAL_TABLET | Freq: Two times a day (BID) | ORAL | Status: DC
  Administered 2013-12-20 – 2013-12-21 (×2): 500 mg via ORAL
  Filled 2013-12-20 (×5): qty 1

## 2013-12-20 MED ORDER — PROMETHAZINE HCL 12.5 MG PO TABS
12.5000 mg | ORAL_TABLET | Freq: Once | ORAL | Status: DC
  Filled 2013-12-20: qty 1

## 2013-12-20 MED ORDER — MORPHINE SULFATE 4 MG/ML IJ SOLN
4.0000 mg | Freq: Four times a day (QID) | INTRAMUSCULAR | Status: DC | PRN
Start: 2013-12-20 — End: 2013-12-21
  Administered 2013-12-21: 4 mg via INTRAVENOUS
  Filled 2013-12-20: qty 1

## 2013-12-20 MED ORDER — METRONIDAZOLE 500 MG PO TABS
500.0000 mg | ORAL_TABLET | Freq: Three times a day (TID) | ORAL | Status: DC
  Administered 2013-12-20 – 2013-12-21 (×3): 500 mg via ORAL
  Filled 2013-12-20 (×5): qty 1

## 2013-12-20 NOTE — Progress Notes (Signed)
Progress Note   Nathaniel Chambers WJX:[id number] DOB: 1966-01-29 DOA: 12/15/2013 PCP: [name], MD   Brief Narrative:   Nathaniel Chambers is an 48 y.o. 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  Assessment/Plan:    Diverticulitis with elevated lipase  Slowly improving; still with some abd pain. Follow up in outpatient setting with GI service for colonoscopy once acute episode resolved Abdominal ultrasound done 12/18/13 secondary to new complaints of RUQ pain.  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  Asthma  Stable, no wheezing.  Continue albuterol and Advair.  Fatty liver  LFTs okay.  Recommend weight loss and low fat diet  DVT prophylaxis  SCDs. (With large diverticula, at risk for diverticular bleed).  Code Status: Full.  Family Communication: no family at bedside during today's visit Disposition Plan: will go home at discharge; most likely in am    IV Access:    Peripheral IV   Procedures and diagnostic studies:    CT abdomen and pelvis with contrast 12/15/13: Slightly less inflammatory change in the area of proximal sigmoid diverticulitis. No new inflammatory focus seen on this study. No abscess. Debris remains in the diverticulum  which is inflamed in the proximal sigmoid colon. Appendix appears normal. No bowel obstruction. No renal or ureteral calculus. No hydronephrosis. Liver is enlarged with fatty change. Lap band is present at the gastric cardia.   Abdominal ultrasound 12/18/13: 1. Small amount of gallbladder sludge. 2. Hepatic enlargement.  Medical Consultants:    None.   Other Consultants:    None.   Anti-Infectives:    Cipro 12/15/13---> transition to PO on 8/26  Flagyl 12/15/13--->transition to PO on 8/26  Subjective:   Nathaniel Chambers continues to report abdominal pain. Patient tolerating diet (low residue); no fever and no vomiting. Still with intermittent nausea,  Objective:    Filed Vitals:   12/19/13 1948 12/19/13 2200 12/20/13 0651 12/20/13 0854  BP:  144/76 146/83   Pulse:  66 66   Temp:  98.6 F (37 C) 97.8 F (36.6 C)   TempSrc:  Oral Oral   Resp:  18 18   Height:      Weight:      SpO2: 96% 98% 97% 97%    Intake/Output Summary (Last 24 hours) at 12/20/13 1430 Last data filed at 12/20/13 1420  Gross per 24 hour  Intake    720 ml  Output   2200 ml  Net  -1480 ml    Exam: Gen:  Complaining of abd pain, no vomiting. Still with intermittent episodes of nausea Cardiovascular:  RRR, No M/R/G Respiratory:  Lungs CTAB Gastrointestinal:  Abdomen soft, tender right and left upper quadrant/midepigastric area; positive BS Extremities:  No Cyanosis or clubbing   Data Reviewed:    Labs: Basic Metabolic Panel:  Recent Labs Lab 12/13/13 1453 12/15/13 1506 12/16/13 0525  NA 139 138 140  K 4.2 3.7 4.0  CL 104 102 103  CO2 23 23 26   GLUCOSE 107* 125* 94  BUN 9 8 8   CREATININE 1.01 1.07 1.18  CALCIUM 9.3 9.0 9.0   GFR Estimated Creatinine Clearance: 118.9 ml/min (by C-G formula based on Cr of 1.18).  Liver Function Tests:  Recent Labs Lab 12/13/13 1453 12/15/13 1506  AST 18 20  ALT 18 20  ALKPHOS 79 72  BILITOT 0.3 0.3  PROT 7.6 7.4  ALBUMIN 3.8 3.5     Recent Labs Lab 12/13/13 1453 12/15/13 1506 12/19/13 1342  LIPASE 80* 61* 29   CBC:  Recent Labs Lab 12/13/13 1453 12/15/13 1506 12/16/13 0525  WBC 8.9 8.2 8.6  NEUTROABS 4.3 4.8  --   HGB 13.8 13.5 13.1  HCT 42.6 41.4 40.4  MCV 83.0 84.1 84.5  PLT 331 327 305   Sepsis Labs:  Recent Labs Lab 12/13/13 1453 12/15/13 1506 12/15/13 1631 12/16/13 0525  WBC 8.9 8.2  --  8.6  LATICACIDVEN  --   --  0.81  --     Medications:   . ciprofloxacin  500 mg Oral BID  . metroNIDAZOLE  500 mg Oral 3 times per day  . mometasone-formoterol  2 puff Inhalation BID  . polyethylene glycol  17 g Oral Daily   Continuous Infusions: . sodium chloride 50 mL/hr at 12/19/13 0537    Time spent: < 30 minutes.   LOS: 5 days   Barton Dubois  [facility] Pager [phone]. If unable to reach me by pager, please call my cell phone at [phone].  *Please refer to [email or web address], password [email or web address] to get updated schedule on who will round on this patient, as hospitalists switch teams weekly. If 7PM-7AM, please contact night-coverage at [email or web address], password [email or web address] for any overnight needs.  12/20/2013, 2:30 PM

## 2013-12-21 LAB — CBC
HEMATOCRIT: 40.9 % (ref 39.0–52.0)
Hemoglobin: 13.1 g/dL (ref 13.0–17.0)
MCH: 27.2 pg (ref 26.0–34.0)
MCHC: 32 g/dL (ref 30.0–36.0)
MCV: 85 fL (ref 78.0–100.0)
Platelets: 309 10*3/uL (ref 150–400)
RBC: 4.81 MIL/uL (ref 4.22–5.81)
RDW: 15.7 % — ABNORMAL HIGH (ref 11.5–15.5)
WBC: 11 10*3/uL — AB (ref 4.0–10.5)

## 2013-12-21 LAB — BASIC METABOLIC PANEL
Anion gap: 12 (ref 5–15)
BUN: 10 mg/dL (ref 6–23)
CHLORIDE: 103 meq/L (ref 96–112)
CO2: 24 mEq/L (ref 19–32)
Calcium: 9.4 mg/dL (ref 8.4–10.5)
Creatinine, Ser: 1.05 mg/dL (ref 0.50–1.35)
GFR calc Af Amer: >90 mL/min (ref >90)
GFR calc non Af Amer: 82 mL/min — ABNORMAL LOW (ref >90)
GLUCOSE: 95 mg/dL (ref 70–99)
Potassium: 4.3 mEq/L (ref 3.7–5.3)
Sodium: 139 mEq/L (ref 137–147)

## 2013-12-21 MED ORDER — MORPHINE SULFATE 15 MG PO TABS: 15.0000 mg | ORAL_TABLET | Freq: Four times a day (QID) | ORAL | Status: DC | PRN

## 2013-12-21 MED ORDER — ONDANSETRON 8 MG PO TBDP: 8.0000 mg | ORAL_TABLET | Freq: Three times a day (TID) | ORAL | Status: DC | PRN

## 2013-12-21 MED ORDER — CIPROFLOXACIN HCL 500 MG PO TABS: 500.0000 mg | ORAL_TABLET | Freq: Two times a day (BID) | ORAL | Status: AC

## 2013-12-21 MED ORDER — POLYETHYLENE GLYCOL 3350 17 G PO PACK: 17.0000 g | PACK | Freq: Every day | ORAL | Status: AC

## 2013-12-21 MED ORDER — METRONIDAZOLE 500 MG PO TABS: 500.0000 mg | ORAL_TABLET | Freq: Three times a day (TID) | ORAL | Status: AC

## 2013-12-21 NOTE — Care Management Note (Signed)
    Page 1 of 2   12/21/2013     1:48:44 PM CARE MANAGEMENT NOTE 12/21/2013  Patient:  Nathaniel Chambers, Nathaniel Chambers   Account Number:  [id number]  Date Initiated:  12/18/2013  Documentation initiated by:  DAVIS,RHONDA  Subjective/Objective Assessment:   concurrent review for patient with diverticulitis, continued abd pain, low grade temp, iv abx     Action/Plan:   home when stable   Anticipated DC Date:  12/21/2013   Anticipated DC Plan:  [place]  CM consult      P[place] Choice  NA   Choice offered to / List presented to:  NA      DME agency  NA     [place] arranged  NA      [place] agency  NA   Status of service:  Completed, signed off Medicare Important Message given?  NA - LOS <3 / Initial given by admissions (If response is "NO", the following Medicare IM given date fields will be blank) Date Medicare IM given:   Medicare IM given by:   Date Additional Medicare IM given:   Additional Medicare IM given by:    Discharge Disposition:  HOME/SELF CARE  Per UR Regulation:  Reviewed for med. necessity/level of care/duration of stay  If discussed at [place] of Stay Meetings, dates discussed:   12/21/2013    Comments:  12/21/13 Faven Watterson RN,BSN NCM [phone] D/C HOME NO NEEDS OR ORDERS.  Rhonda Davis,RN,BSn,CCM:

## 2013-12-21 NOTE — Progress Notes (Signed)
Discharge instructions given to pt, verbalized understanding, left the unit in stable condition.

## 2013-12-21 NOTE — Discharge Summary (Signed)
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 Will discharge home. Patient to follow low sodium diet/low calorie diet and to follow portion control. Will follow with PCP in 10 days and will need follow up with GI service for outpatient colonoscopy and also follow up with bariatric surgeon.  Diet recommendation: low calorie, low sodium and low residue diet  Filed Weights   12/15/13 2057  Weight: 151.32 kg (333 lb 9.6 oz)    History of present illness:  48 y.o. 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 Has seen PCP 3 times, and had a CT scan done on 12/12/13 which showed mild acute diverticulitis of the proximal sigmoid colon. He subsequently was started on Cipro and flagyl but despite taking these antibiotics, reported no significant relief. Has had some associated nausea but no vomiting. No melena or hematochezia. No aggravating factors to abdominal pain, but pain medications just helping a little bit. No fever or chills. Last BM was on morning of admission. Pain was described as crampy and colicky,  sharp, 73/41 at worst.    Hospital Course:  Diverticulitis with elevated lipase  Slowly improving; still with some abd pain.  Follow up in outpatient setting with GI service for colonoscopy once acute episode resolved  Abdominal ultrasound done 12/18/13 secondary to new complaints of RUQ pain. Small amount of gallbladder sludge and hepatic enlargement noted; no signs of acute cholecystitis  Lipase now back to normal range  ABD x-ray 2 views on 8/26 w/o free air, obstructions or acute abnormalities Will discharge home to complete PO antibiotic regimen and will need outpatient follow up with GI for colonoscopy.  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  Asthma  Stable, no wheezing.  Continue albuterol and Advair.  Obesity/Fatty liver  LFTs okay.  Recommend weight loss and low fat diet X-ray demonstrating increase fluid and gas pattern on his stomach; concerns for distension with lap band.  Patient will follow with bariatric surgeon    Procedures: See below for x-ray reports   Consultations:  None   Discharge Exam: Filed Vitals:   12/21/13 1025  BP: 152/86  Pulse: 70  Temp:   Resp:    Gen: still with mild epigastric/mid abd pain, no vomiting. Still with intermittent episodes of nausea, but able to tolerate diet w/o problem. No fever. Cardiovascular: RRR, No M/R/G  Respiratory:  Lungs CTAB  Gastrointestinal: Abdomen soft, tender mid abd and epigastric area; no distension, no guarding; positive BS  Extremities: No Cyanosis or clubbing   Discharge Instructions You were cared for by a hospitalist during your hospital stay. If you have any questions  about your discharge medications or the care you received while you were in the hospital after you are discharged, you can call the unit and asked to speak with the hospitalist on call if the hospitalist that took care of you is not available. Once you are discharged, your primary care physician will handle any further medical issues. Please note that NO REFILLS for any discharge medications will be authorized once you are discharged, as it is imperative that you return to your primary care physician (or establish a relationship with a primary care physician if you do not have one) for your aftercare needs so that they can reassess your need for medications and monitor your lab values.  Discharge Instructions   Diet - low sodium heart healthy    Complete by:  As directed      Discharge instructions    Complete by:  As directed   Keep yourself well hydrated Take medications as prescribed Antibiotics will be continue for 8 more days (to complete a total of 14 days treatment) Arrange follow up with gastroenterology as an outpatient for colonoscopy Follow up visit with PCP in 10 days Follow up visit with Dr. Hassell Done (for follow up on bariatric lap band) Follow low calorie diet and portion control            Medication List    STOP taking these medications       ondansetron 4 MG tablet  Commonly known as:  ZOFRAN      TAKE these medications       albuterol 108 (90 BASE) MCG/ACT inhaler  Commonly known as:  PROVENTIL HFA;VENTOLIN HFA  Inhale 2 puffs into the lungs every 6 (six) hours as needed for wheezing or shortness of breath.     ciprofloxacin 500 MG tablet  Commonly known as:  CIPRO  Take 1 tablet (500 mg total) by mouth 2 (two) times daily.     Fluticasone-Salmeterol 250-50 MCG/DOSE Aepb  Commonly known as:  ADVAIR  Inhale 1 puff into the lungs every 12 (twelve) hours.     lidocaine 5 % ointment  Commonly known as:  XYLOCAINE  Apply to lateral thigh twice daily as needed.   Wear gloves when applying medication.     metroNIDAZOLE 500 MG tablet  Commonly known as:  FLAGYL  Take 1 tablet (500 mg total) by mouth 3 (three) times daily.     morphine 15 MG tablet  Commonly known as:  MSIR  Take 1-2 tablets (15-30 mg total) by mouth every 6 (six) hours as needed for moderate pain or severe pain.     ondansetron 8 MG disintegrating tablet  Commonly known as:  ZOFRAN ODT  Take 1 tablet (8 mg total) by mouth every 8 (eight) hours as needed for nausea or vomiting.     polyethylene glycol packet  Commonly known as:  MIRALAX / GLYCOLAX  Take 17 g by mouth daily.       Allergies  Allergen Reactions  . Penicillins Shortness Of Breath    Swelling of uvula  . Solu-Medrol [Methylprednisolone Acetate] Anaphylaxis  . Dilaudid [Hydromorphone Hcl] Nausea And Vomiting  . Oxycodone Nausea And Vomiting  . Percocet [Oxycodone-Acetaminophen] Nausea And Vomiting  Follow-up Information   Follow up with Pedro Earls, MD. (Dr. Earlie Server office will call you with an appt day/time. )    Specialty:  General Surgery   Contact information:   [place] [address] [phone]       Follow up with [name], MD In 10 days.   Specialty:  Family Medicine   Contact information:   [address] [place] [address] [phone]       The results of significant diagnostics from this hospitalization (including imaging, microbiology, ancillary and laboratory) are listed below for reference.    Significant Diagnostic Studies: Ct Abdomen Pelvis W Contrast  12/15/2013   CLINICAL DATA:  Abdominal pain; history of diverticulitis  EXAM: CT ABDOMEN AND PELVIS WITH CONTRAST  TECHNIQUE: Multidetector CT imaging of the abdomen and pelvis was performed using the standard protocol following bolus administration of intravenous contrast. Oral contrast was also administered.  CONTRAST:  147mL OMNIPAQUE IOHEXOL 300 MG/ML  SOLN   COMPARISON:  December 12, 2013  FINDINGS: Lung bases are clear.  Liver is prominent measuring 20.6 cm in length. There appears to be fatty change in the liver. No focal liver lesions are identified. Gallbladder wall is not thickened. There is no biliary duct dilatation.  There is a lap band at the gastric cardia.  Spleen, pancreas, and adrenals appear normal. Kidneys bilaterally show no evidence of mass or hydronephrosis. There is no renal or ureteral calculus on either side.  In the pelvis, there is slightly less inflammation involving the proximal sigmoid colon. There is some subtle mesenteric stranding and thickening of the wall in the proximal sigmoid colon. There is no microperforation or new inflammation. No abscess is appreciable in this area of diverticulitis.  There is no pelvic mass or fluid collection. Fat is noted in each inguinal ring. Urinary bladder is midline with normal wall thickness. Appendix appears normal.  There is no appreciable bowel obstruction. There is no free air or portal venous air.  There is no ascites, adenopathy, or abscess in the abdomen or pelvis. There is no abdominal aortic aneurysm. There are no blastic or lytic bone lesions.  IMPRESSION: Slightly less inflammatory change in the area of proximal sigmoid diverticulitis. No new inflammatory focus seen on this study. No abscess. Debris remains in the diverticulum which is inflamed in the proximal sigmoid colon.  Appendix appears normal.  No bowel obstruction. No renal or ureteral calculus. No hydronephrosis.  Liver is enlarged with fatty change. Lap band is present at the gastric cardia.   Electronically Signed   By: Lowella Grip M.D.   On: 12/15/2013 17:14   Ct Abdomen Pelvis W Contrast  12/12/2013   CLINICAL DATA:  Mid abdominal to epigastric pain  EXAM: CT ABDOMEN AND PELVIS WITH CONTRAST  TECHNIQUE: Multidetector CT imaging of the abdomen and pelvis was performed using the standard protocol following bolus administration  of intravenous contrast.  CONTRAST:  19mL OMNIPAQUE IOHEXOL 300 MG/ML SOLN, 159mL OMNIPAQUE IOHEXOL 300 MG/ML SOLN  COMPARISON:  CT 09/15/2013  FINDINGS: Lung bases are clear.  No pericardial fluid.  No focal hepatic lesion. Gallbladder, pancreas, spleen, adrenal glands, and kidneys are normal.  There is a gastric banding device in place. No complicating features are identified. The small bowel, appendix, and cecum normal. In the proximal sigmoid colon there is a small focus of pericolonic inflammation (image 67, series 2. This is at the stage site of previously seen mild diverticulitis. Findings  are slightly less severe than comparison exam. There is a large diverticulum at this level the seen better on comparison exam measuring approximately 2 cm. The distal rectosigmoid colon is normal.  Abdominal aorta is normal caliber. No retroperitoneal periportal lymphadenopathy.  No free fluid the pelvis. Prostate gland and bladder normal. There small bilateral fat filled inguinal hernias. No aggressive osseous lesion.  IMPRESSION: 1. Mild acute diverticulitis of the proximal sigmoid colon. There is a large diverticula this level which was previously inflamed. Findings are are less severe than and mild to moderate diverticulitis identified on CT of 09/13/2013 2. Normal appendix. 3. Gastric may device placed by complicating features.   Electronically Signed   By: Suzy Bouchard M.D.   On: 12/12/2013 18:26   US Abdomen Limited  12/18/2013   CLINICAL DATA:  Pain.  EXAM: US ABDOMEN LIMITED - RIGHT UPPER QUADRANT  COMPARISON:  CT 12/15/2013.  FINDINGS: Gallbladder:  Small amount of sludge. Gallbladder wall thickness normal. Negative Murphy sign. No pericholecystic fluid .  Common bile duct:  Diameter: 4.7 mm  Liver:  Liver is echogenic. This is consistent with fatty infiltration and/or hepatocellular disease. Hepatic enlargement. No focal abnormality.  IMPRESSION: 1.  Small amount of gallbladder sludge.  2.  Hepatic  enlargement.   Electronically Signed   By: Marcello Moores  Register   On: 12/18/2013 10:22   Dg Abd 2 Views  12/20/2013   CLINICAL DATA:  Worsening abdominal pain.  Diverticulitis.  EXAM: ABDOMEN - 2 VIEW  COMPARISON:  CT scan from 12/15/2013.  FINDINGS: Upright film shows no evidence for intraperitoneal free air. A lap band is visualized and appears appropriately oriented from 2:00 to 8:00 positions. Prominent amount of fluid is seen in the distended stomach and there is a prominent gastric bubble associated.  Supine film shows no gaseous small bowel dilatation to suggest obstruction. No unexpected abdominal pelvic calcification. The visualized bony structures are unremarkable.  IMPRESSION: No evidence for intraperitoneal free air or bowel obstruction.  Prominent amount of fluid and air in the stomach in this patient status post lap band placement.   Electronically Signed   By: Misty Stanley M.D.   On: 12/20/2013 16:28   Labs: Basic Metabolic Panel:  Recent Labs Lab 12/15/13 1506 12/16/13 0525 12/21/13 0510  NA 138 140 139  K 3.7 4.0 4.3  CL 102 103 103  CO2 23 26 24   GLUCOSE 125* 94 95  BUN 8 8 10   CREATININE 1.07 1.18 1.05  CALCIUM 9.0 9.0 9.4   Liver Function Tests:  Recent Labs Lab 12/15/13 1506  AST 20  ALT 20  ALKPHOS 72  BILITOT 0.3  PROT 7.4  ALBUMIN 3.5    Recent Labs Lab 12/15/13 1506 12/19/13 1342  LIPASE 61* 29   CBC:  Recent Labs Lab 12/15/13 1506 12/16/13 0525 12/21/13 0510  WBC 8.2 8.6 11.0*  NEUTROABS 4.8  --   --   HGB 13.5 13.1 13.1  HCT 41.4 40.4 40.9  MCV 84.1 84.5 85.0  PLT 327 305 309    Signed:  Barton Dubois  [facility] 12/21/2013, 12:33 PM

## 2013-12-22 ENCOUNTER — Telehealth (INDEPENDENT_AMBULATORY_CARE_PROVIDER_SITE_OTHER): Payer: Self-pay

## 2013-12-22 NOTE — Telephone Encounter (Signed)
Message copied by Ivor Costa on Fri Dec 22, 2013 10:54 AM ------      Message from: Francee Nodal      Created: Thu Dec 21, 2013 11:12 AM       Hospital called wanting the pt to be seen BY Dr Hassell Done in a week for Diverticulitis. I didn't see anything, explained that to the nurse. They were going to discharge him from hospital today      Thanks       Tonya   ------

## 2013-12-22 NOTE — Telephone Encounter (Signed)
Called and spoke to patient regarding his imaging from ED visit on 12/15/13.  Dr. Excell Seltzer viewed images and states lap band is in normal position.  Patient concerned that "it may burst" advised patient that I have scheduled him an appointment in our [facility] on Thursday 12/28/13.  Patient aware that I will have Dr. Hassell Done review imaging when he's available.  Patient verbalized understanding at this time and agrees with plan as above.

## 2013-12-28 ENCOUNTER — Encounter (INDEPENDENT_AMBULATORY_CARE_PROVIDER_SITE_OTHER): Payer: BC Managed Care – PPO

## 2014-01-02 ENCOUNTER — Ambulatory Visit: Payer: BC Managed Care – PPO | Admitting: Neurology

## 2014-03-02 ENCOUNTER — Ambulatory Visit: Payer: BC Managed Care – PPO | Admitting: Neurology

## 2014-03-30 ENCOUNTER — Ambulatory Visit (INDEPENDENT_AMBULATORY_CARE_PROVIDER_SITE_OTHER): Payer: BC Managed Care – PPO | Admitting: Neurology

## 2014-03-30 ENCOUNTER — Encounter: Payer: Self-pay | Admitting: Neurology

## 2014-03-30 DIAGNOSIS — M792 Neuralgia and neuritis, unspecified: Secondary | ICD-10-CM

## 2014-03-30 DIAGNOSIS — G5790 Unspecified mononeuropathy of unspecified lower limb: Secondary | ICD-10-CM

## 2014-03-30 DIAGNOSIS — G5712 Meralgia paresthetica, left lower limb: Secondary | ICD-10-CM

## 2014-03-30 DIAGNOSIS — G5711 Meralgia paresthetica, right lower limb: Secondary | ICD-10-CM

## 2014-03-30 MED ORDER — LIDOCAINE 4 % EX CREA: 1.0000 "application " | TOPICAL_CREAM | CUTANEOUS | Status: DC | PRN

## 2014-03-30 MED ORDER — DULOXETINE HCL 30 MG PO CPEP: 30.0000 mg | ORAL_CAPSULE | Freq: Every day | ORAL | Status: DC

## 2014-03-30 NOTE — Progress Notes (Signed)
Follow-up Visit   Date: 03/30/2014    Nathaniel Chambers MRN: [id number] DOB: [birth year]   Interim History: Nathaniel Chambers is a 48 y.o. 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      History of present illness: Since 2014, he started feeling an abnormality behind his thigh and was eventually found to have found to have a mass involving the posterior thigh. 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 Gwyndolyn Saxon Ward in [place]. A few days following surgery, he developed numbness involving the RIGHT anterolateral thigh. Symptoms have worsened over the past several months and now he has extreme sensitivity with sharp pain involving the left thigh. Light pressure, air, and even bed sheets causes severe pain. He tried on amitriptyline and Lyrica which did not help pain, but made him sleepy. He was taking ibuprofen 600mg  following surgery for a brief time, but since stopping it, he feels that pain may have worsened. He also reports having sharp pain involving the left thigh around mid-May.   Prior to surgery, he had no numbness/tingling of the thighs. He has gained 30lb following surgery.   UPDATE 03/30/2014:  Since starting gabapentin 600mg  qhs, he noticed mild improvement of pain but he reports getting very lightheaded with it so is only taking it at night.  From an oncology stand-point, he was recently re-evaluated and there has been no reoccurrence.  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  He is staying very active and trying to loose weight.  Aspercream helps more than lidocaine ointment.   Medications:  Current Outpatient Prescriptions on File Prior to Visit  Medication Sig Dispense Refill  .  albuterol (PROVENTIL HFA;VENTOLIN HFA) 108 (90 BASE) MCG/ACT inhaler Inhale 2 puffs into the lungs every 6 (six) hours as needed for wheezing or shortness of breath.     . Fluticasone-Salmeterol (ADVAIR) 250-50 MCG/DOSE AEPB Inhale 1 puff into the lungs every 12 (twelve) hours.    . lidocaine (XYLOCAINE) 5 % ointment Apply to lateral thigh twice daily as needed.  Wear gloves when applying medication. 50 g 3  . morphine (MSIR) 15 MG tablet Take 1-2 tablets (15-30 mg total) by mouth every 6 (six) hours as needed for moderate pain or severe pain. 30 tablet 0  . ondansetron (ZOFRAN ODT) 8 MG disintegrating tablet Take 1 tablet (8 mg total) by mouth every 8 (eight) hours as needed for nausea or vomiting. 20 tablet 0  . polyethylene glycol (MIRALAX / GLYCOLAX) packet Take 17 g by mouth daily. 14 each 0   No current facility-administered medications on file prior to visit.    Allergies:  Allergies  Allergen Reactions  . Penicillins Shortness Of Breath    Swelling of uvula  . Solu-Medrol [Methylprednisolone Acetate] Anaphylaxis  . Dilaudid [Hydromorphone Hcl] Nausea And Vomiting  . Oxycodone Nausea And Vomiting  . Percocet [Oxycodone-Acetaminophen] Nausea And Vomiting    Review of Systems:  CONSTITUTIONAL: No fevers, chills, night sweats, or weight loss.  EYES: No visual changes or eye pain ENT: No hearing changes.  No history of nose bleeds.   RESPIRATORY: No cough, wheezing and shortness of breath.   CARDIOVASCULAR: Negative for chest pain, and palpitations.   GI: Negative for abdominal discomfort, blood in stools or  black stools.  No recent change in bowel habits.   GU:  No history of incontinence.   MUSCLOSKELETAL: No history of joint pain or swelling.  No myalgias.   SKIN: Negative for lesions, rash, and itching.   ENDOCRINE: Negative for cold or heat intolerance, polydipsia or goiter.   PSYCH:  No depression or anxiety symptoms.   NEURO: As Above.   Vital Signs:  BP 150/80 mmHg   Pulse 81  Ht 6\' 2"  (1.88 m)  Wt 335 lb (151.955 kg)  BMI 42.99 kg/m2  SpO2 97%  Neurological Exam: MENTAL STATUS including orientation to time, place, and person is normal.  Speech is not dysarthric.  CRANIAL NERVES: Pupils equal round and reactive to light.    Face is symmetric.   MOTOR:  Motor strength is 5/5 in all extremities.  No pronator drift.  Tone is normal.    MSRs:  Reflexes are 2+/4 throughout, except 1+ Achilles bilaterally.  SENSORY: Diminished sensation to temperature and light touch over the anterolateral thigh (R >L), .  COORDINATION/GAIT:    Gait narrow based and stable.   Data: CT abdomen pelvis with contrast 09/15/2013: Acute sigmoid diverticulitis.  No drainable fluid collection/abscess. No free air.  MRA head 10/31/2008: 1. Possible moderate 50% stenosis involving the right  vertebrobasilar junction.  2. No occlusions, dissections or aneurysms seen on the study.  MRI brain wo contrast 10/31/2008: 1. No evidence of acute ischemia.  2. Abnormal marrow signal involving the upper one third of the clivus. Differential considerations include marrow changes related  to anemia, smoking, or a neoplastic infiltrative process. Clinical correlation and further evaluation with a dedicated CT scan through the cranial skull base is suggested.  3. Moderate inflammatory thickening of the mucosa in the ethmoids and the maxillary sinuses.   IMPRESSION/PLAN: 1.  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  Morbid obesity  - Praised him for staying active and encouraged him to continue going to the gym as well as eating healthy  - Because of his diverticulosis, he may benefit from dietician consult as he is eager to loose weight, but asked  him to discuss this with his PCP or gastroenterologist  3.  Telephone update in 1 month, return to clinic in 80-months   The duration of this appointment visit was 25 minutes of face-to-face time with the patient.  Greater than 50% of this time was spent in counseling, explanation of diagnosis, planning of further management, and coordination of care.   Thank you for allowing me to participate in patient's care.  If I can answer any additional questions, I would be pleased to do so.    Sincerely,    Arless Vineyard K. Posey Pronto, DO

## 2014-04-03 ENCOUNTER — Ambulatory Visit
Admission: RE | Admit: 2014-04-03 | Discharge: 2014-04-03 | Disposition: A | Discharge status: Home / Self Care | Payer: BC Managed Care – PPO | Source: Ambulatory Visit | Attending: Gastroenterology | Admitting: Gastroenterology

## 2014-04-03 ENCOUNTER — Inpatient Hospital Stay (HOSPITAL_COMMUNITY)
Admission: EM | Admit: 2014-04-03 | Discharge: 2014-04-10 | DRG: 392 | Disposition: A | Discharge status: Home / Self Care | Payer: BC Managed Care – PPO | Attending: Internal Medicine | Admitting: Internal Medicine

## 2014-04-03 ENCOUNTER — Other Ambulatory Visit: Payer: Self-pay | Admitting: Gastroenterology

## 2014-04-03 ENCOUNTER — Encounter (HOSPITAL_COMMUNITY): Payer: Self-pay | Admitting: Emergency Medicine

## 2014-04-03 DIAGNOSIS — Z9884 Bariatric surgery status: Secondary | ICD-10-CM

## 2014-04-03 DIAGNOSIS — Z6841 Body Mass Index (BMI) 40.0 and over, adult: Secondary | ICD-10-CM

## 2014-04-03 DIAGNOSIS — K5720 Diverticulitis of large intestine with perforation and abscess without bleeding: Secondary | ICD-10-CM | POA: Diagnosis present

## 2014-04-03 DIAGNOSIS — Z880 Allergy status to penicillin: Secondary | ICD-10-CM | POA: Diagnosis not present

## 2014-04-03 DIAGNOSIS — Z7951 Long term (current) use of inhaled steroids: Secondary | ICD-10-CM | POA: Diagnosis not present

## 2014-04-03 DIAGNOSIS — Z8719 Personal history of other diseases of the digestive system: Secondary | ICD-10-CM

## 2014-04-03 DIAGNOSIS — K2970 Gastritis, unspecified, without bleeding: Secondary | ICD-10-CM | POA: Diagnosis present

## 2014-04-03 DIAGNOSIS — Z713 Dietary counseling and surveillance: Secondary | ICD-10-CM | POA: Diagnosis present

## 2014-04-03 DIAGNOSIS — Z8701 Personal history of pneumonia (recurrent): Secondary | ICD-10-CM | POA: Diagnosis not present

## 2014-04-03 DIAGNOSIS — Z888 Allergy status to other drugs, medicaments and biological substances status: Secondary | ICD-10-CM | POA: Diagnosis not present

## 2014-04-03 DIAGNOSIS — K5732 Diverticulitis of large intestine without perforation or abscess without bleeding: Secondary | ICD-10-CM

## 2014-04-03 DIAGNOSIS — J45909 Unspecified asthma, uncomplicated: Secondary | ICD-10-CM | POA: Diagnosis present

## 2014-04-03 DIAGNOSIS — Z87891 Personal history of nicotine dependence: Secondary | ICD-10-CM

## 2014-04-03 DIAGNOSIS — K5792 Diverticulitis of intestine, part unspecified, without perforation or abscess without bleeding: Secondary | ICD-10-CM

## 2014-04-03 DIAGNOSIS — R109 Unspecified abdominal pain: Secondary | ICD-10-CM | POA: Diagnosis present

## 2014-04-03 DIAGNOSIS — K5780 Diverticulitis of intestine, part unspecified, with perforation and abscess without bleeding: Secondary | ICD-10-CM

## 2014-04-03 DIAGNOSIS — E669 Obesity, unspecified: Secondary | ICD-10-CM | POA: Diagnosis present

## 2014-04-03 DIAGNOSIS — M728 Other fibroblastic disorders: Secondary | ICD-10-CM | POA: Diagnosis present

## 2014-04-03 DIAGNOSIS — R1084 Generalized abdominal pain: Secondary | ICD-10-CM

## 2014-04-03 DIAGNOSIS — K760 Fatty (change of) liver, not elsewhere classified: Secondary | ICD-10-CM | POA: Diagnosis present

## 2014-04-03 DIAGNOSIS — Z9852 Vasectomy status: Secondary | ICD-10-CM

## 2014-04-03 DIAGNOSIS — J4520 Mild intermittent asthma, uncomplicated: Secondary | ICD-10-CM

## 2014-04-03 LAB — COMPREHENSIVE METABOLIC PANEL
ALK PHOS: 84 U/L (ref 39–117)
ALT: 15 U/L (ref 0–53)
AST: 19 U/L (ref 0–37)
Albumin: 3.9 g/dL (ref 3.5–5.2)
Anion gap: 15 (ref 5–15)
BILIRUBIN TOTAL: 0.3 mg/dL (ref 0.3–1.2)
BUN: 7 mg/dL (ref 6–23)
CO2: 24 meq/L (ref 19–32)
CREATININE: 1.12 mg/dL (ref 0.50–1.35)
Calcium: 9.8 mg/dL (ref 8.4–10.5)
Chloride: 101 mEq/L (ref 96–112)
GFR calc Af Amer: 88 mL/min — ABNORMAL LOW (ref >90)
GFR, EST NON AFRICAN AMERICAN: 76 mL/min — AB (ref >90)
GLUCOSE: 83 mg/dL (ref 70–99)
POTASSIUM: 4.3 meq/L (ref 3.7–5.3)
Sodium: 140 mEq/L (ref 137–147)
Total Protein: 7.9 g/dL (ref 6.0–8.3)

## 2014-04-03 LAB — CBC WITH DIFFERENTIAL/PLATELET
BASOS ABS: 0 10*3/uL (ref 0.0–0.1)
Basophils Relative: 0 % (ref 0–1)
Eosinophils Absolute: 0.2 10*3/uL (ref 0.0–0.7)
Eosinophils Relative: 1 % (ref 0–5)
HCT: 42.3 % (ref 39.0–52.0)
Hemoglobin: 13.4 g/dL (ref 13.0–17.0)
Lymphocytes Relative: 34 % (ref 12–46)
Lymphs Abs: 3.6 10*3/uL (ref 0.7–4.0)
MCH: 27.2 pg (ref 26.0–34.0)
MCHC: 31.7 g/dL (ref 30.0–36.0)
MCV: 86 fL (ref 78.0–100.0)
Monocytes Absolute: 0.6 10*3/uL (ref 0.1–1.0)
Monocytes Relative: 6 % (ref 3–12)
NEUTROS ABS: 6.1 10*3/uL (ref 1.7–7.7)
Neutrophils Relative %: 59 % (ref 43–77)
PLATELETS: 338 10*3/uL (ref 150–400)
RBC: 4.92 MIL/uL (ref 4.22–5.81)
RDW: 14.1 % (ref 11.5–15.5)
WBC: 10.5 10*3/uL (ref 4.0–10.5)

## 2014-04-03 LAB — I-STAT TROPONIN, ED: Troponin i, poc: 0 ng/mL (ref 0.00–0.08)

## 2014-04-03 LAB — URINE MICROSCOPIC-ADD ON

## 2014-04-03 LAB — URINALYSIS, ROUTINE W REFLEX MICROSCOPIC
Bilirubin Urine: NEGATIVE
GLUCOSE, UA: NEGATIVE mg/dL
KETONES UR: NEGATIVE mg/dL
LEUKOCYTES UA: NEGATIVE
Nitrite: NEGATIVE
Protein, ur: NEGATIVE mg/dL
Specific Gravity, Urine: 1.046 — ABNORMAL HIGH (ref 1.005–1.030)
Urobilinogen, UA: 0.2 mg/dL (ref 0.0–1.0)
pH: 5.5 (ref 5.0–8.0)

## 2014-04-03 LAB — LIPASE, BLOOD: Lipase: 36 U/L (ref 11–59)

## 2014-04-03 MED ORDER — ALBUTEROL SULFATE (2.5 MG/3ML) 0.083% IN NEBU
2.5000 mg | INHALATION_SOLUTION | Freq: Four times a day (QID) | RESPIRATORY_TRACT | Status: DC | PRN
Start: 2014-04-03 — End: 2014-04-10

## 2014-04-03 MED ORDER — METRONIDAZOLE IN NACL 5-0.79 MG/ML-% IV SOLN
500.0000 mg | Freq: Three times a day (TID) | INTRAVENOUS | Status: DC
  Administered 2014-04-03 – 2014-04-09 (×17): 500 mg via INTRAVENOUS
  Filled 2014-04-03 (×17): qty 100

## 2014-04-03 MED ORDER — MORPHINE SULFATE 4 MG/ML IJ SOLN
6.0000 mg | Freq: Once | INTRAMUSCULAR | Status: AC
Start: 2014-04-03 — End: 2014-04-03
  Administered 2014-04-03: 6 mg via INTRAVENOUS
  Filled 2014-04-03: qty 2

## 2014-04-03 MED ORDER — ALBUTEROL SULFATE HFA 108 (90 BASE) MCG/ACT IN AERS: 2.0000 | INHALATION_SPRAY | Freq: Four times a day (QID) | RESPIRATORY_TRACT | Status: DC | PRN

## 2014-04-03 MED ORDER — SODIUM CHLORIDE 0.9 % IV SOLN
INTRAVENOUS | Status: DC
  Administered 2014-04-03 – 2014-04-07 (×4): via INTRAVENOUS

## 2014-04-03 MED ORDER — ONDANSETRON 8 MG PO TBDP
8.0000 mg | ORAL_TABLET | Freq: Three times a day (TID) | ORAL | Status: DC | PRN
Start: 2014-04-03 — End: 2014-04-04
  Administered 2014-04-03: 8 mg via ORAL
  Filled 2014-04-03 (×3): qty 1

## 2014-04-03 MED ORDER — MORPHINE SULFATE 15 MG PO TABS
15.0000 mg | ORAL_TABLET | Freq: Four times a day (QID) | ORAL | Status: DC | PRN
  Administered 2014-04-03 – 2014-04-08 (×4): 30 mg via ORAL
  Filled 2014-04-03 (×5): qty 2

## 2014-04-03 MED ORDER — DULOXETINE HCL 30 MG PO CPEP: 30.0000 mg | ORAL_CAPSULE | Freq: Every day | ORAL | Status: DC

## 2014-04-03 MED ORDER — CIPROFLOXACIN IN D5W 400 MG/200ML IV SOLN
400.0000 mg | Freq: Once | INTRAVENOUS | Status: AC
  Administered 2014-04-03: 400 mg via INTRAVENOUS
  Filled 2014-04-03: qty 200

## 2014-04-03 MED ORDER — CIPROFLOXACIN IN D5W 400 MG/200ML IV SOLN
400.0000 mg | Freq: Two times a day (BID) | INTRAVENOUS | Status: DC
  Administered 2014-04-04 – 2014-04-09 (×11): 400 mg via INTRAVENOUS
  Filled 2014-04-03 (×11): qty 200

## 2014-04-03 MED ORDER — POLYETHYLENE GLYCOL 3350 17 G PO PACK
17.0000 g | PACK | Freq: Every day | ORAL | Status: DC
  Administered 2014-04-03 – 2014-04-07 (×5): 17 g via ORAL
  Filled 2014-04-03 (×5): qty 1

## 2014-04-03 MED ORDER — METRONIDAZOLE IN NACL 5-0.79 MG/ML-% IV SOLN
500.0000 mg | Freq: Once | INTRAVENOUS | Status: DC
  Filled 2014-04-03: qty 100

## 2014-04-03 MED ORDER — FOLIC ACID 1 MG PO TABS
1.0000 mg | ORAL_TABLET | Freq: Every day | ORAL | Status: DC
  Administered 2014-04-03 – 2014-04-10 (×8): 1 mg via ORAL
  Filled 2014-04-03 (×8): qty 1

## 2014-04-03 MED ORDER — MONTELUKAST SODIUM 10 MG PO TABS
10.0000 mg | ORAL_TABLET | Freq: Every day | ORAL | Status: DC
  Administered 2014-04-03 – 2014-04-09 (×7): 10 mg via ORAL
  Filled 2014-04-03 (×8): qty 1

## 2014-04-03 MED ORDER — FLUTICASONE-SALMETEROL 250-50 MCG/DOSE IN AEPB
1.0000 | INHALATION_SPRAY | Freq: Two times a day (BID) | RESPIRATORY_TRACT | Status: DC
  Administered 2014-04-04 – 2014-04-10 (×3): 1 via RESPIRATORY_TRACT
  Filled 2014-04-03: qty 14

## 2014-04-03 MED ORDER — VITAMIN B-1 100 MG PO TABS
100.0000 mg | ORAL_TABLET | Freq: Every day | ORAL | Status: DC
  Administered 2014-04-03 – 2014-04-10 (×8): 100 mg via ORAL
  Filled 2014-04-03 (×8): qty 1

## 2014-04-03 MED ORDER — IOHEXOL 300 MG/ML  SOLN: 125.0000 mL | Freq: Once | INTRAMUSCULAR | Status: AC | PRN

## 2014-04-03 MED ORDER — MOMETASONE FURO-FORMOTEROL FUM 100-5 MCG/ACT IN AERO: 2.0000 | INHALATION_SPRAY | Freq: Two times a day (BID) | RESPIRATORY_TRACT | Status: DC

## 2014-04-03 MED ORDER — HEPARIN SODIUM (PORCINE) 5000 UNIT/ML IJ SOLN
5000.0000 [IU] | Freq: Three times a day (TID) | INTRAMUSCULAR | Status: DC
  Administered 2014-04-03 – 2014-04-10 (×19): 5000 [IU] via SUBCUTANEOUS
  Filled 2014-04-03 (×23): qty 1

## 2014-04-03 MED ORDER — ADULT MULTIVITAMIN W/MINERALS CH
1.0000 | ORAL_TABLET | Freq: Every day | ORAL | Status: DC
  Administered 2014-04-03 – 2014-04-10 (×8): 1 via ORAL
  Filled 2014-04-03 (×8): qty 1

## 2014-04-03 MED ORDER — ONDANSETRON HCL 4 MG/2ML IJ SOLN
4.0000 mg | Freq: Once | INTRAMUSCULAR | Status: AC
  Administered 2014-04-03: 4 mg via INTRAVENOUS
  Filled 2014-04-03: qty 2

## 2014-04-03 NOTE — Progress Notes (Signed)
ANTIBIOTIC CONSULT NOTE - INITIAL  Pharmacy Consult for Cipro Indication: intra-abdominal infection  Allergies  Allergen Reactions  . Penicillins Shortness Of Breath    Swelling of uvula  . Solu-Medrol [Methylprednisolone Acetate] Anaphylaxis  . Dilaudid [Hydromorphone Hcl] Nausea And Vomiting  . Oxycodone Nausea And Vomiting  . Percocet [Oxycodone-Acetaminophen] Nausea And Vomiting    Patient Measurements:   Last weight 152 kg (03/30/14)   Vital Signs: Temp: 97.6 F (36.4 C) (12/08 1643) Temp Source: Oral (12/08 1643) BP: 152/93 mmHg (12/08 1643) Pulse Rate: 73 (12/08 1643) Intake/Output from previous day:   Intake/Output from this shift:    Labs:  Recent Labs  04/03/14 1646  WBC 10.5  HGB 13.4  PLT 338  CREATININE 1.12   Estimated Creatinine Clearance: 125.6 mL/min (by C-G formula based on Cr of 1.12).  Microbiology: No results found for this or any previous visit (from the past 720 hour(s)).  Medical History: Past Medical History  Diagnosis Date  . Asthma   . Pneumonia     history of  . 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 Leonides Schanz  . Meralgia paraesthetica March 2015    Bilaterally  . Diverticulitis      Assessment: 68 yoM presenting [facility] ED on 12/8 with abdominal pain x2 wks and PMH significant for diverticulitis. CT shows persistent diverticulitis and abscess. He was given cipro and metronidazole x1 in ED. Pharmacy is consulted to dose Cipro.  12/8 >> cipro >> 12/8 >> metronidazole >>   Today, 04/03/2014   Tmax: 97.6  WBCs: 10.5  Renal: SCr 1.12, CrCl > 100 ml/min   Goal of Therapy:  Appropriate abx dosing, eradication of infection.   Plan:   Cipro 400mg  IV q12h  Pharmacy to s/o note witting, but will f/u peripherally for renal function, cultures, and clinical condition as available.   Gretta Arab PharmD, BCPS Pager [phone] 04/03/2014 6:59 PM

## 2014-04-03 NOTE — ED Provider Notes (Signed)
CSN: [id number]     Arrival date & time 04/03/14  1603 History   First MD Initiated Contact with Patient 04/03/14 1649     Chief Complaint  Patient presents with  . Abdominal Pain     (Consider location/radiation/quality/duration/timing/severity/associated sxs/prior Treatment) Patient is a 48 y.o. male presenting with abdominal pain. The history is provided by the patient (pt complains of abd pain for 2 weeks.   pt has a hx of diverticulitis).  Abdominal Pain Pain location:  LLQ Pain quality: aching   Pain radiates to:  Does not radiate Pain severity:  Moderate Onset quality:  Gradual Timing:  Constant Progression:  Worsening Chronicity:  Recurrent Context: not alcohol use   Associated symptoms: no chest pain, no cough, no diarrhea, no fatigue and no hematuria     Past Medical History  Diagnosis Date  . Asthma   . Pneumonia     history of  . 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 Leonides Schanz  . Meralgia paraesthetica March 2015    Bilaterally  . Diverticulitis    Past Surgical History  Procedure Laterality Date  . Laparoscopic gastric banding  04-26-2007  . Repair recurrent right inguinal hernia  10-22-2008  . Surg. for undescended testicles/ bilateral inguinal hernia repair  AGE 74 MON OLD  . Left shoulder surg  2000    rotator cuff  . Hernia repair  08/2008    rt inguinal  . Shoulder arthroscopy  01/2009    rt   . Shoulder arthroscopy  07/01/2011    Procedure: ARTHROSCOPY SHOULDER;  Surgeon: Magnus Sinning, MD;  Location: [facility];  Service: Orthopedics;  Laterality: Left;  WITH LABRAL DEBRIDEMENT  . Shoulder open rotator cuff repair  07/01/2011    Procedure: ROTATOR CUFF REPAIR SHOULDER OPEN;  Surgeon: Magnus Sinning, MD;  Location: [facility];  Service: Orthopedics;  Laterality: Left;  OPEN DISTAL CLAVICLE REPAIR  . Vasectomy    . Leg surgery      cancer   Family History  Problem Relation Age of Onset  . Heart disease  Father   . Cancer Mother     kidney, spleen   History  Substance Use Topics  . Smoking status: Former Smoker -- 0.25 packs/day for 1 years    Quit date: 06/25/1988  . Smokeless tobacco: Former Systems developer     Comment: smoked socially  . Alcohol Use: No    Review of Systems  Constitutional: Negative for appetite change and fatigue.  HENT: Negative for congestion, ear discharge and sinus pressure.   Eyes: Negative for discharge.  Respiratory: Negative for cough.   Cardiovascular: Negative for chest pain.  Gastrointestinal: Positive for abdominal pain. Negative for diarrhea.  Genitourinary: Negative for frequency and hematuria.  Musculoskeletal: Negative for back pain.  Skin: Negative for rash.  Neurological: Negative for seizures and headaches.  Psychiatric/Behavioral: Negative for hallucinations.      Allergies  Penicillins; Solu-medrol; Dilaudid; Oxycodone; and Percocet  Home Medications   Prior to Admission medications   Medication Sig Start Date End Date Taking? Authorizing Provider  albuterol (PROVENTIL HFA;VENTOLIN HFA) 108 (90 BASE) MCG/ACT inhaler Inhale 2 puffs into the lungs every 6 (six) hours as needed for wheezing or shortness of breath.     Historical Provider, MD  albuterol (PROVENTIL) (2.5 MG/3ML) 0.083% nebulizer solution 2.5 mg.    Historical Provider, MD  albuterol (VENTOLIN HFA) 108 (90 BASE) MCG/ACT inhaler Inhale 2 puffs into the lungs.  Historical Provider, MD  cephALEXin (KEFLEX) 500 MG capsule Take 500 mg by mouth 2 (two) times daily. 02/05/14   Historical Provider, MD  Cholecalciferol (VITAMIN D) 2000 UNITS CAPS Take 1 capsule by mouth.    Historical Provider, MD  clotrimazole-betamethasone (LOTRISONE) cream  02/06/14   Historical Provider, MD  DULoxetine (CYMBALTA) 30 MG capsule Take 1 capsule (30 mg total) by mouth daily. 03/30/14   Donika K Patel, DO  Fluticasone-Salmeterol (ADVAIR DISKUS) 250-50 MCG/DOSE AEPB Inhale 1 puff into the lungs.    Historical  Provider, MD  Fluticasone-Salmeterol (ADVAIR) 250-50 MCG/DOSE AEPB Inhale 1 puff into the lungs every 12 (twelve) hours.    Historical Provider, MD  lidocaine (ASPERCREME W/LIDOCAINE) 4 % cream Apply 1 application topically as needed. 03/30/14   Donika K Patel, DO  montelukast (SINGULAIR) 10 MG tablet Take 10 mg by mouth.    Historical Provider, MD  morphine (MSIR) 15 MG tablet Take 1-2 tablets (15-30 mg total) by mouth every 6 (six) hours as needed for moderate pain or severe pain. 12/21/13   Barton Dubois, MD  ondansetron (ZOFRAN ODT) 8 MG disintegrating tablet Take 1 tablet (8 mg total) by mouth every 8 (eight) hours as needed for nausea or vomiting. 12/21/13   Barton Dubois, MD  polyethylene glycol (MIRALAX / GLYCOLAX) packet Take 17 g by mouth daily. 12/21/13   Barton Dubois, MD  tadalafil (CIALIS) 10 MG tablet  06/26/13   Historical Provider, MD  terbinafine (LAMISIL) 250 MG tablet  01/22/14   Historical Provider, MD  Vitamin D, Ergocalciferol, (DRISDOL) 50000 UNITS CAPS capsule  06/28/13   Historical Provider, MD   BP 152/93 mmHg  Pulse 73  Temp(Src) 97.6 F (36.4 C) (Oral)  Resp 20  SpO2 97% Physical Exam  Constitutional: He is oriented to person, place, and time. He appears well-developed.  HENT:  Head: Normocephalic.  Eyes: Conjunctivae and EOM are normal. No scleral icterus.  Neck: Neck supple. No thyromegaly present.  Cardiovascular: Normal rate and regular rhythm.  Exam reveals no gallop and no friction rub.   No murmur heard. Pulmonary/Chest: No stridor. He has no wheezes. He has no rales. He exhibits no tenderness.  Abdominal: He exhibits no distension. There is tenderness. There is no rebound.  Tender llq  Musculoskeletal: Normal range of motion. He exhibits no edema.  Lymphadenopathy:    He has no cervical adenopathy.  Neurological: He is oriented to person, place, and time. He exhibits normal muscle tone. Coordination normal.  Skin: No rash noted. No erythema.  Psychiatric:  He has a normal mood and affect. His behavior is normal.    ED Course  Procedures (including critical care time) Labs Review Labs Reviewed  COMPREHENSIVE METABOLIC PANEL - Abnormal; Notable for the following:    GFR calc non Af Amer 76 (*)    GFR calc Af Amer 88 (*)    All other components within normal limits  CBC WITH DIFFERENTIAL  LIPASE, BLOOD  URINALYSIS, ROUTINE W REFLEX MICROSCOPIC  I-STAT TROPOININ, ED    Imaging Review Ct Abdomen Pelvis W Contrast  04/03/2014   CLINICAL DATA:  Worsening generalized abdominal pain for 4 weeks. Sigmoid diverticulitis. Gastric lap band.  EXAM: CT ABDOMEN AND PELVIS WITH CONTRAST  TECHNIQUE: Multidetector CT imaging of the abdomen and pelvis was performed using the standard protocol following bolus administration of intravenous contrast.  CONTRAST:  100 mL Omnipaque 300  COMPARISON:  12/15/2013  FINDINGS: Lower Chest:  Unremarkable.  Hepatobiliary: No masses or other significant  abnormality identified. Gallbladder unremarkable.  Pancreas: No mass, inflammatory changes, or other parenchymal abnormality identified.  Spleen:  Within normal limits in size and appearance.  Adrenal Glands:  No mass identified.  Kidneys/Urinary Tract: No masses identified. No evidence of hydronephrosis.  Stomach/Bowel/Peritoneum: Gastric lap band remains in appropriate position. No evidence of bowel obstruction. Mild sigmoid diverticulitis shows no significant change compared to previous study. There is a more well-defined gas and fluid collection in the adjacent sigmoid mesocolon which measures 2 x 3 cm on image 69 of series 2. No other abscess or free fluid identified. Normal appendix visualized.  Vascular/Lymphatic: No pathologically enlarged lymph nodes identified. No other significant abnormality identified.  Reproductive:  No mass or other significant abnormality identified.  Other: Small bilateral inguinal hernias containing only fat again demonstrated.  Musculoskeletal:  No  suspicious bone lesions identified.  IMPRESSION: Persistent mild sigmoid diverticulitis, with more well-defined 2 x 3 cm diverticular abscess now seen in adjacent sigmoid mesocolon. No other complication identified.   Electronically Signed   By: Earle Gell M.D.   On: 04/03/2014 16:02     EKG Interpretation None      MDM   Final diagnoses:  Acute diverticulitis    Admit diverticulits    Maudry Diego, MD 04/03/14 [phone]

## 2014-04-03 NOTE — ED Notes (Signed)
Pt c/o central abdominal pain starting 2.5 weeks ago.

## 2014-04-03 NOTE — ED Notes (Signed)
Patient aware of sample needed. Urinal at bedside. 

## 2014-04-03 NOTE — ED Notes (Signed)
Hospitalist MD at bedside. 

## 2014-04-03 NOTE — H&P (Signed)
[facility] History and Physical  CORAN DIPAOLA FAO:[id number] DOB: Oct 18, 1965 DOA: 04/03/2014  Referring physician: Milton Ferguson, MD PCP: [name], MD   Chief Complaint: Abdominal Pain  HPI: Nathaniel Chambers is a 48 y.o. male presents with abdominal pain. Patient had an episode of pain in August and was told that he had diverticulitis. Patient was worked up and treated as an inpatient it appears and seen by GI. He staets that he did have a colonoscopy done as an outpatient. Patient had noted diverticular disease. This time he states that he has been having lower abdominal pain for at least 2 weeks. He has no diarrhea noted no blood noted. Patient states that he has had no fevers noted. Patient has no vomiting noted. Pain is mainly in the LLQ. Patient states that the pain is not radiating.   Review of Systems:  Constitutional:  No weight loss, night sweats, Fevers, chills, fatigue.  HEENT:  No headaches, Difficulty swallowing Cardio-vascular:  No chest pain, Orthopnea, PND, swelling in lower extremities  GI:  No heartburn, indigestion, ++abdominal pain, ++nausea, no vomiting, diarrhea  Resp:  No shortness of breath with exertion or at rest. No excess mucus, no productive cough No coughing up of blood  Skin:  no rash or lesions GU:  no dysuria, change in color of urine, no urgency or frequency.  Musculoskeletal:  No joint pain or swelling. No decreased range of motion.  Psych:  No change in mood or affect. No depression or anxiety   Past Medical History  Diagnosis Date  . Asthma   . Pneumonia     history of  . 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 Leonides Schanz  . Meralgia paraesthetica March 2015    Bilaterally  . Diverticulitis    Past Surgical History  Procedure Laterality Date  . Laparoscopic gastric banding  04-26-2007  . Repair recurrent right inguinal hernia  10-22-2008  . Surg. for undescended testicles/ bilateral inguinal hernia repair  AGE 35 MON  OLD  . Left shoulder surg  2000    rotator cuff  . Hernia repair  08/2008    rt inguinal  . Shoulder arthroscopy  01/2009    rt   . Shoulder arthroscopy  07/01/2011    Procedure: ARTHROSCOPY SHOULDER;  Surgeon: Magnus Sinning, MD;  Location: [facility];  Service: Orthopedics;  Laterality: Left;  WITH LABRAL DEBRIDEMENT  . Shoulder open rotator cuff repair  07/01/2011    Procedure: ROTATOR CUFF REPAIR SHOULDER OPEN;  Surgeon: Magnus Sinning, MD;  Location: [facility];  Service: Orthopedics;  Laterality: Left;  OPEN DISTAL CLAVICLE REPAIR  . Vasectomy    . Leg surgery      cancer   Social History:  reports that he quit smoking about 25 years ago. He has quit using smokeless tobacco. He reports that he does not drink alcohol or use illicit drugs.  Allergies  Allergen Reactions  . Penicillins Shortness Of Breath    Swelling of uvula  . Solu-Medrol [Methylprednisolone Acetate] Anaphylaxis  . Dilaudid [Hydromorphone Hcl] Nausea And Vomiting  . Oxycodone Nausea And Vomiting  . Percocet [Oxycodone-Acetaminophen] Nausea And Vomiting    Family History  Problem Relation Age of Onset  . Heart disease Father   . Cancer Mother     kidney, spleen     Prior to Admission medications   Medication Sig Start Date End Date Taking? Authorizing Provider  albuterol (PROVENTIL HFA;VENTOLIN HFA) 108 (90  BASE) MCG/ACT inhaler Inhale 2 puffs into the lungs every 6 (six) hours as needed for wheezing or shortness of breath.     Historical Provider, MD  albuterol (PROVENTIL) (2.5 MG/3ML) 0.083% nebulizer solution 2.5 mg.    Historical Provider, MD  albuterol (VENTOLIN HFA) 108 (90 BASE) MCG/ACT inhaler Inhale 2 puffs into the lungs.    Historical Provider, MD  cephALEXin (KEFLEX) 500 MG capsule Take 500 mg by mouth 2 (two) times daily. 02/05/14   Historical Provider, MD  Cholecalciferol (VITAMIN D) 2000 UNITS CAPS Take 1 capsule by mouth.    Historical Provider, MD    clotrimazole-betamethasone (LOTRISONE) cream  02/06/14   Historical Provider, MD  DULoxetine (CYMBALTA) 30 MG capsule Take 1 capsule (30 mg total) by mouth daily. 03/30/14   Donika K Patel, DO  Fluticasone-Salmeterol (ADVAIR DISKUS) 250-50 MCG/DOSE AEPB Inhale 1 puff into the lungs.    Historical Provider, MD  Fluticasone-Salmeterol (ADVAIR) 250-50 MCG/DOSE AEPB Inhale 1 puff into the lungs every 12 (twelve) hours.    Historical Provider, MD  lidocaine (ASPERCREME W/LIDOCAINE) 4 % cream Apply 1 application topically as needed. 03/30/14   Donika K Patel, DO  montelukast (SINGULAIR) 10 MG tablet Take 10 mg by mouth.    Historical Provider, MD  morphine (MSIR) 15 MG tablet Take 1-2 tablets (15-30 mg total) by mouth every 6 (six) hours as needed for moderate pain or severe pain. 12/21/13   Barton Dubois, MD  ondansetron (ZOFRAN ODT) 8 MG disintegrating tablet Take 1 tablet (8 mg total) by mouth every 8 (eight) hours as needed for nausea or vomiting. 12/21/13   Barton Dubois, MD  polyethylene glycol (MIRALAX / GLYCOLAX) packet Take 17 g by mouth daily. 12/21/13   Barton Dubois, MD  tadalafil (CIALIS) 10 MG tablet  06/26/13   Historical Provider, MD  terbinafine (LAMISIL) 250 MG tablet  01/22/14   Historical Provider, MD  Vitamin D, Ergocalciferol, (DRISDOL) 50000 UNITS CAPS capsule  06/28/13   Historical Provider, MD   Physical Exam: Filed Vitals:   04/03/14 1643  BP: 152/93  Pulse: 73  Temp: 97.6 F (36.4 C)  TempSrc: Oral  Resp: 20  SpO2: 97%    Wt Readings from Last 3 Encounters:  03/30/14 151.955 kg (335 lb)  12/15/13 151.32 kg (333 lb 9.6 oz)  11/07/13 151.048 kg (333 lb)    General:  Appears calm and comfortable Eyes: PERRL, normal lids, irises & conjunctiva ENT: grossly normal hearing, lips & tongue Neck: no LAD, masses or thyromegaly Cardiovascular: RRR, no m/r/g. No LE edema. Respiratory: CTA bilaterally, no w/r/r. Normal respiratory effort. Abdomen: soft, ntnd Skin: no rash or  induration seen on limited exam Musculoskeletal: grossly normal tone BUE/BLE Psychiatric: grossly normal mood and affect, speech fluent and appropriate Neurologic: grossly non-focal.          Labs on Admission:  Basic Metabolic Panel:  Recent Labs Lab 04/03/14 1646  NA 140  K 4.3  CL 101  CO2 24  GLUCOSE 83  BUN 7  CREATININE 1.12  CALCIUM 9.8   Liver Function Tests:  Recent Labs Lab 04/03/14 1646  AST 19  ALT 15  ALKPHOS 84  BILITOT 0.3  PROT 7.9  ALBUMIN 3.9    Recent Labs Lab 04/03/14 1646  LIPASE 36   No results for input(s): AMMONIA in the last 168 hours. CBC:  Recent Labs Lab 04/03/14 1646  WBC 10.5  NEUTROABS 6.1  HGB 13.4  HCT 42.3  MCV 86.0  PLT 338  Cardiac Enzymes: No results for input(s): CKTOTAL, CKMB, CKMBINDEX, TROPONINI in the last 168 hours.  BNP (last 3 results) No results for input(s): PROBNP in the last 8760 hours. CBG: No results for input(s): GLUCAP in the last 168 hours.  Radiological Exams on Admission: Ct Abdomen Pelvis W Contrast  04/03/2014   CLINICAL DATA:  Worsening generalized abdominal pain for 4 weeks. Sigmoid diverticulitis. Gastric lap band.  EXAM: CT ABDOMEN AND PELVIS WITH CONTRAST  TECHNIQUE: Multidetector CT imaging of the abdomen and pelvis was performed using the standard protocol following bolus administration of intravenous contrast.  CONTRAST:  100 mL Omnipaque 300  COMPARISON:  12/15/2013  FINDINGS: Lower Chest:  Unremarkable.  Hepatobiliary: No masses or other significant abnormality identified. Gallbladder unremarkable.  Pancreas: No mass, inflammatory changes, or other parenchymal abnormality identified.  Spleen:  Within normal limits in size and appearance.  Adrenal Glands:  No mass identified.  Kidneys/Urinary Tract: No masses identified. No evidence of hydronephrosis.  Stomach/Bowel/Peritoneum: Gastric lap band remains in appropriate position. No evidence of bowel obstruction. Mild sigmoid diverticulitis  shows no significant change compared to previous study. There is a more well-defined gas and fluid collection in the adjacent sigmoid mesocolon which measures 2 x 3 cm on image 69 of series 2. No other abscess or free fluid identified. Normal appendix visualized.  Vascular/Lymphatic: No pathologically enlarged lymph nodes identified. No other significant abnormality identified.  Reproductive:  No mass or other significant abnormality identified.  Other: Small bilateral inguinal hernias containing only fat again demonstrated.  Musculoskeletal:  No suspicious bone lesions identified.  IMPRESSION: Persistent mild sigmoid diverticulitis, with more well-defined 2 x 3 cm diverticular abscess now seen in adjacent sigmoid mesocolon. No other complication identified.   Electronically Signed   By: Earle Gell M.D.   On: 04/03/2014 16:02      Assessment/Plan Active Problems:   Diverticulitis   Asthma   1. Diverticulitis -will be admitted due to increasing symptoms -will start on cipro and flagyl -will hydrate as tolerated -advance diet as tolerated -consider GI consult  2. Asthma -currently stable -will continue with home medications    Code Status: Full Code (must indicate code status--if unknown or must be presumed, indicate so) DVT Prophylaxis:Heparin Family Communication: Wife (indicate person spoken with, if applicable, with phone number if by telephone) Disposition Plan: Home (indicate anticipated LOS)  Time spent: 42min  KHAN,SAADAT A [facility] Pager [phone]

## 2014-04-04 DIAGNOSIS — K5720 Diverticulitis of large intestine with perforation and abscess without bleeding: Secondary | ICD-10-CM | POA: Diagnosis present

## 2014-04-04 DIAGNOSIS — K5732 Diverticulitis of large intestine without perforation or abscess without bleeding: Secondary | ICD-10-CM | POA: Diagnosis present

## 2014-04-04 LAB — COMPREHENSIVE METABOLIC PANEL
ALBUMIN: 3.3 g/dL — AB (ref 3.5–5.2)
ALK PHOS: 82 U/L (ref 39–117)
ALT: 13 U/L (ref 0–53)
AST: 12 U/L (ref 0–37)
Anion gap: 11 (ref 5–15)
BUN: 7 mg/dL (ref 6–23)
CHLORIDE: 100 meq/L (ref 96–112)
CO2: 26 mEq/L (ref 19–32)
Calcium: 9.1 mg/dL (ref 8.4–10.5)
Creatinine, Ser: 1.2 mg/dL (ref 0.50–1.35)
GFR calc Af Amer: 81 mL/min — ABNORMAL LOW (ref >90)
GFR calc non Af Amer: 70 mL/min — ABNORMAL LOW (ref >90)
GLUCOSE: 93 mg/dL (ref 70–99)
POTASSIUM: 3.7 meq/L (ref 3.7–5.3)
Sodium: 137 mEq/L (ref 137–147)
TOTAL PROTEIN: 6.8 g/dL (ref 6.0–8.3)
Total Bilirubin: 0.5 mg/dL (ref 0.3–1.2)

## 2014-04-04 LAB — CBC
HEMATOCRIT: 39.7 % (ref 39.0–52.0)
HEMOGLOBIN: 13.3 g/dL (ref 13.0–17.0)
MCH: 29.2 pg (ref 26.0–34.0)
MCHC: 33.5 g/dL (ref 30.0–36.0)
MCV: 87.3 fL (ref 78.0–100.0)
Platelets: 290 10*3/uL (ref 150–400)
RBC: 4.55 MIL/uL (ref 4.22–5.81)
RDW: 14.1 % (ref 11.5–15.5)
WBC: 9.2 10*3/uL (ref 4.0–10.5)

## 2014-04-04 LAB — TSH: TSH: 1.12 u[IU]/mL (ref 0.350–4.500)

## 2014-04-04 LAB — GLUCOSE, CAPILLARY: Glucose-Capillary: 87 mg/dL (ref 70–99)

## 2014-04-04 LAB — HEMOGLOBIN A1C
Hgb A1c MFr Bld: 5.3 % (ref <5.7)
Mean Plasma Glucose: 105 mg/dL (ref <117)

## 2014-04-04 MED ORDER — MORPHINE SULFATE 2 MG/ML IJ SOLN
2.0000 mg | INTRAMUSCULAR | Status: DC | PRN
Start: 2014-04-04 — End: 2014-04-10
  Administered 2014-04-04 – 2014-04-10 (×25): 2 mg via INTRAVENOUS
  Filled 2014-04-04 (×26): qty 1

## 2014-04-04 MED ORDER — PROMETHAZINE HCL 25 MG/ML IJ SOLN
12.5000 mg | Freq: Four times a day (QID) | INTRAMUSCULAR | Status: DC | PRN
  Administered 2014-04-04 – 2014-04-07 (×9): 25 mg via INTRAVENOUS
  Administered 2014-04-08: 12.5 mg via INTRAVENOUS
  Administered 2014-04-09 – 2014-04-10 (×4): 25 mg via INTRAVENOUS
  Filled 2014-04-04 (×14): qty 1

## 2014-04-04 MED ORDER — PNEUMOCOCCAL VAC POLYVALENT 25 MCG/0.5ML IJ INJ
0.5000 mL | INJECTION | INTRAMUSCULAR | Status: AC
  Administered 2014-04-05: 0.5 mL via INTRAMUSCULAR
  Filled 2014-04-04 (×3): qty 0.5

## 2014-04-04 NOTE — Consult Note (Signed)
Reason for Consult:  Diverticulitis with 2 x 3 cm abscess Referring Physician: Louellen Molder, MD PCP: [name], MD  Nathaniel Chambers is an 48 y.o. male.   HPI: 48 y/o presented with 4 weeks of discomfort, and Dr. Michail Sermon got a CT scan of abdomen showing acute sigmoid diverticulitis with 2 x 3 cm abscess.  He has had about 4 episodes of this since March this year.  Each time it gets better but returns in a month or two.  Currently he has: Mild sigmoid diverticulitis shows no significant change compared to previous study. There is a more well-defined gas and fluid collection in the adjacent sigmoid mesocolon which measures 2 x 3 cm on image 69 of series 2. No other abscess or free fluid identified. Normal appendix visualized.  Labs are normal with a WBC of 9.2. He is currently on day 2 of Cipro/flagyl IV, and on clear liquids.  We are ask to see.  Past Medical History  Diagnosis Date  . Asthma   . Pneumonia     history of  . 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 Leonides Schanz  . Meralgia paraesthetica March 2015    Bilaterally   Hx of tobacco use    Obesity Body mass index is 42.6     Hx of prior gastric banding   . Diverticulitis     Past Surgical History  Procedure Laterality Date  . Laparoscopic gastric banding  04-26-2007  . Repair recurrent right inguinal hernia  10-22-2008  . Surg. for undescended testicles/ bilateral inguinal hernia repair  AGE 14 MON OLD  . Left shoulder surg  2000    rotator cuff  . Hernia repair  08/2008    rt inguinal  . Shoulder arthroscopy  01/2009    rt   . Shoulder arthroscopy  07/01/2011    Procedure: ARTHROSCOPY SHOULDER;  Surgeon: Magnus Sinning, MD;  Location: [facility];  Service: Orthopedics;  Laterality: Left;  WITH LABRAL DEBRIDEMENT  . Shoulder open rotator cuff repair  07/01/2011    Procedure: ROTATOR CUFF REPAIR SHOULDER OPEN;  Surgeon: Magnus Sinning, MD;  Location: [place];  Service:  Orthopedics;  Laterality: Left;  OPEN DISTAL CLAVICLE REPAIR  . Vasectomy    . Leg surgery      cancer    Family History  Problem Relation Age of Onset  . Heart disease Father   . Cancer Mother     kidney, spleen    Social History:  reports that he quit smoking about 25 years ago. He has quit using smokeless tobacco. He reports that he does not drink alcohol or use illicit drugs.  Allergies:  Allergies  Allergen Reactions  . Penicillins Shortness Of Breath    Swelling of uvula  . Solu-Medrol [Methylprednisolone Acetate] Anaphylaxis  . Dilaudid [Hydromorphone Hcl] Nausea And Vomiting  . Oxycodone Nausea And Vomiting  . Percocet [Oxycodone-Acetaminophen] Nausea And Vomiting    Medications:  Prior to Admission:  Prescriptions prior to admission  Medication Sig Dispense Refill Last Dose  . albuterol (PROVENTIL HFA;VENTOLIN HFA) 108 (90 BASE) MCG/ACT inhaler Inhale 2 puffs into the lungs every 6 (six) hours as needed for wheezing or shortness of breath (wheezing).    04/03/2014 at Unknown time  . Fluticasone-Salmeterol (ADVAIR) 250-50 MCG/DOSE AEPB Inhale 1 puff into the lungs 2 (two) times daily.    04/03/2014 at Unknown time  . gabapentin (NEURONTIN) 300 MG capsule Take 300 mg by mouth  daily as needed (nerve pain).   04/02/2014 at Unknown time  . lidocaine (ASPERCREME W/LIDOCAINE) 4 % cream Apply 1 application topically as needed. 45 g 5 Past Month at Unknown time  . morphine (MSIR) 15 MG tablet Take 1-2 tablets (15-30 mg total) by mouth every 6 (six) hours as needed for moderate pain or severe pain. 30 tablet 0 Past Week at Unknown time  . polyethylene glycol (MIRALAX / GLYCOLAX) packet Take 17 g by mouth daily. (Patient taking differently: Take 17 g by mouth daily as needed for mild constipation. ) 14 each 0 Past Month at Unknown time  . tadalafil (CIALIS) 10 MG tablet Take 10 mg by mouth daily as needed for erectile dysfunction (erectile dysfunction).    Past Month at Unknown time   . Vitamin D, Ergocalciferol, (DRISDOL) 50000 UNITS CAPS capsule Take 50,000 Units by mouth every 7 (seven) days.    Past Week at Unknown time  . albuterol (PROVENTIL) (2.5 MG/3ML) 0.083% nebulizer solution Take 2.5 mg by nebulization every 6 (six) hours as needed for wheezing or shortness of breath.    unknown at unknown time  . clotrimazole-betamethasone (LOTRISONE) cream Apply 1 application topically 2 (two) times daily as needed.   0 unknown at unknown time  . DULoxetine (CYMBALTA) 30 MG capsule Take 1 capsule (30 mg total) by mouth daily. 30 capsule 3   . montelukast (SINGULAIR) 10 MG tablet Take 10 mg by mouth daily as needed (allergies).      . ondansetron (ZOFRAN ODT) 8 MG disintegrating tablet Take 1 tablet (8 mg total) by mouth every 8 (eight) hours as needed for nausea or vomiting. 20 tablet 0 unknown at unknown time  . terbinafine (LAMISIL) 250 MG tablet Take 250 mg by mouth daily.   2    Scheduled: . ciprofloxacin  400 mg Intravenous Q12H  . Fluticasone-Salmeterol  1 puff Inhalation BID  . folic acid  1 mg Oral Daily  . heparin  5,000 Units Subcutaneous 3 times per day  . metronidazole  500 mg Intravenous 3 times per day  . montelukast  10 mg Oral QHS  . multivitamin with minerals  1 tablet Oral Daily  . [START ON 04/05/2014] pneumococcal 23 valent vaccine  0.5 mL Intramuscular Tomorrow-1000  . polyethylene glycol  17 g Oral Daily  . thiamine  100 mg Oral Daily   Continuous: . sodium chloride 75 mL/hr at 04/03/14 2015   [id number], morphine, morphine injection, promethazine Anti-infectives    Start     Dose/Rate Route Frequency Ordered Stop   04/04/14 0600  ciprofloxacin (CIPRO) IVPB 400 mg     400 mg200 mL/hr over 60 Minutes Intravenous Every 12 hours 04/03/14 1905     04/03/14 2100  metroNIDAZOLE (FLAGYL) IVPB 500 mg     500 mg100 mL/hr over 60 Minutes Intravenous 3 times per day 04/03/14 2004     04/03/14 1815  ciprofloxacin (CIPRO) IVPB 400 mg     400 mg200 mL/hr  over 60 Minutes Intravenous  Once 04/03/14 1803 04/03/14 1925   04/03/14 1815  metroNIDAZOLE (FLAGYL) IVPB 500 mg  Status:  Discontinued     500 mg100 mL/hr over 60 Minutes Intravenous  Once 04/03/14 1803 04/03/14 2012      Results for orders placed or performed during the hospital encounter of 04/03/14 (from the past 48 hour(s))  CBC with Differential     Status: None   Collection Time: 04/03/14  4:46 PM  Result Value Ref Range  WBC 10.5 4.0 - 10.5 K/uL   RBC 4.92 4.22 - 5.81 MIL/uL   Hemoglobin 13.4 13.0 - 17.0 g/dL   HCT 42.3 39.0 - 52.0 %   MCV 86.0 78.0 - 100.0 fL   MCH 27.2 26.0 - 34.0 pg   MCHC 31.7 30.0 - 36.0 g/dL   RDW 14.1 11.5 - 15.5 %   Platelets 338 150 - 400 K/uL   Neutrophils Relative % 59 43 - 77 %   Neutro Abs 6.1 1.7 - 7.7 K/uL   Lymphocytes Relative 34 12 - 46 %   Lymphs Abs 3.6 0.7 - 4.0 K/uL   Monocytes Relative 6 3 - 12 %   Monocytes Absolute 0.6 0.1 - 1.0 K/uL   Eosinophils Relative 1 0 - 5 %   Eosinophils Absolute 0.2 0.0 - 0.7 K/uL   Basophils Relative 0 0 - 1 %   Basophils Absolute 0.0 0.0 - 0.1 K/uL  Comprehensive metabolic panel     Status: Abnormal   Collection Time: 04/03/14  4:46 PM  Result Value Ref Range   Sodium 140 137 - 147 mEq/L   Potassium 4.3 3.7 - 5.3 mEq/L   Chloride 101 96 - 112 mEq/L   CO2 24 19 - 32 mEq/L   Glucose, Bld 83 70 - 99 mg/dL   BUN 7 6 - 23 mg/dL   Creatinine, Ser 1.12 0.50 - 1.35 mg/dL   Calcium 9.8 8.4 - 10.5 mg/dL   Total Protein 7.9 6.0 - 8.3 g/dL   Albumin 3.9 3.5 - 5.2 g/dL   AST 19 0 - 37 U/L   ALT 15 0 - 53 U/L   Alkaline Phosphatase 84 39 - 117 U/L   Total Bilirubin 0.3 0.3 - 1.2 mg/dL   GFR calc non Af Amer 76 (L) >90 mL/min   GFR calc Af Amer 88 (L) >90 mL/min    Comment: (NOTE) The eGFR has been calculated using the CKD EPI equation. This calculation has not been validated in all clinical situations. eGFR's persistently <90 mL/min signify possible Chronic Kidney Disease.    Anion gap 15 5 - 15   Lipase, blood     Status: None   Collection Time: 04/03/14  4:46 PM  Result Value Ref Range   Lipase 36 11 - 59 U/L  I-stat troponin, ED (only if pt is 48 y.o. or older & pain is above umbilicus) - do not order at [facility]     Status: None   Collection Time: 04/03/14  5:07 PM  Result Value Ref Range   Troponin i, poc 0.00 0.00 - 0.08 ng/mL   Comment 3            Comment: Due to the release kinetics of cTnI, a negative result within the first hours of the onset of symptoms does not rule out myocardial infarction with certainty. If myocardial infarction is still suspected, repeat the test at appropriate intervals.   Urinalysis, Routine w reflex microscopic     Status: Abnormal   Collection Time: 04/03/14  9:31 PM  Result Value Ref Range   Color, Urine YELLOW YELLOW   APPearance CLEAR CLEAR   Specific Gravity, Urine 1.046 (H) 1.005 - 1.030   pH 5.5 5.0 - 8.0   Glucose, UA NEGATIVE NEGATIVE mg/dL   Hgb urine dipstick MODERATE (A) NEGATIVE   Bilirubin Urine NEGATIVE NEGATIVE   Ketones, ur NEGATIVE NEGATIVE mg/dL   Protein, ur NEGATIVE NEGATIVE mg/dL   Urobilinogen, UA 0.2 0.0 - 1.0  mg/dL   Nitrite NEGATIVE NEGATIVE   Leukocytes, UA NEGATIVE NEGATIVE  Urine microscopic-add on     Status: Abnormal   Collection Time: 04/03/14  9:31 PM  Result Value Ref Range   Squamous Epithelial / LPF RARE RARE   RBC / HPF 0-2 <3 RBC/hpf   Bacteria, UA FEW (A) RARE  TSH     Status: None   Collection Time: 04/04/14  5:10 AM  Result Value Ref Range   TSH 1.120 0.350 - 4.500 uIU/mL    Comment: Performed at [facility]  Hemoglobin A1c     Status: None   Collection Time: 04/04/14  5:10 AM  Result Value Ref Range   Hgb A1c MFr Bld 5.3 <5.7 %    Comment: (NOTE)                                                                       According to the ADA Clinical Practice Recommendations for 2011, when HbA1c is used as a screening test:  >=6.5%   Diagnostic of Diabetes Mellitus           (if  abnormal result is confirmed) 5.7-6.4%   Increased risk of developing Diabetes Mellitus References:Diagnosis and Classification of Diabetes Mellitus,Diabetes KZLD,3570,17(BLTJQ 1):S62-S69 and Standards of Medical Care in         Diabetes - 2011,Diabetes Care,2011,34 (Suppl 1):S11-S61.    Mean Plasma Glucose 105 <117 mg/dL    Comment: Performed at [facility]  Comprehensive metabolic panel     Status: Abnormal   Collection Time: 04/04/14  5:10 AM  Result Value Ref Range   Sodium 137 137 - 147 mEq/L   Potassium 3.7 3.7 - 5.3 mEq/L   Chloride 100 96 - 112 mEq/L   CO2 26 19 - 32 mEq/L   Glucose, Bld 93 70 - 99 mg/dL   BUN 7 6 - 23 mg/dL   Creatinine, Ser 1.20 0.50 - 1.35 mg/dL   Calcium 9.1 8.4 - 10.5 mg/dL   Total Protein 6.8 6.0 - 8.3 g/dL   Albumin 3.3 (L) 3.5 - 5.2 g/dL   AST 12 0 - 37 U/L   ALT 13 0 - 53 U/L   Alkaline Phosphatase 82 39 - 117 U/L   Total Bilirubin 0.5 0.3 - 1.2 mg/dL   GFR calc non Af Amer 70 (L) >90 mL/min   GFR calc Af Amer 81 (L) >90 mL/min    Comment: (NOTE) The eGFR has been calculated using the CKD EPI equation. This calculation has not been validated in all clinical situations. eGFR's persistently <90 mL/min signify possible Chronic Kidney Disease.    Anion gap 11 5 - 15  CBC     Status: None   Collection Time: 04/04/14  5:10 AM  Result Value Ref Range   WBC 9.2 4.0 - 10.5 K/uL   RBC 4.55 4.22 - 5.81 MIL/uL   Hemoglobin 13.3 13.0 - 17.0 g/dL   HCT 39.7 39.0 - 52.0 %   MCV 87.3 78.0 - 100.0 fL   MCH 29.2 26.0 - 34.0 pg   MCHC 33.5 30.0 - 36.0 g/dL   RDW 14.1 11.5 - 15.5 %   Platelets 290 150 - 400 K/uL  Glucose, capillary  Status: None   Collection Time: 04/04/14  7:11 AM  Result Value Ref Range   Glucose-Capillary 87 70 - 99 mg/dL   Comment 1 Notify RN     Ct Abdomen Pelvis W Contrast  04/03/2014   CLINICAL DATA:  Worsening generalized abdominal pain for 4 weeks. Sigmoid diverticulitis. Gastric lap band.  EXAM: CT ABDOMEN AND  PELVIS WITH CONTRAST  TECHNIQUE: Multidetector CT imaging of the abdomen and pelvis was performed using the standard protocol following bolus administration of intravenous contrast.  CONTRAST:  100 mL Omnipaque 300  COMPARISON:  12/15/2013  FINDINGS: Lower Chest:  Unremarkable.  Hepatobiliary: No masses or other significant abnormality identified. Gallbladder unremarkable.  Pancreas: No mass, inflammatory changes, or other parenchymal abnormality identified.  Spleen:  Within normal limits in size and appearance.  Adrenal Glands:  No mass identified.  Kidneys/Urinary Tract: No masses identified. No evidence of hydronephrosis.  Stomach/Bowel/Peritoneum: Gastric lap band remains in appropriate position. No evidence of bowel obstruction. Mild sigmoid diverticulitis shows no significant change compared to previous study. There is a more well-defined gas and fluid collection in the adjacent sigmoid mesocolon which measures 2 x 3 cm on image 69 of series 2. No other abscess or free fluid identified. Normal appendix visualized.  Vascular/Lymphatic: No pathologically enlarged lymph nodes identified. No other significant abnormality identified.  Reproductive:  No mass or other significant abnormality identified.  Other: Small bilateral inguinal hernias containing only fat again demonstrated.  Musculoskeletal:  No suspicious bone lesions identified.  IMPRESSION: Persistent mild sigmoid diverticulitis, with more well-defined 2 x 3 cm diverticular abscess now seen in adjacent sigmoid mesocolon. No other complication identified.   Electronically Signed   By: Earle Gell M.D.   On: 04/03/2014 16:02    Review of Systems  Constitutional: Negative.   HENT: Negative.   Eyes: Negative.   Respiratory: Negative.   Cardiovascular: Negative.   Gastrointestinal: Positive for abdominal pain. Negative for heartburn, nausea, vomiting, diarrhea, constipation, blood in stool and melena.  Genitourinary: Negative.   Musculoskeletal:        Pain upper Thighs bilat.  Skin: Negative.   Neurological: Negative.   Endo/Heme/Allergies: Negative.   Psychiatric/Behavioral: Negative.    Blood pressure 122/72, pulse 65, temperature 98.1 F (36.7 C), temperature source Oral, resp. rate 18, height 6' 2"  (1.88 m), weight 150.64 kg (332 lb 1.6 oz), SpO2 99 %. Physical Exam  Constitutional: He is oriented to person, place, and time. He appears well-developed and well-nourished. No distress.  Body mass index is 42.62 kg/(m^2).  HENT:  Head: Normocephalic and atraumatic.  Nose: Nose normal.  Eyes: Conjunctivae and EOM are normal. Right eye exhibits no discharge. Left eye exhibits no discharge. No scleral icterus.  Neck: Normal range of motion. Neck supple. No JVD present. No tracheal deviation present. No thyromegaly present.  Cardiovascular: Normal rate, regular rhythm, normal heart sounds and intact distal pulses.   No murmur heard. Respiratory: Effort normal and breath sounds normal. No respiratory distress. He has no wheezes. He has no rales. He exhibits no tenderness.  GI: Soft. Bowel sounds are normal. He exhibits no distension and no mass. There is tenderness (mid lower abdomen tenderness). There is no rebound and no guarding.  Scars and port from previous lap band.  Musculoskeletal: He exhibits no edema or tenderness.  Lymphadenopathy:    He has no cervical adenopathy.  Neurological: He is alert and oriented to person, place, and time. No cranial nerve deficit.  Skin: Skin is warm and dry.  No rash noted. He is not diaphoretic. No erythema. No pallor.  Psychiatric: He has a normal mood and affect. His behavior is normal. Judgment and thought content normal.    Assessment/Plan: 1.  Recurrent Diverticulitis with 2 x 3 cm abscess 2.  Hx of asthma 3.  Body mass index is 42.6 with prior gastric banding - Dr. Johnathan Hausen 4.  Desmoid fibromatosis 5.  Meralgia paraesthetica   Plan:  Medical management for now, he may need to  have partial colectomy in the future.  We will follow with you. Agree with clears, bowel rest, and IV antibiotics.  Nathaniel Chambers 04/04/2014, 3:07 PM

## 2014-04-04 NOTE — Progress Notes (Addendum)
[facility] PROGRESS NOTE  Nathaniel Chambers IOX:[id number] DOB: [birth year] DOA: 04/03/2014 PCP: [name], MD   brief narrative 48 y/o obese male with 2 episodes of sigmoid diverticulitis since this year, last admitted in august and treated with antibiotics, s/p colonoscopy after discharge by Dr Michail Sermon , asthma, fatty liver and  dermatofibroma of left thigh and hip ( excised in march 2015 at [facility])  Presented to the ED  for abdominal pain for past 4 weeks similar to acute diverticulitis in past. He called Dr Kathline Magic office as his symptoms did not improve and was sent for CT of the abdomen which showed acute sigmoid diverticulitis with 2x3 cm abscess.  Patient admitted for further management on empiric abx.  He denies any fever, chills, N/V or diarrhea.  Assessment/Plan: Acute recurrent sigmoid diverticulitis with abscess. On empiric cipro and flagyl. Pain control with prn morphine. Pt afebrile and normal wbc. No other GI symptoms besides pain.  continue clear liquids. Supportive care with prn morphine. And IV fluids  GI Dr Michail Sermon informed , will see him later today.  surgery consulted to evaluate sigmoid abscess.  Asthma  stable. continue homer inhaler  obesity  counseled on weight loss. Patient reports exercising more regularly since past  few months.  Diet: clears  DVT prophylaxis: sq heparin   Code Status: full code Family Communication: none at bedside Disposition Plan: currently inpt   Consultants:  [facility]  [facility]  Procedures:  none  Antibiotics:  IV cipro and flagyl since 12/8--  HPI/Subjective: Reports abdominal soreness over b/l lower quadrant and infraumbilical area, no N/V or diarrhea. afebrile  Objective: Filed Vitals:   04/04/14 0536  BP: 122/79  Pulse: 55  Temp: 98 F (36.7 C)  Resp: 16    Intake/Output Summary (Last 24 hours) at 04/04/14 0944 Last data filed at 04/04/14 0930  Gross per 24 hour  Intake      0 ml  Output     400 ml  Net   -400 ml   Filed Weights   04/03/14 1956  Weight: 150.64 kg (332 lb 1.6 oz)    Exam:   General:  Middle aged obese male in NAD   HEENT: moist oral mucosa   chest: clear b/l, no added sounds   CVS: N S1&S2, no murmurs  Abd: soft, ND, mild tenderness over b/l lover quadrant ad periumbilical area, BS+  EXT: warm, no edema, surgical scar over pt thigh to the hip  Data Reviewed: Basic Metabolic Panel:  Recent Labs Lab 04/03/14 1646 04/04/14 0510  NA 140 137  K 4.3 3.7  CL 101 100  CO2 24 26  GLUCOSE 83 93  BUN 7 7  CREATININE 1.12 1.20  CALCIUM 9.8 9.1   Liver Function Tests:  Recent Labs Lab 04/03/14 1646 04/04/14 0510  AST 19 12  ALT 15 13  ALKPHOS 84 82  BILITOT 0.3 0.5  PROT 7.9 6.8  ALBUMIN 3.9 3.3*    Recent Labs Lab 04/03/14 1646  LIPASE 36   No results for input(s): AMMONIA in the last 168 hours. CBC:  Recent Labs Lab 04/03/14 1646 04/04/14 0510  WBC 10.5 9.2  NEUTROABS 6.1  --   HGB 13.4 13.3  HCT 42.3 39.7  MCV 86.0 87.3  PLT 338 290   Cardiac Enzymes: No results for input(s): CKTOTAL, CKMB, CKMBINDEX, TROPONINI in the last 168 hours. BNP (last 3 results) No results for input(s): PROBNP in the last 8760 hours. CBG:  Recent Labs Lab  04/04/14 0711  GLUCAP 87    No results found for this or any previous visit (from the past 240 hour(s)).   Studies: Ct Abdomen Pelvis W Contrast  04/03/2014   CLINICAL DATA:  Worsening generalized abdominal pain for 4 weeks. Sigmoid diverticulitis. Gastric lap band.  EXAM: CT ABDOMEN AND PELVIS WITH CONTRAST  TECHNIQUE: Multidetector CT imaging of the abdomen and pelvis was performed using the standard protocol following bolus administration of intravenous contrast.  CONTRAST:  100 mL Omnipaque 300  COMPARISON:  12/15/2013  FINDINGS: Lower Chest:  Unremarkable.  Hepatobiliary: No masses or other significant abnormality identified. Gallbladder unremarkable.  Pancreas: No mass,  inflammatory changes, or other parenchymal abnormality identified.  Spleen:  Within normal limits in size and appearance.  Adrenal Glands:  No mass identified.  Kidneys/Urinary Tract: No masses identified. No evidence of hydronephrosis.  Stomach/Bowel/Peritoneum: Gastric lap band remains in appropriate position. No evidence of bowel obstruction. Mild sigmoid diverticulitis shows no significant change compared to previous study. There is a more well-defined gas and fluid collection in the adjacent sigmoid mesocolon which measures 2 x 3 cm on image 69 of series 2. No other abscess or free fluid identified. Normal appendix visualized.  Vascular/Lymphatic: No pathologically enlarged lymph nodes identified. No other significant abnormality identified.  Reproductive:  No mass or other significant abnormality identified.  Other: Small bilateral inguinal hernias containing only fat again demonstrated.  Musculoskeletal:  No suspicious bone lesions identified.  IMPRESSION: Persistent mild sigmoid diverticulitis, with more well-defined 2 x 3 cm diverticular abscess now seen in adjacent sigmoid mesocolon. No other complication identified.   Electronically Signed   By: Earle Gell M.D.   On: 04/03/2014 16:02    Scheduled Meds: . ciprofloxacin  400 mg Intravenous Q12H  . Fluticasone-Salmeterol  1 puff Inhalation BID  . folic acid  1 mg Oral Daily  . heparin  5,000 Units Subcutaneous 3 times per day  . metronidazole  500 mg Intravenous 3 times per day  . montelukast  10 mg Oral QHS  . multivitamin with minerals  1 tablet Oral Daily  . polyethylene glycol  17 g Oral Daily  . thiamine  100 mg Oral Daily   Continuous Infusions: . sodium chloride 75 mL/hr at 04/03/14 2015      Time spent: 25 minutes    Louellen Molder  [facility] Pager [phone] If 7PM-7AM, please contact night-coverage at [email or web address], password [facility] 04/04/2014, 9:44 AM  LOS: 1 day

## 2014-04-04 NOTE — Consult Note (Addendum)
Referring Provider: Dr. Clementeen Graham Primary Care Physician:  [name], MD Primary Gastroenterologist:  Dr. Michail Sermon  Reason for Consultation:  Diverticulitis  HPI: Nathaniel Chambers is a 48 y.o. male is admitted for sigmoid diverticulitis and an abscess (2 X 3 cm) noted on a CT scan yesterday. He had an episode of diverticulitis in May 2015 and in August 2015 both noted on CT scan and was treated with oral Abx as outpt in May and IV Abx as inpt in August. Colonoscopy in July 2015 showed sigmoid diverticulosis, small internal hemorrhoids, and 3 small adenomas were noted and removed. Reports that the current onset of abdominal pain started on 03/16/14 and has been constant since then. The pain has been throughout his abdomen. He denies any fevers, chills, N/V/diarrhea/melena/hematochezia.   Past Medical History  Diagnosis Date  . Asthma   . Pneumonia     history of  . 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 Leonides Schanz  . Meralgia paraesthetica March 2015    Bilaterally  . Diverticulitis     Past Surgical History  Procedure Laterality Date  . Laparoscopic gastric banding  04-26-2007  . Repair recurrent right inguinal hernia  10-22-2008  . Surg. for undescended testicles/ bilateral inguinal hernia repair  AGE 12 MON OLD  . Left shoulder surg  2000    rotator cuff  . Hernia repair  08/2008    rt inguinal  . Shoulder arthroscopy  01/2009    rt   . Shoulder arthroscopy  07/01/2011    Procedure: ARTHROSCOPY SHOULDER;  Surgeon: Magnus Sinning, MD;  Location: [facility];  Service: Orthopedics;  Laterality: Left;  WITH LABRAL DEBRIDEMENT  . Shoulder open rotator cuff repair  07/01/2011    Procedure: ROTATOR CUFF REPAIR SHOULDER OPEN;  Surgeon: Magnus Sinning, MD;  Location: [place];  Service: Orthopedics;  Laterality: Left;  OPEN DISTAL CLAVICLE REPAIR  . Vasectomy    . Leg surgery      cancer    Prior to Admission medications   Medication Sig  Start Date End Date Taking? Authorizing Provider  albuterol (PROVENTIL HFA;VENTOLIN HFA) 108 (90 BASE) MCG/ACT inhaler Inhale 2 puffs into the lungs every 6 (six) hours as needed for wheezing or shortness of breath (wheezing).    Yes Historical Provider, MD  Fluticasone-Salmeterol (ADVAIR) 250-50 MCG/DOSE AEPB Inhale 1 puff into the lungs 2 (two) times daily.    Yes Historical Provider, MD  gabapentin (NEURONTIN) 300 MG capsule Take 300 mg by mouth daily as needed (nerve pain).   Yes Historical Provider, MD  lidocaine (ASPERCREME W/LIDOCAINE) 4 % cream Apply 1 application topically as needed. 03/30/14  Yes Donika K Patel, DO  morphine (MSIR) 15 MG tablet Take 1-2 tablets (15-30 mg total) by mouth every 6 (six) hours as needed for moderate pain or severe pain. 12/21/13  Yes Barton Dubois, MD  polyethylene glycol [facility] / GLYCOLAX) packet Take 17 g by mouth daily. Patient taking differently: Take 17 g by mouth daily as needed for mild constipation.  12/21/13  Yes Barton Dubois, MD  tadalafil (CIALIS) 10 MG tablet Take 10 mg by mouth daily as needed for erectile dysfunction (erectile dysfunction).  06/26/13  Yes Historical Provider, MD  Vitamin D, Ergocalciferol, (DRISDOL) 50000 UNITS CAPS capsule Take 50,000 Units by mouth every 7 (seven) days.  06/28/13  Yes Historical Provider, MD  albuterol (PROVENTIL) (2.5 MG/3ML) 0.083% nebulizer solution Take 2.5 mg by nebulization every 6 (six) hours as  needed for wheezing or shortness of breath.     Historical Provider, MD  clotrimazole-betamethasone (LOTRISONE) cream Apply 1 application topically 2 (two) times daily as needed.  02/06/14   Historical Provider, MD  DULoxetine (CYMBALTA) 30 MG capsule Take 1 capsule (30 mg total) by mouth daily. 03/30/14   Donika K Patel, DO  montelukast (SINGULAIR) 10 MG tablet Take 10 mg by mouth daily as needed (allergies).     Historical Provider, MD  ondansetron (ZOFRAN ODT) 8 MG disintegrating tablet Take 1 tablet (8 mg total) by  mouth every 8 (eight) hours as needed for nausea or vomiting. 12/21/13   Barton Dubois, MD  terbinafine (LAMISIL) 250 MG tablet Take 250 mg by mouth daily.  01/22/14   Historical Provider, MD    Scheduled Meds: . ciprofloxacin  400 mg Intravenous Q12H  . Fluticasone-Salmeterol  1 puff Inhalation BID  . folic acid  1 mg Oral Daily  . heparin  5,000 Units Subcutaneous 3 times per day  . metronidazole  500 mg Intravenous 3 times per day  . montelukast  10 mg Oral QHS  . multivitamin with minerals  1 tablet Oral Daily  . [START ON 04/05/2014] pneumococcal 23 valent vaccine  0.5 mL Intramuscular Tomorrow-1000  . polyethylene glycol  17 g Oral Daily  . thiamine  100 mg Oral Daily   Continuous Infusions: . sodium chloride 75 mL/hr at 04/03/14 2015   PRN Meds:.albuterol, morphine, morphine injection, promethazine  Allergies as of 04/03/2014 - Review Complete 04/03/2014  Allergen Reaction Noted  . Penicillins Shortness Of Breath 06/26/2011  . Solu-medrol [methylprednisolone acetate] Anaphylaxis 06/26/2011  . Dilaudid [hydromorphone hcl] Nausea And Vomiting 11/07/2013  . Oxycodone Nausea And Vomiting 06/26/2011  . Percocet [oxycodone-acetaminophen] Nausea And Vomiting 11/07/2013    Family History  Problem Relation Age of Onset  . Heart disease Father   . Cancer Mother     kidney, spleen    History   Social History  . Marital Status: Married    Spouse Name: N/A    Number of Children: N/A  . Years of Education: N/A   Occupational History  . Not on file.   Social History Main Topics  . Smoking status: Former Smoker -- 0.25 packs/day for 1 years    Quit date: 06/25/1988  . Smokeless tobacco: Former Systems developer     Comment: smoked socially  . Alcohol Use: No  . Drug Use: No  . Sexual Activity: Yes   Other Topics Concern  . Not on file   Social History Narrative   He works in Therapist, art for Engineer, drilling.   He lives with his wife and they have three daughters.   Highest  level of education:  Some college          Review of Systems: All negative except as stated above in HPI.  Physical Exam: Vital signs: Filed Vitals:   04/04/14 1320  BP: 122/72  Pulse: 65  Temp: 98.1 F (36.7 C)  Resp: 18   Last BM Date: 04/03/14 General:   Alert,  Well-developed, well-nourished, pleasant and cooperative in NAD HEENT: anicteric Lungs:  Clear throughout to auscultation.   No wheezes, crackles, or rhonchi. No acute distress. Heart:  Regular rate and rhythm; no murmurs, clicks, rubs,  or gallops. Abdomen: diffuse tenderness with guarding (greatest in suprapubic area), soft, nondistended, +BS  Rectal:  Deferred Ext: no edema  GI:  Lab Results:  Recent Labs  04/03/14 1646 04/04/14 0510  WBC 10.5 9.2  HGB 13.4 13.3  HCT 42.3 39.7  PLT 338 290   BMET  Recent Labs  04/03/14 1646 04/04/14 0510  NA 140 137  K 4.3 3.7  CL 101 100  CO2 24 26  GLUCOSE 83 93  BUN 7 7  CREATININE 1.12 1.20  CALCIUM 9.8 9.1   LFT  Recent Labs  04/04/14 0510  PROT 6.8  ALBUMIN 3.3*  AST 12  ALT 13  ALKPHOS 82  BILITOT 0.5   PT/INR No results for input(s): LABPROT, INR in the last 72 hours.   Studies/Results: Ct Abdomen Pelvis W Contrast  04/03/2014   CLINICAL DATA:  Worsening generalized abdominal pain for 4 weeks. Sigmoid diverticulitis. Gastric lap band.  EXAM: CT ABDOMEN AND PELVIS WITH CONTRAST  TECHNIQUE: Multidetector CT imaging of the abdomen and pelvis was performed using the standard protocol following bolus administration of intravenous contrast.  CONTRAST:  100 mL Omnipaque 300  COMPARISON:  12/15/2013  FINDINGS: Lower Chest:  Unremarkable.  Hepatobiliary: No masses or other significant abnormality identified. Gallbladder unremarkable.  Pancreas: No mass, inflammatory changes, or other parenchymal abnormality identified.  Spleen:  Within normal limits in size and appearance.  Adrenal Glands:  No mass identified.  Kidneys/Urinary Tract: No masses  identified. No evidence of hydronephrosis.  Stomach/Bowel/Peritoneum: Gastric lap band remains in appropriate position. No evidence of bowel obstruction. Mild sigmoid diverticulitis shows no significant change compared to previous study. There is a more well-defined gas and fluid collection in the adjacent sigmoid mesocolon which measures 2 x 3 cm on image 69 of series 2. No other abscess or free fluid identified. Normal appendix visualized.  Vascular/Lymphatic: No pathologically enlarged lymph nodes identified. No other significant abnormality identified.  Reproductive:  No mass or other significant abnormality identified.  Other: Small bilateral inguinal hernias containing only fat again demonstrated.  Musculoskeletal:  No suspicious bone lesions identified.  IMPRESSION: Persistent mild sigmoid diverticulitis, with more well-defined 2 x 3 cm diverticular abscess now seen in adjacent sigmoid mesocolon. No other complication identified.   Electronically Signed   By: Earle Gell M.D.   On: 04/03/2014 16:02    Impression/Plan:  48 yo with recurrent diverticulitis (3rd episode this year) now also with an abscess noted in the sigmoid region. Continue Cipro/Flagyl. Surgical consult. May need percutaneous drainage of abscess. If he fails to improve with Abx and/or percutaneous drainage is not possible, then may need surgery especially if the abscess persists. Supportive care. Clear liquid diet. IVFs. Will follow.   LOS: 1 day   [place] C.  04/04/2014, 2:34 PM

## 2014-04-05 LAB — GLUCOSE, CAPILLARY: GLUCOSE-CAPILLARY: 88 mg/dL (ref 70–99)

## 2014-04-05 NOTE — Progress Notes (Signed)
[facility] PROGRESS NOTE  Nathaniel Chambers IRC:[id number] DOB: 10-11-1965 DOA: 04/03/2014 PCP: [name], MD   brief narrative 48 y/o obese male with 2 episodes of sigmoid diverticulitis since this year, last admitted in august and treated with antibiotics, s/p colonoscopy after discharge by Dr Michail Sermon , asthma, fatty liver and  dermatofibroma of left thigh and hip ( excised in march 2015 at [facility]). Patient presented to the ED  for abdominal pain for past 4 weeks similar to acute diverticulitis in past. He called Dr Kathline Magic office as his symptoms did not improve and was sent for CT of the abdomen which showed acute sigmoid diverticulitis with 2x3 cm abscess. Patient admitted for further management on empiric abx.  Gen. surgery was also consulted and recommended medical management, will likely need semielective resection at the later date.  Assessment/Plan: Acute recurrent sigmoid diverticulitis with sigmoid abscess: Third episode this year - Continue IV ciprofloxacin and Flagyl, improving.  - Diet advanced to full liquid diet, continue IV fluids  - Surgery and GI following. Per surgery, continue current medical management, will likely need semielective resection at a later date, barium enema in 6 weeks if symptoms resolved.  - GI following as well  Asthma  stable, continue homer inhaler  obesity -Counseled on diet and weight control,patient reports exercising more regularly  DVT prophylaxis: sq heparin  Code Status: full code  Family Communication: none at bedside  Disposition Plan: Hopefully DC next 24-48 hours   Consultants:  [facility]  Gen. surgery  Procedures:  none  Antibiotics:  IV cipro and flagyl since 12/8--  Subjective: Afebrile, overall improving, still has pain in the left lower quadrant, although improving   Objective: Filed Vitals:   04/05/14 0508  BP: 124/67  Pulse: 60  Temp: 97.7 F (36.5 C)  Resp: 16    Intake/Output Summary  (Last 24 hours) at 04/05/14 1130 Last data filed at 04/05/14 1119  Gross per 24 hour  Intake    600 ml  Output   3250 ml  Net  -2650 ml   Filed Weights   04/03/14 1956  Weight: 150.64 kg (332 lb 1.6 oz)    Exam:   Gen.: Alert and oriented 3, NAD  CVS: S1 and S2 clear  Chest: Clear to auscultation bilaterally  Abd: soft, ND, mild tenderness over left lower quadrant, pain and umbilical area, NBS  Ext: no c/c/ e  Data Reviewed: Basic Metabolic Panel:  Recent Labs Lab 04/03/14 1646 04/04/14 0510  NA 140 137  K 4.3 3.7  CL 101 100  CO2 24 26  GLUCOSE 83 93  BUN 7 7  CREATININE 1.12 1.20  CALCIUM 9.8 9.1   Liver Function Tests:  Recent Labs Lab 04/03/14 1646 04/04/14 0510  AST 19 12  ALT 15 13  ALKPHOS 84 82  BILITOT 0.3 0.5  PROT 7.9 6.8  ALBUMIN 3.9 3.3*    Recent Labs Lab 04/03/14 1646  LIPASE 36   No results for input(s): AMMONIA in the last 168 hours. CBC:  Recent Labs Lab 04/03/14 1646 04/04/14 0510  WBC 10.5 9.2  NEUTROABS 6.1  --   HGB 13.4 13.3  HCT 42.3 39.7  MCV 86.0 87.3  PLT 338 290   Cardiac Enzymes: No results for input(s): CKTOTAL, CKMB, CKMBINDEX, TROPONINI in the last 168 hours. BNP (last 3 results) No results for input(s): PROBNP in the last 8760 hours. CBG:  Recent Labs Lab 04/04/14 0711 04/05/14 0736  GLUCAP 87 88  No results found for this or any previous visit (from the past 240 hour(s)).   Studies: Ct Abdomen Pelvis W Contrast  04/03/2014   CLINICAL DATA:  Worsening generalized abdominal pain for 4 weeks. Sigmoid diverticulitis. Gastric lap band.  EXAM: CT ABDOMEN AND PELVIS WITH CONTRAST  TECHNIQUE: Multidetector CT imaging of the abdomen and pelvis was performed using the standard protocol following bolus administration of intravenous contrast.  CONTRAST:  100 mL Omnipaque 300  COMPARISON:  12/15/2013  FINDINGS: Lower Chest:  Unremarkable.  Hepatobiliary: No masses or other significant abnormality  identified. Gallbladder unremarkable.  Pancreas: No mass, inflammatory changes, or other parenchymal abnormality identified.  Spleen:  Within normal limits in size and appearance.  Adrenal Glands:  No mass identified.  Kidneys/Urinary Tract: No masses identified. No evidence of hydronephrosis.  Stomach/Bowel/Peritoneum: Gastric lap band remains in appropriate position. No evidence of bowel obstruction. Mild sigmoid diverticulitis shows no significant change compared to previous study. There is a more well-defined gas and fluid collection in the adjacent sigmoid mesocolon which measures 2 x 3 cm on image 69 of series 2. No other abscess or free fluid identified. Normal appendix visualized.  Vascular/Lymphatic: No pathologically enlarged lymph nodes identified. No other significant abnormality identified.  Reproductive:  No mass or other significant abnormality identified.  Other: Small bilateral inguinal hernias containing only fat again demonstrated.  Musculoskeletal:  No suspicious bone lesions identified.  IMPRESSION: Persistent mild sigmoid diverticulitis, with more well-defined 2 x 3 cm diverticular abscess now seen in adjacent sigmoid mesocolon. No other complication identified.   Electronically Signed   By: Earle Gell M.D.   On: 04/03/2014 16:02    Scheduled Meds: . ciprofloxacin  400 mg Intravenous Q12H  . Fluticasone-Salmeterol  1 puff Inhalation BID  . folic acid  1 mg Oral Daily  . heparin  5,000 Units Subcutaneous 3 times per day  . metronidazole  500 mg Intravenous 3 times per day  . montelukast  10 mg Oral QHS  . multivitamin with minerals  1 tablet Oral Daily  . polyethylene glycol  17 g Oral Daily  . thiamine  100 mg Oral Daily   Continuous Infusions: . sodium chloride 75 mL/hr at 04/03/14 2015      Time spent: 25 minutes    RAI,RIPUDEEP M.D. [facility] 04/05/2014, 11:35 AM  Pager: [phone]

## 2014-04-05 NOTE — Progress Notes (Signed)
Patient ID: Nathaniel Chambers, male   DOB: [birth year], 48 y.o.   MRN: [id number] [name] 48 y.o. [birth year]   Subjective: Tolerating liquid diet. No complaints.  Objective: Vital signs in last 24 hours: Filed Vitals:   04/05/14 0508  BP: 124/67  Pulse: 60  Temp: 97.7 F (36.5 C)  Resp: 16    Physical Exam: Gen: alert, no acute distress Abd: epigastric and RLQ tenderness with guarding (greatest in RLQ), soft, obese, nondistended, +BS  Lab Results:  Recent Labs  04/03/14 1646 04/04/14 0510  NA 140 137  K 4.3 3.7  CL 101 100  CO2 24 26  GLUCOSE 83 93  BUN 7 7  CREATININE 1.12 1.20  CALCIUM 9.8 9.1    Recent Labs  04/03/14 1646 04/04/14 0510  AST 19 12  ALT 15 13  ALKPHOS 84 82  BILITOT 0.3 0.5  PROT 7.9 6.8  ALBUMIN 3.9 3.3*    Recent Labs  04/03/14 1646 04/04/14 0510  WBC 10.5 9.2  NEUTROABS 6.1  --   HGB 13.4 13.3  HCT 42.3 39.7  MCV 86.0 87.3  PLT 338 290   No results for input(s): LABPROT, INR in the last 72 hours.    Assessment/Plan: Diverticulitis with a small abscess - continue IV Abx, diet recs per surgery, surgery consult reviewed, supportive care. Will follow.   Nathaniel Chambers C. 04/05/2014, 11:42 AM

## 2014-04-05 NOTE — Progress Notes (Signed)
Patient ID: Nathaniel Chambers, male   DOB: 1965-08-14, 48 y.o.   MRN: [id number]    Subjective: Pt states he feels worse today with more abdominal pain diffusely.  No nausea or vomiting and no diarrhea  Objective: Vital signs in last 24 hours: Temp:  [97.7 F (36.5 C)-98.3 F (36.8 C)] 98.2 F (36.8 C) (12/10 1339) Pulse Rate:  [60-66] 65 (12/10 1339) Resp:  [16-18] 18 (12/10 1339) BP: (124-145)/(67-80) 126/80 mmHg (12/10 1339) SpO2:  [95 %-98 %] 95 % (12/10 1339) Last BM Date: 04/03/14  Intake/Output from previous day: 12/09 0701 - 12/10 0700 In: 240 [P.O.:240] Out: 2100 [Urine:2100] Intake/Output this shift: Total I/O In: 360 [P.O.:360] Out: 1550 [Urine:1550]  PE: Abd: soft, tender in RLQ, epigastrium, and minimally in LLQ, +BS, obese Heart: regular Lungs: CTAB  Lab Results:   Recent Labs  04/03/14 1646 04/04/14 0510  WBC 10.5 9.2  HGB 13.4 13.3  HCT 42.3 39.7  PLT 338 290   BMET  Recent Labs  04/03/14 1646 04/04/14 0510  NA 140 137  K 4.3 3.7  CL 101 100  CO2 24 26  GLUCOSE 83 93  BUN 7 7  CREATININE 1.12 1.20  CALCIUM 9.8 9.1   PT/INR No results for input(s): LABPROT, INR in the last 72 hours. CMP     Component Value Date/Time   NA 137 04/04/2014 0510   K 3.7 04/04/2014 0510   CL 100 04/04/2014 0510   CO2 26 04/04/2014 0510   GLUCOSE 93 04/04/2014 0510   BUN 7 04/04/2014 0510   CREATININE 1.20 04/04/2014 0510   CALCIUM 9.1 04/04/2014 0510   PROT 6.8 04/04/2014 0510   ALBUMIN 3.3* 04/04/2014 0510   AST 12 04/04/2014 0510   ALT 13 04/04/2014 0510   ALKPHOS 82 04/04/2014 0510   BILITOT 0.5 04/04/2014 0510   GFRNONAA 70* 04/04/2014 0510   GFRAA 81* 04/04/2014 0510   Lipase     Component Value Date/Time   LIPASE 36 04/03/2014 1646       Studies/Results: Ct Abdomen Pelvis W Contrast  04/03/2014   CLINICAL DATA:  Worsening generalized abdominal pain for 4 weeks. Sigmoid diverticulitis. Gastric lap band.  EXAM: CT ABDOMEN AND PELVIS WITH  CONTRAST  TECHNIQUE: Multidetector CT imaging of the abdomen and pelvis was performed using the standard protocol following bolus administration of intravenous contrast.  CONTRAST:  100 mL Omnipaque 300  COMPARISON:  12/15/2013  FINDINGS: Lower Chest:  Unremarkable.  Hepatobiliary: No masses or other significant abnormality identified. Gallbladder unremarkable.  Pancreas: No mass, inflammatory changes, or other parenchymal abnormality identified.  Spleen:  Within normal limits in size and appearance.  Adrenal Glands:  No mass identified.  Kidneys/Urinary Tract: No masses identified. No evidence of hydronephrosis.  Stomach/Bowel/Peritoneum: Gastric lap band remains in appropriate position. No evidence of bowel obstruction. Mild sigmoid diverticulitis shows no significant change compared to previous study. There is a more well-defined gas and fluid collection in the adjacent sigmoid mesocolon which measures 2 x 3 cm on image 69 of series 2. No other abscess or free fluid identified. Normal appendix visualized.  Vascular/Lymphatic: No pathologically enlarged lymph nodes identified. No other significant abnormality identified.  Reproductive:  No mass or other significant abnormality identified.  Other: Small bilateral inguinal hernias containing only fat again demonstrated.  Musculoskeletal:  No suspicious bone lesions identified.  IMPRESSION: Persistent mild sigmoid diverticulitis, with more well-defined 2 x 3 cm diverticular abscess now seen in adjacent sigmoid mesocolon. No other complication identified.  Electronically Signed   By: Earle Gell M.D.   On: 04/03/2014 16:02    Anti-infectives: Anti-infectives    Start     Dose/Rate Route Frequency Ordered Stop   04/04/14 0600  ciprofloxacin (CIPRO) IVPB 400 mg     400 mg200 mL/hr over 60 Minutes Intravenous Every 12 hours 04/03/14 1905     04/03/14 2100  metroNIDAZOLE (FLAGYL) IVPB 500 mg     500 mg100 mL/hr over 60 Minutes Intravenous 3 times per day  04/03/14 2004     04/03/14 1815  ciprofloxacin (CIPRO) IVPB 400 mg     400 mg200 mL/hr over 60 Minutes Intravenous  Once 04/03/14 1803 04/03/14 1925   04/03/14 1815  metroNIDAZOLE (FLAGYL) IVPB 500 mg  Status:  Discontinued     500 mg100 mL/hr over 60 Minutes Intravenous  Once 04/03/14 1803 04/03/14 2012       Assessment/Plan  1. Recurrent Diverticulitis with 2 x 3 cm abscess 2. Hx of asthma 3. Body mass index is 42.6 with prior gastric banding - Dr. Johnathan Hausen 4. Desmoid fibromatosis 5. Meralgia paraesthetica  Plan: 1. Will leave on clear liquids given worsening pain today.  Yesterday his WBC was normal and he is afebrile.  We will recheck lab work in the morning to make sure this hasn't elevated.  Cont Cipro and Flagyl for now.  If he becomes febrile or spikes a WBC, this may need to be changed.  We will follow.   LOS: 2 days    Dragon Thrush E 04/05/2014, 2:15 PM Pager: [phone]

## 2014-04-06 LAB — BASIC METABOLIC PANEL
Anion gap: 10 (ref 5–15)
BUN: 6 mg/dL (ref 6–23)
CO2: 25 meq/L (ref 19–32)
Calcium: 9.2 mg/dL (ref 8.4–10.5)
Chloride: 102 mEq/L (ref 96–112)
Creatinine, Ser: 1.22 mg/dL (ref 0.50–1.35)
GFR calc Af Amer: 79 mL/min — ABNORMAL LOW (ref >90)
GFR calc non Af Amer: 69 mL/min — ABNORMAL LOW (ref >90)
GLUCOSE: 99 mg/dL (ref 70–99)
POTASSIUM: 3.9 meq/L (ref 3.7–5.3)
SODIUM: 137 meq/L (ref 137–147)

## 2014-04-06 LAB — CBC
HCT: 41.4 % (ref 39.0–52.0)
HEMOGLOBIN: 13.4 g/dL (ref 13.0–17.0)
MCH: 27.7 pg (ref 26.0–34.0)
MCHC: 32.4 g/dL (ref 30.0–36.0)
MCV: 85.5 fL (ref 78.0–100.0)
Platelets: 313 10*3/uL (ref 150–400)
RBC: 4.84 MIL/uL (ref 4.22–5.81)
RDW: 14 % (ref 11.5–15.5)
WBC: 8.5 10*3/uL (ref 4.0–10.5)

## 2014-04-06 LAB — GLUCOSE, CAPILLARY: GLUCOSE-CAPILLARY: 96 mg/dL (ref 70–99)

## 2014-04-06 NOTE — Progress Notes (Signed)
[facility] PROGRESS NOTE  SELSO MANNOR HYI:[id number] DOB: Sep 16, 1965 DOA: 04/03/2014 PCP: [name], MD   brief narrative 48 y/o obese male with 2 episodes of sigmoid diverticulitis since this year, last admitted in august and treated with antibiotics, s/p colonoscopy after discharge by Dr Michail Sermon , asthma, fatty liver and  dermatofibroma of left thigh and hip ( excised in march 2015 at [facility]). Patient presented to the ED  for abdominal pain for past 4 weeks similar to acute diverticulitis in past. He called Dr Kathline Magic office as his symptoms did not improve and was sent for CT of the abdomen which showed acute sigmoid diverticulitis with 2x3 cm abscess. Patient admitted for further management on empiric abx.  Gen. surgery was also consulted and recommended medical management, will likely need semielective resection at the later date.  Assessment/Plan: Acute recurrent sigmoid diverticulitis with sigmoid abscess: Third episode this year - Continue IV ciprofloxacin and Flagyl, still has abdominal pain - Diet was advanced to soft solids today am, patient felt he will try, so far holding it down, will recheck after lunch - Surgery and GI following. Per surgery, continue current medical management, will likely need semielective resection at a later date, barium enema in 6 weeks if symptoms resolved.  - GI following as well, and Dr. Michail Sermon recommended changing antibiotics if worsening leukocytosis or abdominal pain or needs any surgical management  Asthma  stable, continue homer inhaler  obesity -Counseled on diet and weight control,patient reports exercising more regularly  DVT prophylaxis: sq heparin  Code Status: full code  Family Communication: none at bedside  Disposition Plan: Hopefully DC next 24-48 hours   Consultants:  [facility]  Gen. surgery  Procedures:  none  Antibiotics:  IV cipro and flagyl since 12/8--  Subjective: Patient feels that overall  he is improving, wants to try soft solids today, however still has abdominal pain, willing to downgrade to full liquids if nausea, vomiting, abdominal pain worsens   Objective: Filed Vitals:   04/06/14 0629  BP: 134/80  Pulse: 65  Temp: 98.1 F (36.7 C)  Resp: 18    Intake/Output Summary (Last 24 hours) at 04/06/14 1153 Last data filed at 04/06/14 7867  Gross per 24 hour  Intake   1367 ml  Output   1100 ml  Net    267 ml   Filed Weights   04/03/14 1956  Weight: 150.64 kg (332 lb 1.6 oz)    Exam:   Gen.: Alert and oriented 3, NAD  CVS: S1 and S2 clear  Chest: Clear to auscultation bilaterally  Abd: soft, ND, tenderness over left lower quadrant, pain and umbilical area, NBS  Ext: no c/c/ e  Data Reviewed: Basic Metabolic Panel:  Recent Labs Lab 04/03/14 1646 04/04/14 0510 04/06/14 0410  NA 140 137 137  K 4.3 3.7 3.9  CL 101 100 102  CO2 24 26 25   GLUCOSE 83 93 99  BUN 7 7 6   CREATININE 1.12 1.20 1.22  CALCIUM 9.8 9.1 9.2   Liver Function Tests:  Recent Labs Lab 04/03/14 1646 04/04/14 0510  AST 19 12  ALT 15 13  ALKPHOS 84 82  BILITOT 0.3 0.5  PROT 7.9 6.8  ALBUMIN 3.9 3.3*    Recent Labs Lab 04/03/14 1646  LIPASE 36   No results for input(s): AMMONIA in the last 168 hours. CBC:  Recent Labs Lab 04/03/14 1646 04/04/14 0510 04/06/14 0410  WBC 10.5 9.2 8.5  NEUTROABS 6.1  --   --  HGB 13.4 13.3 13.4  HCT 42.3 39.7 41.4  MCV 86.0 87.3 85.5  PLT 338 290 313   Cardiac Enzymes: No results for input(s): CKTOTAL, CKMB, CKMBINDEX, TROPONINI in the last 168 hours. BNP (last 3 results) No results for input(s): PROBNP in the last 8760 hours. CBG:  Recent Labs Lab 04/04/14 0711 04/05/14 0736 04/06/14 0739  GLUCAP 87 88 96    No results found for this or any previous visit (from the past 240 hour(s)).   Studies: No results found.  Scheduled Meds: . ciprofloxacin  400 mg Intravenous Q12H  . Fluticasone-Salmeterol  1 puff  Inhalation BID  . folic acid  1 mg Oral Daily  . heparin  5,000 Units Subcutaneous 3 times per day  . metronidazole  500 mg Intravenous 3 times per day  . montelukast  10 mg Oral QHS  . multivitamin with minerals  1 tablet Oral Daily  . polyethylene glycol  17 g Oral Daily  . thiamine  100 mg Oral Daily   Continuous Infusions: . sodium chloride 75 mL/hr at 04/05/14 2032      Time spent: 25 minutes    RAI,RIPUDEEP M.D. [facility] 04/06/2014, 11:53 AM  Pager: [phone]

## 2014-04-06 NOTE — Progress Notes (Signed)
Patient ID: Nathaniel Chambers, male   DOB: 1965-12-04, 48 y.o.   MRN: [id number]    Subjective: Pt still complaining of 8-9/10 abdominal pain, but is eating a solid diet.    Objective: Vital signs in last 24 hours: Temp:  [98.1 F (36.7 C)-98.7 F (37.1 C)] 98.1 F (36.7 C) (12/11 0629) Pulse Rate:  [65-72] 65 (12/11 0629) Resp:  [18] 18 (12/11 0629) BP: (126-142)/(80) 134/80 mmHg (12/11 0629) SpO2:  [95 %-99 %] 96 % (12/11 0629) Last BM Date: 04/03/14  Intake/Output from previous day: 12/10 0701 - 12/11 0700 In: 1727 [P.O.:360; I.V.:967; IV Piggyback:400] Out: 2650 [Urine:2650] Intake/Output this shift:    PE: Abd: soft, but tender in RLQ, RUP/epigastrium, with minimal LLQ pain, +BS  Lab Results:   Recent Labs  04/04/14 0510 04/06/14 0410  WBC 9.2 8.5  HGB 13.3 13.4  HCT 39.7 41.4  PLT 290 313   BMET  Recent Labs  04/04/14 0510 04/06/14 0410  NA 137 137  K 3.7 3.9  CL 100 102  CO2 26 25  GLUCOSE 93 99  BUN 7 6  CREATININE 1.20 1.22  CALCIUM 9.1 9.2   PT/INR No results for input(s): LABPROT, INR in the last 72 hours. CMP     Component Value Date/Time   NA 137 04/06/2014 0410   K 3.9 04/06/2014 0410   CL 102 04/06/2014 0410   CO2 25 04/06/2014 0410   GLUCOSE 99 04/06/2014 0410   BUN 6 04/06/2014 0410   CREATININE 1.22 04/06/2014 0410   CALCIUM 9.2 04/06/2014 0410   PROT 6.8 04/04/2014 0510   ALBUMIN 3.3* 04/04/2014 0510   AST 12 04/04/2014 0510   ALT 13 04/04/2014 0510   ALKPHOS 82 04/04/2014 0510   BILITOT 0.5 04/04/2014 0510   GFRNONAA 69* 04/06/2014 0410   GFRAA 79* 04/06/2014 0410   Lipase     Component Value Date/Time   LIPASE 36 04/03/2014 1646       Studies/Results: No results found.  Anti-infectives: Anti-infectives    Start     Dose/Rate Route Frequency Ordered Stop   04/04/14 0600  ciprofloxacin (CIPRO) IVPB 400 mg     400 mg200 mL/hr over 60 Minutes Intravenous Every 12 hours 04/03/14 1905     04/03/14 2100  metroNIDAZOLE  (FLAGYL) IVPB 500 mg     500 mg100 mL/hr over 60 Minutes Intravenous 3 times per day 04/03/14 2004     04/03/14 1815  ciprofloxacin (CIPRO) IVPB 400 mg     400 mg200 mL/hr over 60 Minutes Intravenous  Once 04/03/14 1803 04/03/14 1925   04/03/14 1815  metroNIDAZOLE (FLAGYL) IVPB 500 mg  Status:  Discontinued     500 mg100 mL/hr over 60 Minutes Intravenous  Once 04/03/14 1803 04/03/14 2012       Assessment/Plan  1. Diverticulitis with abscess, episode 4  Plan: 1. We would recommend clear liquid diet be maintained until the patient's abdominal pain is improved and almost resolved.  He remains afebrile and WBC remains normal.  He may need a repeat CT scan over the weekend to make sure his abscess is not enlarging or his inflammation worsening. 2. Cont IV abx therapy.   LOS: 3 days    Theodore Rahrig E 04/06/2014, 1:00 PM Pager: [phone]

## 2014-04-06 NOTE — Progress Notes (Signed)
Patient ID: Nathaniel Chambers, male   DOB: 04-10-1966, 48 y.o.   MRN: [id number] [facility] Gastroenterology Progress Note  VERDUN Chambers 48 y.o. [birth year]   Subjective: Reports having a bad night due to abdominal pain. Denies N/V. Tolerating solid food.  Objective: Vital signs: Filed Vitals:   04/06/14 0629  BP: 134/80  Pulse: 65  Temp: 98.1 F (36.7 C)  Resp: 18    Physical Exam: Gen: alert, no acute distress  Abd: diffuse tenderness (palpation in RLQ causes tenderness in the epigastric region), soft, nondistended, +BS  Lab Results:  Recent Labs  04/04/14 0510 04/06/14 0410  NA 137 137  K 3.7 3.9  CL 100 102  CO2 26 25  GLUCOSE 93 99  BUN 7 6  CREATININE 1.20 1.22  CALCIUM 9.1 9.2    Recent Labs  04/03/14 1646 04/04/14 0510  AST 19 12  ALT 15 13  ALKPHOS 84 82  BILITOT 0.3 0.5  PROT 7.9 6.8  ALBUMIN 3.9 3.3*    Recent Labs  04/03/14 1646 04/04/14 0510 04/06/14 0410  WBC 10.5 9.2 8.5  NEUTROABS 6.1  --   --   HGB 13.4 13.3 13.4  HCT 42.3 39.7 41.4  MCV 86.0 87.3 85.5  PLT 338 290 313      Assessment/Plan: Sigmoid Diverticulitis with an associated abscess - no significant improvement as of yet. WBC normal. Continue current IV Abx but may need to consider changing if fevers develop or if pain worsens vs. Surgical management. Continue supportive care. Dr. Cristina Gong to see tomorrow.   Nathaniel C. 04/06/2014, 11:05 AM

## 2014-04-07 DIAGNOSIS — E669 Obesity, unspecified: Secondary | ICD-10-CM

## 2014-04-07 LAB — GLUCOSE, CAPILLARY: GLUCOSE-CAPILLARY: 115 mg/dL — AB (ref 70–99)

## 2014-04-07 MED ORDER — SACCHAROMYCES BOULARDII 250 MG PO CAPS
250.0000 mg | ORAL_CAPSULE | Freq: Two times a day (BID) | ORAL | Status: DC
  Administered 2014-04-07 – 2014-04-10 (×6): 250 mg via ORAL
  Filled 2014-04-07 (×7): qty 1

## 2014-04-07 MED ORDER — PANTOPRAZOLE SODIUM 40 MG IV SOLR
40.0000 mg | Freq: Every day | INTRAVENOUS | Status: DC
  Administered 2014-04-07 – 2014-04-10 (×4): 40 mg via INTRAVENOUS
  Filled 2014-04-07 (×4): qty 40

## 2014-04-07 NOTE — Progress Notes (Signed)
PATIENT DETAILS Name: Nathaniel Chambers Age: 48 y.o. Sex: male Date of Birth: 10/25/1965 Admit Date: 04/03/2014 Admitting Physician Allyne Gee, MD HCW:CBJSE,GBTDV RUTH, MD  Brief Narrative: Nathaniel Chambers is a 48 y.o. male is admitted for sigmoid diverticulitis and an abscess (2 X 3 cm) noted on a CT scan on 12/8.He had an episode of diverticulitis in May 2015 and in August 2015 both noted on CT scan and was treated with oral Abx as outpt in May and IV Abx as inpt in August. Colonoscopy in July 2015 showed sigmoid diverticulosis, small internal hemorrhoids, and 3 small adenomas were noted and removed.  Subjective: Abd pain worse this am-after eating.  Assessment/Plan: Principal Problem:   Sigmoid diverticulitis with abscess:Admitted started on IV Cipro/Flagyl. Diet advanced to soft diet -following which pain worsened. Diet now back to clear liquids, CCS recommending repeat CT Abd in am. Eventually will need elective colectomy (as outpatient) giving recurrent nature of the disease.Continue IV Abx, supportive care, follow clinical course  Active Problems: Asthma stable, continue home inhaler  obesity -Counseled on diet and weight control,patient reports exercising more regularly  Disposition: Remain inpatient  Antibiotics:  See below.   Anti-infectives    Start     Dose/Rate Route Frequency Ordered Stop   04/04/14 0600  ciprofloxacin (CIPRO) IVPB 400 mg     400 mg200 mL/hr over 60 Minutes Intravenous Every 12 hours 04/03/14 1905     04/03/14 2100  metroNIDAZOLE (FLAGYL) IVPB 500 mg     500 mg100 mL/hr over 60 Minutes Intravenous 3 times per day 04/03/14 2004     04/03/14 1815  ciprofloxacin (CIPRO) IVPB 400 mg     400 mg200 mL/hr over 60 Minutes Intravenous  Once 04/03/14 1803 04/03/14 1925   04/03/14 1815  metroNIDAZOLE (FLAGYL) IVPB 500 mg  Status:  Discontinued     500 mg100 mL/hr over 60 Minutes Intravenous  Once 04/03/14 1803 04/03/14 2012      DVT  Prophylaxis: Prophylactic Heparin  Code Status: Full code   Family Communication None at bedside  Procedures:  None  CONSULTS:  GI and general surgery   MEDICATIONS: Scheduled Meds: . ciprofloxacin  400 mg Intravenous Q12H  . Fluticasone-Salmeterol  1 puff Inhalation BID  . folic acid  1 mg Oral Daily  . heparin  5,000 Units Subcutaneous 3 times per day  . metronidazole  500 mg Intravenous 3 times per day  . montelukast  10 mg Oral QHS  . multivitamin with minerals  1 tablet Oral Daily  . pantoprazole (PROTONIX) IV  40 mg Intravenous Daily  . thiamine  100 mg Oral Daily   Continuous Infusions: . sodium chloride 75 mL/hr at 04/07/14 0939   PRN Meds:.albuterol, morphine, morphine injection, promethazine    PHYSICAL EXAM: Vital signs in last 24 hours: Filed Vitals:   04/06/14 0629 04/06/14 1300 04/06/14 2142 04/07/14 0542  BP: 134/80 144/83 144/82 131/73  Pulse: 65 73 71 68  Temp: 98.1 F (36.7 C) 98.2 F (36.8 C) 98.9 F (37.2 C) 97.9 F (36.6 C)  TempSrc: Oral Oral Oral Oral  Resp: 18 16 16 16   Height:      Weight:      SpO2: 96% 98% 95% 97%    Weight change:  Filed Weights   04/03/14 1956  Weight: 150.64 kg (332 lb 1.6 oz)   Body mass index is 42.62 kg/(m^2).   Gen Exam: Awake and alert with clear  speech.   Neck: Supple, No JVD.   Chest: B/L Clear.   CVS: S1 S2 Regular, no murmurs.  Abdomen: soft, BS +, mildly tender in LLQ area, non distended.  Extremities: no edema, lower extremities warm to touch. Neurologic: Non Focal.   Skin: No Rash.   Wounds: N/A.   Intake/Output from previous day:  Intake/Output Summary (Last 24 hours) at 04/07/14 1327 Last data filed at 04/07/14 0510  Gross per 24 hour  Intake   1571 ml  Output      0 ml  Net   1571 ml     LAB RESULTS: CBC  Recent Labs Lab 04/03/14 1646 04/04/14 0510 04/06/14 0410  WBC 10.5 9.2 8.5  HGB 13.4 13.3 13.4  HCT 42.3 39.7 41.4  PLT 338 290 313  MCV 86.0 87.3 85.5  MCH  27.2 29.2 27.7  MCHC 31.7 33.5 32.4  RDW 14.1 14.1 14.0  LYMPHSABS 3.6  --   --   MONOABS 0.6  --   --   EOSABS 0.2  --   --   BASOSABS 0.0  --   --     Chemistries   Recent Labs Lab 04/03/14 1646 04/04/14 0510 04/06/14 0410  NA 140 137 137  K 4.3 3.7 3.9  CL 101 100 102  CO2 24 26 25   GLUCOSE 83 93 99  BUN 7 7 6   CREATININE 1.12 1.20 1.22  CALCIUM 9.8 9.1 9.2    CBG:  Recent Labs Lab 04/04/14 0711 04/05/14 0736 04/06/14 0739 04/07/14 0755  GLUCAP 87 88 96 115*    GFR Estimated Creatinine Clearance: 114.8 mL/min (by C-G formula based on Cr of 1.22).  Coagulation profile No results for input(s): INR, PROTIME in the last 168 hours.  Cardiac Enzymes No results for input(s): CKMB, TROPONINI, MYOGLOBIN in the last 168 hours.  Invalid input(s): CK  Invalid input(s): POCBNP No results for input(s): DDIMER in the last 72 hours. No results for input(s): HGBA1C in the last 72 hours. No results for input(s): CHOL, HDL, LDLCALC, TRIG, CHOLHDL, LDLDIRECT in the last 72 hours. No results for input(s): TSH, T4TOTAL, T3FREE, THYROIDAB in the last 72 hours.  Invalid input(s): FREET3 No results for input(s): VITAMINB12, FOLATE, FERRITIN, TIBC, IRON, RETICCTPCT in the last 72 hours. No results for input(s): LIPASE, AMYLASE in the last 72 hours.  Urine Studies No results for input(s): UHGB, CRYS in the last 72 hours.  Invalid input(s): UACOL, UAPR, USPG, UPH, UTP, UGL, UKET, UBIL, UNIT, UROB, ULEU, UEPI, UWBC, URBC, UBAC, CAST, UCOM, BILUA  MICROBIOLOGY: No results found for this or any previous visit (from the past 240 hour(s)).  RADIOLOGY STUDIES/RESULTS: Ct Abdomen Pelvis W Contrast  04/03/2014   CLINICAL DATA:  Worsening generalized abdominal pain for 4 weeks. Sigmoid diverticulitis. Gastric lap band.  EXAM: CT ABDOMEN AND PELVIS WITH CONTRAST  TECHNIQUE: Multidetector CT imaging of the abdomen and pelvis was performed using the standard protocol following bolus  administration of intravenous contrast.  CONTRAST:  100 mL Omnipaque 300  COMPARISON:  12/15/2013  FINDINGS: Lower Chest:  Unremarkable.  Hepatobiliary: No masses or other significant abnormality identified. Gallbladder unremarkable.  Pancreas: No mass, inflammatory changes, or other parenchymal abnormality identified.  Spleen:  Within normal limits in size and appearance.  Adrenal Glands:  No mass identified.  Kidneys/Urinary Tract: No masses identified. No evidence of hydronephrosis.  Stomach/Bowel/Peritoneum: Gastric lap band remains in appropriate position. No evidence of bowel obstruction. Mild sigmoid diverticulitis shows no significant change compared  to previous study. There is a more well-defined gas and fluid collection in the adjacent sigmoid mesocolon which measures 2 x 3 cm on image 69 of series 2. No other abscess or free fluid identified. Normal appendix visualized.  Vascular/Lymphatic: No pathologically enlarged lymph nodes identified. No other significant abnormality identified.  Reproductive:  No mass or other significant abnormality identified.  Other: Small bilateral inguinal hernias containing only fat again demonstrated.  Musculoskeletal:  No suspicious bone lesions identified.  IMPRESSION: Persistent mild sigmoid diverticulitis, with more well-defined 2 x 3 cm diverticular abscess now seen in adjacent sigmoid mesocolon. No other complication identified.   Electronically Signed   By: Earle Gell M.D.   On: 04/03/2014 16:02    Oren Binet, MD  [facility] Pager:[phone]  If 7PM-7AM, please contact night-coverage [email or web address] Password [email or web address] 04/07/2014, 1:27 PM   LOS: 4 days

## 2014-04-07 NOTE — Progress Notes (Signed)
GASTROENTEROLOGY PROGRESS NOTE  Problem:   Recurrent diverticulitis. Small diverticular abscess.  Subjective: Significant increase in pain this morning after eating breakfast. He had had solid food last night for dinner without difficulty. Feels somewhat better this evening, after having some bowel movements.  Objective: Abdomen is without significant tenderness. He remains afebrile. White count yesterday was normal, but was not rechecked today.  Assessment: It remains unclear whether the patient's increase in pain was due to colonic stimulation from ingestion of solid food, or actually reflected some form of worsening of his diverticulitis, which might or might not be related to feeding.  Plan: 1. Agree with current management, specifically, clear liquid diet and plan for repeat CT scan 2. I will add Saccharomyces on probiotic to help prophylax against C. difficile infection while on antibiotics in the hospital 3. The patient is currently being followed by surgery and I do not feel we have much to add to his management, so I will sign off at this time, but we would be happy to see this patient again if needed.  Cleotis Nipper, M.D. 04/07/2014 7:51 PM

## 2014-04-07 NOTE — Progress Notes (Signed)
  Subjective: Patient was given a soft diet this morning - still having epigastric and LLQ abdominal pain - worse after eating. Diarrhea this AM  Objective: Vital signs in last 24 hours: Temp:  [97.9 F (36.6 C)-98.9 F (37.2 C)] 97.9 F (36.6 C) (12/12 0542) Pulse Rate:  [68-73] 68 (12/12 0542) Resp:  [16] 16 (12/12 0542) BP: (131-144)/(73-83) 131/73 mmHg (12/12 0542) SpO2:  [95 %-98 %] 97 % (12/12 0542) Last BM Date: 04/03/14  Intake/Output from previous day: 12/11 0701 - 12/12 0700 In: 5670 [P.O.:240; I.V.:1131; IV Piggyback:200] Out: -  Intake/Output this shift:    General appearance: alert, cooperative and no distress GI: obese, soft, hypoactive bowel sounds, mild epigastric tenderness; moderate LLQ tenderness  Lab Results:   Recent Labs  04/06/14 0410  WBC 8.5  HGB 13.4  HCT 41.4  PLT 313   BMET  Recent Labs  04/06/14 0410  NA 137  K 3.9  CL 102  CO2 25  GLUCOSE 99  BUN 6  CREATININE 1.22  CALCIUM 9.2   PT/INR No results for input(s): LABPROT, INR in the last 72 hours. ABG No results for input(s): PHART, HCO3 in the last 72 hours.  Invalid input(s): PCO2, PO2  Studies/Results: No results found.  Anti-infectives: Anti-infectives    Start     Dose/Rate Route Frequency Ordered Stop   04/04/14 0600  ciprofloxacin (CIPRO) IVPB 400 mg     400 mg200 mL/hr over 60 Minutes Intravenous Every 12 hours 04/03/14 1905     04/03/14 2100  metroNIDAZOLE (FLAGYL) IVPB 500 mg     500 mg100 mL/hr over 60 Minutes Intravenous 3 times per day 04/03/14 2004     04/03/14 1815  ciprofloxacin (CIPRO) IVPB 400 mg     400 mg200 mL/hr over 60 Minutes Intravenous  Once 04/03/14 1803 04/03/14 1925   04/03/14 1815  metroNIDAZOLE (FLAGYL) IVPB 500 mg  Status:  Discontinued     500 mg100 mL/hr over 60 Minutes Intravenous  Once 04/03/14 1803 04/03/14 2012      Assessment/Plan: s/p * No surgery found * Sigmoid diverticulitis with abscess  Probable gastritis  CLEAR  LIQUIDS - DO NOT ADVANCE DIET WITHOUT TALKING TO SURGERY Repeat CT scan in AM Protonix   LOS: 4 days    Rise Traeger K. 04/07/2014

## 2014-04-08 ENCOUNTER — Inpatient Hospital Stay (HOSPITAL_COMMUNITY): Payer: BC Managed Care – PPO

## 2014-04-08 ENCOUNTER — Encounter (HOSPITAL_COMMUNITY): Payer: Self-pay | Admitting: Radiology

## 2014-04-08 LAB — CBC
HCT: 43.9 % (ref 39.0–52.0)
HEMATOCRIT: 43 % (ref 39.0–52.0)
Hemoglobin: 14.1 g/dL (ref 13.0–17.0)
Hemoglobin: 14.3 g/dL (ref 13.0–17.0)
MCH: 27.9 pg (ref 26.0–34.0)
MCH: 28.8 pg (ref 26.0–34.0)
MCHC: 32.1 g/dL (ref 30.0–36.0)
MCHC: 33.3 g/dL (ref 30.0–36.0)
MCV: 86.9 fL (ref 78.0–100.0)
MCV: 86.5 fL (ref 78.0–100.0)
PLATELETS: 298 10*3/uL (ref 150–400)
Platelets: 305 10*3/uL (ref 150–400)
RBC: 5.05 MIL/uL (ref 4.22–5.81)
RBC: 4.97 MIL/uL (ref 4.22–5.81)
RDW: 14.3 % (ref 11.5–15.5)
RDW: 14.3 % (ref 11.5–15.5)
WBC: 10.4 10*3/uL (ref 4.0–10.5)
WBC: 9.3 10*3/uL (ref 4.0–10.5)

## 2014-04-08 LAB — BASIC METABOLIC PANEL
ANION GAP: 10 (ref 5–15)
BUN: 7 mg/dL (ref 6–23)
CHLORIDE: 104 meq/L (ref 96–112)
CO2: 24 mEq/L (ref 19–32)
Calcium: 9.4 mg/dL (ref 8.4–10.5)
Creatinine, Ser: 1.15 mg/dL (ref 0.50–1.35)
GFR calc non Af Amer: 74 mL/min — ABNORMAL LOW (ref >90)
GFR, EST AFRICAN AMERICAN: 85 mL/min — AB (ref >90)
Glucose, Bld: 103 mg/dL — ABNORMAL HIGH (ref 70–99)
POTASSIUM: 4.2 meq/L (ref 3.7–5.3)
SODIUM: 138 meq/L (ref 137–147)

## 2014-04-08 LAB — GLUCOSE, CAPILLARY: Glucose-Capillary: 93 mg/dL (ref 70–99)

## 2014-04-08 MED ORDER — IOHEXOL 300 MG/ML  SOLN
100.0000 mL | Freq: Once | INTRAMUSCULAR | Status: AC | PRN
  Administered 2014-04-08: 100 mL via INTRAVENOUS

## 2014-04-08 MED ORDER — IOHEXOL 300 MG/ML  SOLN
50.0000 mL | Freq: Once | INTRAMUSCULAR | Status: AC | PRN
  Administered 2014-04-08: 50 mL via ORAL

## 2014-04-08 NOTE — Progress Notes (Signed)
Patient ID: Nathaniel Chambers, male   DOB: 06/16/1965, 48 y.o.   MRN: [id number]  General Surgery - [facility] - Progress Note  Subjective: Patient seated at bedside.  Just back from CT scan this AM.  Mild diffuse abdominal pain.  No emesis.  Objective: Vital signs in last 24 hours: Temp:  [98.2 F (36.8 C)-99.1 F (37.3 C)] 98.2 F (36.8 C) (12/13 0500) Pulse Rate:  [69-73] 69 (12/13 0500) Resp:  [16-18] 16 (12/13 0500) BP: (135-147)/(64-84) 135/64 mmHg (12/13 0500) SpO2:  [97 %-99 %] 97 % (12/13 0500) Last BM Date: 04/07/14  Intake/Output from previous day:    Exam: HEENT - clear, not icteric Neck - soft Abd - soft, mild diffuse tenderness; no guarding; BS present Ext - no significant edema Neuro - grossly intact, no focal deficits  Lab Results:   Recent Labs  04/06/14 0410 04/08/14 0535  WBC 8.5 10.4  HGB 13.4 14.1  HCT 41.4 43.9  PLT 313 305     Recent Labs  04/06/14 0410 04/08/14 0535  NA 137 138  K 3.9 4.2  CL 102 104  CO2 25 24  GLUCOSE 99 103*  BUN 6 7  CREATININE 1.22 1.15  CALCIUM 9.2 9.4    Studies/Results: No results found.  Assessment / Plan: 1.  Acute diverticulitis with small abscess  CT scan results this AM pending  Cipro, Flagyl IV  WBC stable at 10K  Afebrile  Will review CT scan this AM.  If improving, allow soft low residue diet.  Discussed with Dr. Cruzita Lederer.  Earnstine Regal, MD, [facility] Office: [phone]  04/08/2014

## 2014-04-08 NOTE — Progress Notes (Signed)
PROGRESS NOTE   Nathaniel Chambers:[id number] DOB: 1965-12-26 DOA: 04/03/2014 PCP: [name], MD  HPI: Nathaniel Chambers is a 48 y.o. male is admitted for sigmoid diverticulitis and an abscess (2 X 3 cm) noted on a CT scan on 12/8.He had an episode of diverticulitis in May 2015 and in August 2015 both noted on CT scan and was treated with oral Abx as outpt in May and IV Abx as inpt in August. Colonoscopy in July 2015 showed sigmoid diverticulosis, small internal hemorrhoids, and 3 small adenomas were noted and removed.  Subjective / 24 H Interval events Minimal abdominal pain  Assessment/Plan: Principal Problem:   Sigmoid diverticulitis Active Problems:   Diverticulitis   Asthma   Colonic diverticular abscess   Sigmoid diverticulitis with abscess:Admitted started on IV Cipro/Flagyl. Diet advanced to soft diet -following which pain worsened, back to clears. Patient wants to eat more today - CT scan stable, no worsening features - try soft diet again today   Asthma -stable, continue home inhaler  Obesity - Counseled on diet and weight control,patient reports exercising more regularly   Diet: Diet clear liquid Fluids: NS DVT Prophylaxis: heparin  Code Status: Full Code Family Communication: none  Disposition Plan: remain inpatient  Consultants:  GI - signed off 12/12  General surgery   Procedures:  None    Antibiotics Ciprofloxacin 12/8 >> Metronidazole 12/8 >>   Studies  No results found.   Objective  Filed Vitals:   04/06/14 2142 04/07/14 0542 04/07/14 2100 04/08/14 0500  BP: 144/82 131/73 147/84 135/64  Pulse: 71 68 73 69  Temp: 98.9 F (37.2 C) 97.9 F (36.6 C) 99.1 F (37.3 C) 98.2 F (36.8 C)  TempSrc: Oral Oral Oral Oral  Resp: 16 16 18 16   Height:      Weight:      SpO2: 95% 97% 99% 97%   No intake or output data in the 24 hours ending 04/08/14 1244 Filed Weights   04/03/14 1956  Weight: 150.64 kg (332 lb 1.6 oz)     Exam:  General:  NAD  Cardiovascular: RRR  Respiratory: CTA biL  Abdomen: soft, mild tenderness throughout  MSK: no edema  Neuro: non focal  Data Reviewed: Basic Metabolic Panel:  Recent Labs Lab 04/03/14 1646 04/04/14 0510 04/06/14 0410 04/08/14 0535  NA 140 137 137 138  K 4.3 3.7 3.9 4.2  CL 101 100 102 104  CO2 24 26 25 24   GLUCOSE 83 93 99 103*  BUN 7 7 6 7   CREATININE 1.12 1.20 1.22 1.15  CALCIUM 9.8 9.1 9.2 9.4   Liver Function Tests:  Recent Labs Lab 04/03/14 1646 04/04/14 0510  AST 19 12  ALT 15 13  ALKPHOS 84 82  BILITOT 0.3 0.5  PROT 7.9 6.8  ALBUMIN 3.9 3.3*    Recent Labs Lab 04/03/14 1646  LIPASE 36   CBC:  Recent Labs Lab 04/03/14 1646 04/04/14 0510 04/06/14 0410 04/08/14 0535 04/08/14 1208  WBC 10.5 9.2 8.5 10.4 9.3  NEUTROABS 6.1  --   --   --   --   HGB 13.4 13.3 13.4 14.1 14.3  HCT 42.3 39.7 41.4 43.9 43.0  MCV 86.0 87.3 85.5 86.9 86.5  PLT 338 290 313 305 298   CBG:  Recent Labs Lab 04/04/14 0711 04/05/14 0736 04/06/14 0739 04/07/14 0755 04/08/14 0809  GLUCAP 87 88 96 115* 93    No results found for this or any previous visit (from  the past 240 hour(s)).   Scheduled Meds: . ciprofloxacin  400 mg Intravenous Q12H  . Fluticasone-Salmeterol  1 puff Inhalation BID  . folic acid  1 mg Oral Daily  . heparin  5,000 Units Subcutaneous 3 times per day  . metronidazole  500 mg Intravenous 3 times per day  . montelukast  10 mg Oral QHS  . multivitamin with minerals  1 tablet Oral Daily  . pantoprazole (PROTONIX) IV  40 mg Intravenous Daily  . saccharomyces boulardii  250 mg Oral BID  . thiamine  100 mg Oral Daily   Continuous Infusions: . sodium chloride 75 mL/hr at 04/07/14 3568    Time spent: 25 minutes  Marzetta Board, MD [facility] Pager [phone]. If 7 PM - 7 AM, please contact night-coverage at [email or web address], password [facility] 04/08/2014, 12:44 PM  LOS: 5 days

## 2014-04-09 LAB — GLUCOSE, CAPILLARY: GLUCOSE-CAPILLARY: 132 mg/dL — AB (ref 70–99)

## 2014-04-09 MED ORDER — SODIUM CHLORIDE 0.9 % IV SOLN
1.0000 g | INTRAVENOUS | Status: DC
  Administered 2014-04-09 – 2014-04-10 (×2): 1 g via INTRAVENOUS
  Filled 2014-04-09 (×2): qty 1

## 2014-04-09 MED ORDER — SODIUM CHLORIDE 0.9 % IJ SOLN
10.0000 mL | INTRAMUSCULAR | Status: DC | PRN
  Administered 2014-04-10: 10 mL
  Filled 2014-04-09: qty 40

## 2014-04-09 NOTE — Progress Notes (Signed)
Subjective: Has not had any pain yesterday or today.  On solid diet.  Objective: Vital signs in last 24 hours: Temp:  [97.9 F (36.6 C)-98.1 F (36.7 C)] 97.9 F (36.6 C) (12/14 0445) Pulse Rate:  [66-68] 68 (12/14 0445) Resp:  [18] 18 (12/14 0445) BP: (122-143)/(71-72) 143/71 mmHg (12/14 0445) SpO2:  [95 %-96 %] 95 % (12/14 0445) Last BM Date: 04/08/14  Intake/Output from previous day: 12/13 0701 - 12/14 0700 In: 2937.5 [I.V.:2537.5; IV Piggyback:400] Out: -  Intake/Output this shift:    PE: General- In NAD Abdomen-soft, mild LLQ tenderness  Lab Results:   Recent Labs  04/08/14 0535 04/08/14 1208  WBC 10.4 9.3  HGB 14.1 14.3  HCT 43.9 43.0  PLT 305 298   BMET  Recent Labs  04/08/14 0535  NA 138  K 4.2  CL 104  CO2 24  GLUCOSE 103*  BUN 7  CREATININE 1.15  CALCIUM 9.4   PT/INR No results for input(s): LABPROT, INR in the last 72 hours. Comprehensive Metabolic Panel:    Component Value Date/Time   NA 138 04/08/2014 0535   NA 137 04/06/2014 0410   K 4.2 04/08/2014 0535   K 3.9 04/06/2014 0410   CL 104 04/08/2014 0535   CL 102 04/06/2014 0410   CO2 24 04/08/2014 0535   CO2 25 04/06/2014 0410   BUN 7 04/08/2014 0535   BUN 6 04/06/2014 0410   CREATININE 1.15 04/08/2014 0535   CREATININE 1.22 04/06/2014 0410   GLUCOSE 103* 04/08/2014 0535   GLUCOSE 99 04/06/2014 0410   CALCIUM 9.4 04/08/2014 0535   CALCIUM 9.2 04/06/2014 0410   AST 12 04/04/2014 0510   AST 19 04/03/2014 1646   ALT 13 04/04/2014 0510   ALT 15 04/03/2014 1646   ALKPHOS 82 04/04/2014 0510   ALKPHOS 84 04/03/2014 1646   BILITOT 0.5 04/04/2014 0510   BILITOT 0.3 04/03/2014 1646   PROT 6.8 04/04/2014 0510   PROT 7.9 04/03/2014 1646   ALBUMIN 3.3* 04/04/2014 0510   ALBUMIN 3.9 04/03/2014 1646     Studies/Results: Ct Abdomen Pelvis W Contrast  04/08/2014   CLINICAL DATA:  Followup of sigmoid diverticulitis and diverticular abscess. Epigastric and left lower quadrant  abdominal pain and tenderness, worse after eating. Mild leukocytosis with [place] blood [place] count of 19147. Surgical history includes laparoscopic gastric banding and right inguinal hernia repair.  EXAM: CT ABDOMEN AND PELVIS WITH CONTRAST  TECHNIQUE: Multidetector CT imaging of the abdomen and pelvis was performed using the standard protocol following bolus administration of intravenous contrast.  CONTRAST:  183mL OMNIPAQUE IOHEXOL 300 MG/ML IV.  COMPARISON:  04/03/2014, 12/15/2013, 12/12/2013, 09/15/2013, 08/18/2010.  FINDINGS: Thick walled contained perforation/abscess involving the proximal sigmoid colon to the right of midline in the mid pelvis measuring approximately 3.1 x 2.1 x 3.4 cm, present since May, 2015, minimally increased in size since August, 2015, unchanged since the examination 3 days ago. Thickening of the wall of the proximal sigmoid colon with persistent mild edema/inflammation in the adjacent fat. No acute inflammatory changes elsewhere in the abdomen or pelvis.  Normal-appearing liver, spleen, pancreas, adrenal glands, kidneys, and gallbladder. No biliary ductal dilation. No visible aortoiliofemoral atherosclerosis. No significant lymphadenopathy.  Laparoscopic gastric band unchanged, shown on the scout image to be appropriately oriented. Subcutaneous port in the right upper anterior abdominal wall. Intact catheter between the port in the band. Normal-appearing stomach and small bowel. Normal appendix in the right upper pelvis, filled with oral contrast. No ascites.  Urinary bladder decompressed and unremarkable. Prostate gland and seminal vesicles unremarkable for age. Small bilateral inguinal hernias containing fat.  Bone window images unremarkable. Visualized lung bases clear. Heart size normal with borderline left ventricular enlargement.  IMPRESSION: 1. Thick walled contained perforation/abscess involving the proximal sigmoid colon, present since May, 2015, minimally increased in size  since August, 2015, unchanged since the examination 3 days ago, measured above. Minimal residual acute inflammation adjacent to the proximal sigmoid colon. 2. No new abnormalities elsewhere in the abdomen or pelvis. 3. Small bilateral inguinal hernias containing fat.   Electronically Signed   By: Evangeline Dakin M.D.   On: 04/08/2014 11:07    Anti-infectives: Anti-infectives    Start     Dose/Rate Route Frequency Ordered Stop   04/09/14 0830  ertapenem (INVANZ) 1 g in sodium chloride 0.9 % 50 mL IVPB     1 g100 mL/hr over 30 Minutes Intravenous Every 24 hours 04/09/14 0712     04/04/14 0600  ciprofloxacin (CIPRO) IVPB 400 mg  Status:  Discontinued     400 mg200 mL/hr over 60 Minutes Intravenous Every 12 hours 04/03/14 1905 04/09/14 0712   04/03/14 2100  metroNIDAZOLE (FLAGYL) IVPB 500 mg  Status:  Discontinued     500 mg100 mL/hr over 60 Minutes Intravenous 3 times per day 04/03/14 2004 04/09/14 0712   04/03/14 1815  ciprofloxacin (CIPRO) IVPB 400 mg     400 mg200 mL/hr over 60 Minutes Intravenous  Once 04/03/14 1803 04/03/14 1925   04/03/14 1815  metroNIDAZOLE (FLAGYL) IVPB 500 mg  Status:  Discontinued     500 mg100 mL/hr over 60 Minutes Intravenous  Once 04/03/14 1803 04/03/14 2012      Assessment Principal Problem:   Sigmoid diverticulitis with chronic peridiverticular abscess-no indication or urgent surgery at this time; he eventually will need an elective resection and we discussed this.     LOS: 6 days   Plan: Change to IV InVanz and if tolerated he could go home of this and have repeat CT as an outpatient in one week.  Will have him follow up with Dr. Hassell Done as an outpatient.   Jayce Kainz J 04/09/2014

## 2014-04-09 NOTE — Progress Notes (Addendum)
PROGRESS NOTE   RUSHTON EARLY GMW:[id number] DOB: 30-Aug-1965 DOA: 04/03/2014 PCP: [name], MD  HPI: Nathaniel Chambers is a 48 y.o. male is admitted for sigmoid diverticulitis and an abscess (2 X 3 cm) noted on a CT scan on 12/8.He had an episode of diverticulitis in May 2015 and in August 2015 both noted on CT scan and was treated with oral Abx as outpt in May and IV Abx as inpt in August. Colonoscopy in July 2015 showed sigmoid diverticulosis, small internal hemorrhoids, and 3 small adenomas were noted and removed.  Subjective / 24 H Interval events Minimal abdominal pain, tolerating a diet  Assessment/Plan: Principal Problem:   Sigmoid diverticulitis Active Problems:   Diverticulitis   Asthma   Colonic diverticular abscess   Sigmoid diverticulitis with abscess: On admission started on IV Cipro/Flagyl. Diet advanced to soft diet -following which pain worsened, back to clears 2 days ago, advanced again to soft yesterday, tolerating well - surgery changed antibiotics to Invanz today, plan for prolonged use until further evaluated as an outpatient - will ask for a PICC line this afternoon  Asthma -stable, continue home inhaler  Obesity - Counseled on diet and weight control,patient reports exercising more regularly   Diet: DIET SOFT Fluids: NS DVT Prophylaxis: heparin  Code Status: Full Code Family Communication: none  Disposition Plan: remain inpatient, home 1-2 days if stable  Consultants:  GI - signed off 12/12  General surgery   Procedures:  None    Antibiotics Ciprofloxacin 12/8 >> 12/14 Metronidazole 12/8 >> 12/14 Invanz 12/14 >>   Studies  Ct Abdomen Pelvis W Contrast 04/08/2014 1. Thick walled contained perforation/abscess involving the proximal sigmoid colon, present since May, 2015, minimally increased in size since August, 2015, unchanged since the examination 3 days ago, measured above. Minimal residual acute inflammation adjacent to the  proximal sigmoid colon. 2. No new abnormalities elsewhere in the abdomen or pelvis. 3. Small bilateral inguinal hernias containing fat.   Objective  Filed Vitals:   04/07/14 2100 04/08/14 0500 04/08/14 1400 04/09/14 0445  BP: 147/84 135/64 122/72 143/71  Pulse: 73 69 66 68  Temp: 99.1 F (37.3 C) 98.2 F (36.8 C) 98.1 F (36.7 C) 97.9 F (36.6 C)  TempSrc: Oral Oral Oral Oral  Resp: 18 16 18 18   Height:      Weight:      SpO2: 99% 97% 96% 95%    Intake/Output Summary (Last 24 hours) at 04/09/14 1008 Last data filed at 04/08/14 1500  Gross per 24 hour  Intake 2937.5 ml  Output      0 ml  Net 2937.5 ml   Filed Weights   04/03/14 1956  Weight: 150.64 kg (332 lb 1.6 oz)    Exam:  General:  NAD  Cardiovascular: RRR  Respiratory: CTA biL  Abdomen: soft, mild tenderness throughout  MSK: no edema  Neuro: non focal  Data Reviewed: Basic Metabolic Panel:  Recent Labs Lab 04/03/14 1646 04/04/14 0510 04/06/14 0410 04/08/14 0535  NA 140 137 137 138  K 4.3 3.7 3.9 4.2  CL 101 100 102 104  CO2 24 26 25 24   GLUCOSE 83 93 99 103*  BUN 7 7 6 7   CREATININE 1.12 1.20 1.22 1.15  CALCIUM 9.8 9.1 9.2 9.4   Liver Function Tests:  Recent Labs Lab 04/03/14 1646 04/04/14 0510  AST 19 12  ALT 15 13  ALKPHOS 84 82  BILITOT 0.3 0.5  PROT 7.9 6.8  ALBUMIN  3.9 3.3*    Recent Labs Lab 04/03/14 1646  LIPASE 36   CBC:  Recent Labs Lab 04/03/14 1646 04/04/14 0510 04/06/14 0410 04/08/14 0535 04/08/14 1208  WBC 10.5 9.2 8.5 10.4 9.3  NEUTROABS 6.1  --   --   --   --   HGB 13.4 13.3 13.4 14.1 14.3  HCT 42.3 39.7 41.4 43.9 43.0  MCV 86.0 87.3 85.5 86.9 86.5  PLT 338 290 313 305 298   CBG:  Recent Labs Lab 04/05/14 0736 04/06/14 0739 04/07/14 0755 04/08/14 0809 04/09/14 0712  GLUCAP 88 96 115* 93 132*    No results found for this or any previous visit (from the past 240 hour(s)).   Scheduled Meds: . ertapenem  1 g Intravenous Q24H  .  Fluticasone-Salmeterol  1 puff Inhalation BID  . folic acid  1 mg Oral Daily  . heparin  5,000 Units Subcutaneous 3 times per day  . montelukast  10 mg Oral QHS  . multivitamin with minerals  1 tablet Oral Daily  . pantoprazole (PROTONIX) IV  40 mg Intravenous Daily  . saccharomyces boulardii  250 mg Oral BID  . thiamine  100 mg Oral Daily   Continuous Infusions: . sodium chloride 75 mL/hr at 04/07/14 1610    Time spent: 25 minutes  Marzetta Board, MD [facility] Pager [phone]. If 7 PM - 7 AM, please contact night-coverage at [email or web address], password [facility] 04/09/2014, 10:08 AM  LOS: 6 days

## 2014-04-09 NOTE — Progress Notes (Signed)
Peripherally Inserted Central Catheter/Midline Placement  The IV Nurse has discussed with the patient and/or persons authorized to consent for the patient, the purpose of this procedure and the potential benefits and risks involved with this procedure.  The benefits include less needle sticks, lab draws from the catheter and patient may be discharged home with the catheter.  Risks include, but not limited to, infection, bleeding, blood clot (thrombus formation), and puncture of an artery; nerve damage and irregular heat beat.  Alternatives to this procedure were also discussed.  PICC/Midline Placement Documentation  PICC / Midline Single Lumen 50/03/70 PICC Right Basilic 47 cm 0 cm (Active)  Indication for Insertion or Continuance of Line Home intravenous therapies (PICC only) 04/09/2014 10:38 PM  Exposed Catheter (cm) 0 cm 04/09/2014 10:38 PM  Site Assessment Clean;Dry;Intact 04/09/2014 10:38 PM  Dressing Change Due 04/16/14 04/09/2014 10:38 PM       Nathaniel Chambers, Maricela Bo 04/09/2014, 10:43 PM

## 2014-04-10 ENCOUNTER — Other Ambulatory Visit (INDEPENDENT_AMBULATORY_CARE_PROVIDER_SITE_OTHER): Payer: Self-pay

## 2014-04-10 ENCOUNTER — Other Ambulatory Visit (INDEPENDENT_AMBULATORY_CARE_PROVIDER_SITE_OTHER): Payer: Self-pay | Admitting: *Deleted

## 2014-04-10 DIAGNOSIS — K5732 Diverticulitis of large intestine without perforation or abscess without bleeding: Secondary | ICD-10-CM

## 2014-04-10 DIAGNOSIS — E669 Obesity, unspecified: Secondary | ICD-10-CM | POA: Diagnosis present

## 2014-04-10 LAB — BASIC METABOLIC PANEL
Anion gap: 11 (ref 5–15)
BUN: 8 mg/dL (ref 6–23)
CHLORIDE: 104 meq/L (ref 96–112)
CO2: 23 mEq/L (ref 19–32)
Calcium: 9.1 mg/dL (ref 8.4–10.5)
Creatinine, Ser: 1.05 mg/dL (ref 0.50–1.35)
GFR calc Af Amer: >90 mL/min (ref >90)
GFR calc non Af Amer: 82 mL/min — ABNORMAL LOW (ref >90)
Glucose, Bld: 104 mg/dL — ABNORMAL HIGH (ref 70–99)
POTASSIUM: 4.1 meq/L (ref 3.7–5.3)
SODIUM: 138 meq/L (ref 137–147)

## 2014-04-10 LAB — CBC
HEMATOCRIT: 43.6 % (ref 39.0–52.0)
HEMOGLOBIN: 13.7 g/dL (ref 13.0–17.0)
MCH: 27.7 pg (ref 26.0–34.0)
MCHC: 31.4 g/dL (ref 30.0–36.0)
MCV: 88.1 fL (ref 78.0–100.0)
Platelets: 306 10*3/uL (ref 150–400)
RBC: 4.95 MIL/uL (ref 4.22–5.81)
RDW: 14.5 % (ref 11.5–15.5)
WBC: 9.1 10*3/uL (ref 4.0–10.5)

## 2014-04-10 LAB — GLUCOSE, CAPILLARY: Glucose-Capillary: 105 mg/dL — ABNORMAL HIGH (ref 70–99)

## 2014-04-10 MED ORDER — HEPARIN SOD (PORK) LOCK FLUSH 100 UNIT/ML IV SOLN
250.0000 [IU] | INTRAVENOUS | Status: AC | PRN
  Administered 2014-04-10: 250 [IU]

## 2014-04-10 MED ORDER — HYDROCODONE-ACETAMINOPHEN 5-325 MG PO TABS
2.0000 | ORAL_TABLET | Freq: Four times a day (QID) | ORAL | Status: DC | PRN
Start: 2014-04-10 — End: 2014-06-17

## 2014-04-10 MED ORDER — SODIUM CHLORIDE 0.9 % IV SOLN: 1.0000 g | INTRAVENOUS | Status: AC

## 2014-04-10 MED ORDER — SODIUM CHLORIDE 0.9 % IV SOLN: 1.0000 g | INTRAVENOUS | Status: DC

## 2014-04-10 NOTE — Discharge Summary (Addendum)
Physician Discharge Summary  Nathaniel Chambers KMQ:[id number] DOB: 1965/06/19 DOA: 04/03/2014  PCP: [name], MD  Admit date: 04/03/2014 Discharge date: 04/10/2014  Time spent: 35 minutes  Recommendations for Outpatient Follow-up:  1. Discharge home on IV invanz 1 gm q 24 hrs for next 14  Days ( until followed by surgery as oupt and  in 1 week ( needs repeat CT abdomen in 1 week) and decide  on further abx and surgery.. Pt will call Dr Earlie Server office to schedule appt 2. Follow with PCP in 1 week  Discharge Diagnoses:   Principal Problem:   Sigmoid diverticulitis with abscess  Active Problems:   Asthma   Fatty liver   Colonic diverticular abscess   Obesity   Discharge Condition: fair  Diet recommendation: soft  Filed Weights   04/03/14 1956  Weight: 150.64 kg (332 lb 1.6 oz)    History of present illness:  Please refer to admission H&P for details, but in brief, Nathaniel Chambers is a 48 y.o. Obese  male is admitted for sigmoid diverticulitis and an abscess (2 X 3 cm) noted on a CT scan on 12/8.He had an episode of diverticulitis in May 2015 and in August 2015 both noted on CT scan and was treated with oral Abx as outpt in May and IV Abx as inpt in August. Colonoscopy in July 2015 showed sigmoid diverticulosis, small internal hemorrhoids, and 3 small adenomas were noted and removed. Patient seen by [facility] and recommended conservative management with IV abx with plan for elective surgery as outpt  Hospital Course:  Sigmoid diverticulitis with abscess:  Empiric IV Cipro/Flagyl on admisison . Diet advanced to soft diet -following which pain worsened, back to clears 2 days ago, advanced again to soft on 12/13 and now tolerating well - surgery changed antibiotics to Invanz 1 gm daily,  plan for prolonged use until further evaluated as an outpatient - PICC placed on 12/14 -Pain control with prn vicodin. Patient instructed to return to ED if he has any alarming  symptoms like fever, chills, worsening abdominal pain, diarrhea or abdominal distention.  Asthma  -stable, continue home inhaler  Obesity  - Counseled on diet and weight control. patient reports exercising more regularly   Diet: SOFT   Code Status: Full Code Family Communication: none  Disposition Plan:  home   Consultants:  GI - signed off 12/12  General surgery  Procedures:  None   Antibiotics Ciprofloxacin 12/8 >> 12/14 Metronidazole 12/8 >> 12/14 Invanz 12/14 >>      Discharge Exam: Filed Vitals:   04/10/14 0509  BP: 137/66  Pulse: 66  Temp: 98.1 F (36.7 C)  Resp: 16    General: middle aged obese male in NAD  HEENT: no pallor, moist mucosa  chest: clear Chambers/l, no added sounds  CVS: N S1&S2, no murmurs Abd: soft, ND, minimal LLQ tenderness, BS+ Ext: Warm, No edema, RUE PICC  Discharge Instructions You were cared for by a hospitalist during your hospital stay. If you have any questions about your discharge medications or the care you received while you were in the hospital after you are discharged, you can call the unit and asked to speak with the hospitalist on call if the hospitalist that took care of you is not available. Once you are discharged, your primary care physician will handle any further medical issues. Please note that NO REFILLS for any discharge medications will be authorized once you are discharged, as it is imperative  that you return to your primary care physician (or establish a relationship with a primary care physician if you do not have one) for your aftercare needs so that they can reassess your need for medications and monitor your lab values.   Current Discharge Medication List    START taking these medications   Details  ertapenem 1 g in sodium chloride 0.9 % 50 mL Inject 1 g into the vein daily for 14 days ( or until seen by surgery in 1 week) Qty: 14 Dose, Refills: 0    HYDROcodone-acetaminophen (NORCO/VICODIN) 5-325 MG  per tablet Take 2 tablets by mouth every 6 (six) hours as needed for moderate pain. Qty: 40 tablet, Refills: 0      CONTINUE these medications which have NOT CHANGED   Details  albuterol (PROVENTIL HFA;VENTOLIN HFA) 108 (90 BASE) MCG/ACT inhaler Inhale 2 puffs into the lungs every 6 (six) hours as needed for wheezing or shortness of breath (wheezing).     Fluticasone-Salmeterol (ADVAIR) 250-50 MCG/DOSE AEPB Inhale 1 puff into the lungs 2 (two) times daily.     gabapentin (NEURONTIN) 300 MG capsule Take 300 mg by mouth daily as needed (nerve pain).    lidocaine (ASPERCREME W/LIDOCAINE) 4 % cream Apply 1 application topically as needed. Qty: 45 g, Refills: 5    polyethylene glycol (MIRALAX / GLYCOLAX) packet Take 17 g by mouth daily. Qty: 14 each, Refills: 0    tadalafil (CIALIS) 10 MG tablet Take 10 mg by mouth daily as needed for erectile dysfunction (erectile dysfunction).     Vitamin D, Ergocalciferol, (DRISDOL) 50000 UNITS CAPS capsule Take 50,000 Units by mouth every 7 (seven) days.     albuterol (PROVENTIL) (2.5 MG/3ML) 0.083% nebulizer solution Take 2.5 mg by nebulization every 6 (six) hours as needed for wheezing or shortness of breath.     clotrimazole-betamethasone (LOTRISONE) cream Apply 1 application topically 2 (two) times daily as needed.  Refills: 0    DULoxetine (CYMBALTA) 30 MG capsule Take 1 capsule (30 mg total) by mouth daily. Qty: 30 capsule, Refills: 3    montelukast (SINGULAIR) 10 MG tablet Take 10 mg by mouth daily as needed (allergies).     ondansetron (ZOFRAN ODT) 8 MG disintegrating tablet Take 1 tablet (8 mg total) by mouth every 8 (eight) hours as needed for nausea or vomiting. Qty: 20 tablet, Refills: 0    terbinafine (LAMISIL) 250 MG tablet Take 250 mg by mouth daily.  Refills: 2      STOP taking these medications     morphine (MSIR) 15 MG tablet        Allergies  Allergen Reactions  . Penicillins Shortness Of Breath    Swelling of uvula   . Solu-Medrol [Methylprednisolone Acetate] Anaphylaxis  . Dilaudid [Hydromorphone Hcl] Nausea And Vomiting  . Oxycodone Nausea And Vomiting  . Percocet [Oxycodone-Acetaminophen] Nausea And Vomiting   Follow-up Information    Follow up with Nathaniel B, MD. Schedule an appointment as soon as possible for a visit in 1 week.   Specialty:  General Surgery   Why:  Plan to have CT scan repeated in 1 week and follow up with Dr. Hassell Done after that.   Contact information:   [address] [place] [address] [phone]       Follow up with [name], MD. Schedule an appointment as soon as possible for a visit in 1 week.   Specialty:  Family Medicine   Contact information:   [place]  [place] [address] [phone]        The results of significant diagnostics from this hospitalization (including imaging, microbiology, ancillary and laboratory) are listed below for reference.    Significant Diagnostic Studies: Ct Abdomen Pelvis W Contrast  04/08/2014   CLINICAL DATA:  Followup of sigmoid diverticulitis and diverticular abscess. Epigastric and left lower quadrant abdominal pain and tenderness, worse after eating. Mild leukocytosis with white blood [place] count of 09323. Surgical history includes laparoscopic gastric banding and right inguinal hernia repair.  EXAM: CT ABDOMEN AND PELVIS WITH CONTRAST  TECHNIQUE: Multidetector CT imaging of the abdomen and pelvis was performed using the standard protocol following bolus administration of intravenous contrast.  CONTRAST:  133mL OMNIPAQUE IOHEXOL 300 MG/ML IV.  COMPARISON:  04/03/2014, 12/15/2013, 12/12/2013, 09/15/2013, 08/18/2010.  FINDINGS: Thick walled contained perforation/abscess involving the proximal sigmoid colon to the right of midline in the mid pelvis measuring approximately 3.1 x 2.1 x 3.4 cm, present since May, 2015, minimally increased in size since August, 2015, unchanged since the  examination 3 days ago. Thickening of the wall of the proximal sigmoid colon with persistent mild edema/inflammation in the adjacent fat. No acute inflammatory changes elsewhere in the abdomen or pelvis.  Normal-appearing liver, spleen, pancreas, adrenal glands, kidneys, and gallbladder. No biliary ductal dilation. No visible aortoiliofemoral atherosclerosis. No significant lymphadenopathy.  Laparoscopic gastric band unchanged, shown on the scout image to be appropriately oriented. Subcutaneous port in the right upper anterior abdominal wall. Intact catheter between the port in the band. Normal-appearing stomach and small bowel. Normal appendix in the right upper pelvis, filled with oral contrast. No ascites.  Urinary bladder decompressed and unremarkable. Prostate gland and seminal vesicles unremarkable for age. Small bilateral inguinal hernias containing fat.  Bone window images unremarkable. Visualized lung bases clear. Heart size normal with borderline left ventricular enlargement.  IMPRESSION: 1. Thick walled contained perforation/abscess involving the proximal sigmoid colon, present since May, 2015, minimally increased in size since August, 2015, unchanged since the examination 3 days ago, measured above. Minimal residual acute inflammation adjacent to the proximal sigmoid colon. 2. No new abnormalities elsewhere in the abdomen or pelvis. 3. Small bilateral inguinal hernias containing fat.   Electronically Signed   By: Evangeline Dakin M.D.   On: 04/08/2014 11:07   Ct Abdomen Pelvis W Contrast  04/03/2014   CLINICAL DATA:  Worsening generalized abdominal pain for 4 weeks. Sigmoid diverticulitis. Gastric lap band.  EXAM: CT ABDOMEN AND PELVIS WITH CONTRAST  TECHNIQUE: Multidetector CT imaging of the abdomen and pelvis was performed using the standard protocol following bolus administration of intravenous contrast.  CONTRAST:  100 mL Omnipaque 300  COMPARISON:  12/15/2013  FINDINGS: Lower Chest:   Unremarkable.  Hepatobiliary: No masses or other significant abnormality identified. Gallbladder unremarkable.  Pancreas: No mass, inflammatory changes, or other parenchymal abnormality identified.  Spleen:  Within normal limits in size and appearance.  Adrenal Glands:  No mass identified.  Kidneys/Urinary Tract: No masses identified. No evidence of hydronephrosis.  Stomach/Bowel/Peritoneum: Gastric lap band remains in appropriate position. No evidence of bowel obstruction. Mild sigmoid diverticulitis shows no significant change compared to previous study. There is a more well-defined gas and fluid collection in the adjacent sigmoid mesocolon which measures 2 x 3 cm on image 69 of series 2. No other abscess or free fluid identified. Normal appendix visualized.  Vascular/Lymphatic: No pathologically enlarged lymph nodes identified. No other significant abnormality identified.  Reproductive:  No mass or other significant abnormality  identified.  Other: Small bilateral inguinal hernias containing only fat again demonstrated.  Musculoskeletal:  No suspicious bone lesions identified.  IMPRESSION: Persistent mild sigmoid diverticulitis, with more well-defined 2 x 3 cm diverticular abscess now seen in adjacent sigmoid mesocolon. No other complication identified.   Electronically Signed   By: Earle Gell M.D.   On: 04/03/2014 16:02    Microbiology: No results found for this or any previous visit (from the past 240 hour(s)).   Labs: Basic Metabolic Panel:  Recent Labs Lab 04/03/14 1646 04/04/14 0510 04/06/14 0410 04/08/14 0535 04/10/14 0610  NA 140 137 137 138 138  K 4.3 3.7 3.9 4.2 4.1  CL 101 100 102 104 104  CO2 24 26 25 24 23   GLUCOSE 83 93 99 103* 104*  BUN 7 7 6 7 8   CREATININE 1.12 1.20 1.22 1.15 1.05  CALCIUM 9.8 9.1 9.2 9.4 9.1   Liver Function Tests:  Recent Labs Lab 04/03/14 1646 04/04/14 0510  AST 19 12  ALT 15 13  ALKPHOS 84 82  BILITOT 0.3 0.5  PROT 7.9 6.8  ALBUMIN 3.9 3.3*     Recent Labs Lab 04/03/14 1646  LIPASE 36   No results for input(s): AMMONIA in the last 168 hours. CBC:  Recent Labs Lab 04/03/14 1646 04/04/14 0510 04/06/14 0410 04/08/14 0535 04/08/14 1208 04/10/14 0610  WBC 10.5 9.2 8.5 10.4 9.3 9.1  NEUTROABS 6.1  --   --   --   --   --   HGB 13.4 13.3 13.4 14.1 14.3 13.7  HCT 42.3 39.7 41.4 43.9 43.0 43.6  MCV 86.0 87.3 85.5 86.9 86.5 88.1  PLT 338 290 313 305 298 306   Cardiac Enzymes: No results for input(s): CKTOTAL, CKMB, CKMBINDEX, TROPONINI in the last 168 hours. BNP: BNP (last 3 results) No results for input(s): PROBNP in the last 8760 hours. CBG:  Recent Labs Lab 04/06/14 0739 04/07/14 0755 04/08/14 0809 04/09/14 0712 04/10/14 0723  GLUCAP 96 115* 93 132* 105*       Signed:  Korinne Greenstein  [facility] 04/10/2014, 12:39 PM

## 2014-04-10 NOTE — Discharge Instructions (Signed)
Diverticulitis °Diverticulitis is when small pockets that have formed in your colon (large intestine) become infected or swollen. °HOME CARE °· Follow your doctor's instructions. °· Follow a special diet if told by your doctor. °· When you feel better, your doctor may tell you to change your diet. You may be told to eat a lot of fiber. Fruits and vegetables are good sources of fiber. Fiber makes it easier to poop (have bowel movements). °· Take supplements or probiotics as told by your doctor. °· Only take medicines as told by your doctor. °· Keep all follow-up visits with your doctor. °GET HELP IF: °· Your pain does not get better. °· You have a hard time eating food. °· You are not pooping like normal. °GET HELP RIGHT AWAY IF: °· Your pain gets worse. °· Your problems do not get better. °· Your problems suddenly get worse. °· You have a fever. °· You keep throwing up (vomiting). °· You have bloody or black, tarry poop (stool). °MAKE SURE YOU:  °· Understand these instructions. °· Will watch your condition. °· Will get help right away if you are not doing well or get worse. °Document Released: 09/30/2007 Document Revised: 04/18/2013 Document Reviewed: 03/08/2013 °Exit[employer]® Patient Information ©2015 [employer]. This information is not intended to replace advice given to you by your health care provider. Make sure you discuss any questions you have with your health care provider. ° °

## 2014-04-10 NOTE — Progress Notes (Signed)
Subjective: He still has some pain but is feeling better.    Objective: Vital signs in last 24 hours: Temp:  [98.1 F (36.7 C)-98.4 F (36.9 C)] 98.1 F (36.7 C) (12/15 0509) Pulse Rate:  [66-78] 66 (12/15 0509) Resp:  [16-18] 16 (12/15 0509) BP: (114-152)/(66-77) 137/66 mmHg (12/15 0509) SpO2:  [97 %-98 %] 98 % (12/15 0832) Last BM Date: 04/08/14 PO 1330  Diet: soft NO Bm recorded. Afebrile, VSS Labs are all looking good. CT 04/08/14 Intake/Output from previous day: 12/14 0701 - 12/15 0700 In: 4305 [P.O.:1330; I.V.:2925; IV Piggyback:50] Out: 400 [Urine:400] Intake/Output this shift:    General appearance: alert, cooperative and no distress GI: soft, non-tender; bowel sounds normal; no masses,  no organomegaly  Lab Results:   Recent Labs  04/08/14 1208 04/10/14 0610  WBC 9.3 9.1  HGB 14.3 13.7  HCT 43.0 43.6  PLT 298 306    BMET  Recent Labs  04/08/14 0535 04/10/14 0610  NA 138 138  K 4.2 4.1  CL 104 104  CO2 24 23  GLUCOSE 103* 104*  BUN 7 8  CREATININE 1.15 1.05  CALCIUM 9.4 9.1   PT/INR No results for input(s): LABPROT, INR in the last 72 hours.   Recent Labs Lab 04/03/14 1646 04/04/14 0510  AST 19 12  ALT 15 13  ALKPHOS 84 82  BILITOT 0.3 0.5  PROT 7.9 6.8  ALBUMIN 3.9 3.3*     Lipase     Component Value Date/Time   LIPASE 36 04/03/2014 1646     Studies/Results: Ct Abdomen Pelvis W Contrast  04/08/2014   CLINICAL DATA:  Followup of sigmoid diverticulitis and diverticular abscess. Epigastric and left lower quadrant abdominal pain and tenderness, worse after eating. Mild leukocytosis with white blood [place] count of 7[address]. Surgical history includes laparoscopic gastric banding and right inguinal hernia repair.  EXAM: CT ABDOMEN AND PELVIS WITH CONTRAST  TECHNIQUE: Multidetector CT imaging of the abdomen and pelvis was performed using the standard protocol following bolus administration of intravenous contrast.  CONTRAST:  178mL  OMNIPAQUE IOHEXOL 300 MG/ML IV.  COMPARISON:  04/03/2014, 12/15/2013, 12/12/2013, 09/15/2013, 08/18/2010.  FINDINGS: Thick walled contained perforation/abscess involving the proximal sigmoid colon to the right of midline in the mid pelvis measuring approximately 3.1 x 2.1 x 3.4 cm, present since May, 2015, minimally increased in size since August, 2015, unchanged since the examination 3 days ago. Thickening of the wall of the proximal sigmoid colon with persistent mild edema/inflammation in the adjacent fat. No acute inflammatory changes elsewhere in the abdomen or pelvis.  Normal-appearing liver, spleen, pancreas, adrenal glands, kidneys, and gallbladder. No biliary ductal dilation. No visible aortoiliofemoral atherosclerosis. No significant lymphadenopathy.  Laparoscopic gastric band unchanged, shown on the scout image to be appropriately oriented. Subcutaneous port in the right upper anterior abdominal wall. Intact catheter between the port in the band. Normal-appearing stomach and small bowel. Normal appendix in the right upper pelvis, filled with oral contrast. No ascites.  Urinary bladder decompressed and unremarkable. Prostate gland and seminal vesicles unremarkable for age. Small bilateral inguinal hernias containing fat.  Bone window images unremarkable. Visualized lung bases clear. Heart size normal with borderline left ventricular enlargement.  IMPRESSION: 1. Thick walled contained perforation/abscess involving the proximal sigmoid colon, present since May, 2015, minimally increased in size since August, 2015, unchanged since the examination 3 days ago, measured above. Minimal residual acute inflammation adjacent to the proximal sigmoid colon. 2. No new abnormalities elsewhere in the abdomen or pelvis. 3.  Small bilateral inguinal hernias containing fat.   Electronically Signed   By: Evangeline Dakin M.D.   On: 04/08/2014 11:07    Medications: . ertapenem  1 g Intravenous Q24H  .  Fluticasone-Salmeterol  1 puff Inhalation BID  . folic acid  1 mg Oral Daily  . heparin  5,000 Units Subcutaneous 3 times per day  . montelukast  10 mg Oral QHS  . multivitamin with minerals  1 tablet Oral Daily  . pantoprazole (PROTONIX) IV  40 mg Intravenous Daily  . saccharomyces boulardii  250 mg Oral BID  . thiamine  100 mg Oral Daily    Assessment/Plan 1.  Sigmoid diverticulitis with chronic peridiverticular abscess, 4th episode   Recurrent Diverticulitis with 2 x 3 cm abscess 2. Hx of asthma 3. Body mass index is 42.6 with prior gastric banding - Dr. Johnathan Hausen 4. Desmoid fibromatosis 5. Meralgia paraesthetica  Plan:  Change to IV InVanz yesterday, and if tolerated he could go home of this and have repeat CT as an outpatient in one week. Will have him follow up with Dr. Hassell Done as an outpatient.  He would like to get surgery done before the end of the year, but I told him I did not think this was likely.  Dr. Zella Richer discussed plan yesterday with Dr. Clementeen Graham.     LOS: 7 days    Nathaniel Chambers 04/10/2014

## 2014-04-17 ENCOUNTER — Ambulatory Visit
Admission: RE | Admit: 2014-04-17 | Discharge: 2014-04-17 | Disposition: A | Discharge status: Home / Self Care | Payer: BC Managed Care – PPO | Source: Ambulatory Visit | Attending: General Surgery | Admitting: General Surgery

## 2014-04-17 MED ORDER — IOHEXOL 300 MG/ML  SOLN
125.0000 mL | Freq: Once | INTRAMUSCULAR | Status: AC | PRN
  Administered 2014-04-17: 125 mL via INTRAVENOUS

## 2014-05-31 ENCOUNTER — Other Ambulatory Visit (INDEPENDENT_AMBULATORY_CARE_PROVIDER_SITE_OTHER): Payer: Self-pay | Admitting: Surgery

## 2014-05-31 ENCOUNTER — Ambulatory Visit
Admission: RE | Admit: 2014-05-31 | Discharge: 2014-05-31 | Disposition: A | Discharge status: Home / Self Care | Payer: BLUE CROSS/BLUE SHIELD | Source: Ambulatory Visit | Attending: Surgery | Admitting: Surgery

## 2014-05-31 DIAGNOSIS — K5732 Diverticulitis of large intestine without perforation or abscess without bleeding: Secondary | ICD-10-CM

## 2014-05-31 MED ORDER — IOHEXOL 300 MG/ML  SOLN
125.0000 mL | Freq: Once | INTRAMUSCULAR | Status: AC | PRN
  Administered 2014-05-31: 125 mL via INTRAVENOUS

## 2014-06-11 ENCOUNTER — Encounter (HOSPITAL_COMMUNITY): Payer: Self-pay | Admitting: Emergency Medicine

## 2014-06-11 ENCOUNTER — Inpatient Hospital Stay (HOSPITAL_COMMUNITY)
Admission: EM | Admit: 2014-06-11 | Discharge: 2014-06-17 | DRG: 392 | Disposition: A | Discharge status: Home Health | Payer: BLUE CROSS/BLUE SHIELD | Attending: Internal Medicine | Admitting: Internal Medicine

## 2014-06-11 ENCOUNTER — Emergency Department (HOSPITAL_COMMUNITY): Payer: BLUE CROSS/BLUE SHIELD

## 2014-06-11 DIAGNOSIS — Z6841 Body Mass Index (BMI) 40.0 and over, adult: Secondary | ICD-10-CM

## 2014-06-11 DIAGNOSIS — A047 Enterocolitis due to Clostridium difficile: Secondary | ICD-10-CM | POA: Diagnosis present

## 2014-06-11 DIAGNOSIS — Z9884 Bariatric surgery status: Secondary | ICD-10-CM

## 2014-06-11 DIAGNOSIS — J45909 Unspecified asthma, uncomplicated: Secondary | ICD-10-CM | POA: Diagnosis present

## 2014-06-11 DIAGNOSIS — G5710 Meralgia paresthetica, unspecified lower limb: Secondary | ICD-10-CM | POA: Diagnosis present

## 2014-06-11 DIAGNOSIS — K5732 Diverticulitis of large intestine without perforation or abscess without bleeding: Principal | ICD-10-CM | POA: Insufficient documentation

## 2014-06-11 DIAGNOSIS — R1084 Generalized abdominal pain: Secondary | ICD-10-CM | POA: Diagnosis not present

## 2014-06-11 DIAGNOSIS — K5780 Diverticulitis of intestine, part unspecified, with perforation and abscess without bleeding: Secondary | ICD-10-CM | POA: Diagnosis not present

## 2014-06-11 DIAGNOSIS — E669 Obesity, unspecified: Secondary | ICD-10-CM | POA: Diagnosis present

## 2014-06-11 DIAGNOSIS — K5700 Diverticulitis of small intestine with perforation and abscess without bleeding: Secondary | ICD-10-CM

## 2014-06-11 DIAGNOSIS — K5792 Diverticulitis of intestine, part unspecified, without perforation or abscess without bleeding: Secondary | ICD-10-CM | POA: Diagnosis present

## 2014-06-11 DIAGNOSIS — A0472 Enterocolitis due to Clostridium difficile, not specified as recurrent: Secondary | ICD-10-CM | POA: Insufficient documentation

## 2014-06-11 DIAGNOSIS — K567 Ileus, unspecified: Secondary | ICD-10-CM

## 2014-06-11 DIAGNOSIS — K5900 Constipation, unspecified: Secondary | ICD-10-CM | POA: Diagnosis not present

## 2014-06-11 DIAGNOSIS — Z87891 Personal history of nicotine dependence: Secondary | ICD-10-CM

## 2014-06-11 DIAGNOSIS — Z79899 Other long term (current) drug therapy: Secondary | ICD-10-CM

## 2014-06-11 DIAGNOSIS — R109 Unspecified abdominal pain: Secondary | ICD-10-CM

## 2014-06-11 LAB — CBC WITH DIFFERENTIAL/PLATELET
Basophils Absolute: 0 10*3/uL (ref 0.0–0.1)
Basophils Relative: 0 % (ref 0–1)
Eosinophils Absolute: 0.1 10*3/uL (ref 0.0–0.7)
Eosinophils Relative: 1 % (ref 0–5)
HEMATOCRIT: 48 % (ref 39.0–52.0)
HEMOGLOBIN: 15.7 g/dL (ref 13.0–17.0)
LYMPHS PCT: 10 % — AB (ref 12–46)
Lymphs Abs: 1.3 10*3/uL (ref 0.7–4.0)
MCH: 28.8 pg (ref 26.0–34.0)
MCHC: 32.7 g/dL (ref 30.0–36.0)
MCV: 88.1 fL (ref 78.0–100.0)
MONOS PCT: 5 % (ref 3–12)
Monocytes Absolute: 0.6 10*3/uL (ref 0.1–1.0)
NEUTROS ABS: 10.8 10*3/uL — AB (ref 1.7–7.7)
NEUTROS PCT: 84 % — AB (ref 43–77)
Platelets: 322 10*3/uL (ref 150–400)
RBC: 5.45 MIL/uL (ref 4.22–5.81)
RDW: 14.3 % (ref 11.5–15.5)
WBC: 12.8 10*3/uL — AB (ref 4.0–10.5)

## 2014-06-11 LAB — COMPREHENSIVE METABOLIC PANEL
ALBUMIN: 4.3 g/dL (ref 3.5–5.2)
ALK PHOS: 71 U/L (ref 39–117)
ALT: 25 U/L (ref 0–53)
AST: 20 U/L (ref 0–37)
Anion gap: 8 (ref 5–15)
BUN: 12 mg/dL (ref 6–23)
CHLORIDE: 100 mmol/L (ref 96–112)
CO2: 29 mmol/L (ref 19–32)
CREATININE: 1.3 mg/dL (ref 0.50–1.35)
Calcium: 9.7 mg/dL (ref 8.4–10.5)
GFR calc Af Amer: 74 mL/min — ABNORMAL LOW (ref >90)
GFR, EST NON AFRICAN AMERICAN: 64 mL/min — AB (ref >90)
Glucose, Bld: 105 mg/dL — ABNORMAL HIGH (ref 70–99)
POTASSIUM: 4.3 mmol/L (ref 3.5–5.1)
SODIUM: 137 mmol/L (ref 135–145)
Total Bilirubin: 0.6 mg/dL (ref 0.3–1.2)
Total Protein: 7.9 g/dL (ref 6.0–8.3)

## 2014-06-11 LAB — LIPASE, BLOOD: LIPASE: 28 U/L (ref 11–59)

## 2014-06-11 LAB — TYPE AND SCREEN
ABO/RH(D): A POS
Antibody Screen: NEGATIVE

## 2014-06-11 LAB — GLUCOSE, CAPILLARY: Glucose-Capillary: 82 mg/dL (ref 70–99)

## 2014-06-11 LAB — ABO/RH: ABO/RH(D): A POS

## 2014-06-11 MED ORDER — METRONIDAZOLE IN NACL 5-0.79 MG/ML-% IV SOLN
500.0000 mg | Freq: Once | INTRAVENOUS | Status: AC
  Administered 2014-06-11: 500 mg via INTRAVENOUS
  Filled 2014-06-11: qty 100

## 2014-06-11 MED ORDER — MOMETASONE FURO-FORMOTEROL FUM 100-5 MCG/ACT IN AERO: 2.0000 | INHALATION_SPRAY | Freq: Two times a day (BID) | RESPIRATORY_TRACT | Status: DC

## 2014-06-11 MED ORDER — HYDROCODONE-ACETAMINOPHEN 5-325 MG PO TABS
1.0000 | ORAL_TABLET | ORAL | Status: DC | PRN
  Administered 2014-06-11 – 2014-06-14 (×6): 2 via ORAL
  Administered 2014-06-15: 1 via ORAL
  Administered 2014-06-15 – 2014-06-16 (×7): 2 via ORAL
  Filled 2014-06-11 (×7): qty 2
  Filled 2014-06-11: qty 1
  Filled 2014-06-11 (×7): qty 2

## 2014-06-11 MED ORDER — SODIUM CHLORIDE 0.9 % IV SOLN
INTRAVENOUS | Status: AC
  Administered 2014-06-11: 11:00:00 via INTRAVENOUS

## 2014-06-11 MED ORDER — ENOXAPARIN SODIUM 40 MG/0.4ML ~~LOC~~ SOLN
40.0000 mg | SUBCUTANEOUS | Status: DC
  Administered 2014-06-11 – 2014-06-16 (×6): 40 mg via SUBCUTANEOUS
  Filled 2014-06-11 (×7): qty 0.4

## 2014-06-11 MED ORDER — SODIUM CHLORIDE 0.9 % IV BOLUS (SEPSIS)
1000.0000 mL | Freq: Once | INTRAVENOUS | Status: AC
  Administered 2014-06-11: 1000 mL via INTRAVENOUS

## 2014-06-11 MED ORDER — ALUM & MAG HYDROXIDE-SIMETH 200-200-20 MG/5ML PO SUSP: 30.0000 mL | Freq: Four times a day (QID) | ORAL | Status: DC | PRN

## 2014-06-11 MED ORDER — METRONIDAZOLE IN NACL 5-0.79 MG/ML-% IV SOLN
500.0000 mg | Freq: Three times a day (TID) | INTRAVENOUS | Status: DC
  Administered 2014-06-11 – 2014-06-13 (×6): 500 mg via INTRAVENOUS
  Filled 2014-06-11 (×7): qty 100

## 2014-06-11 MED ORDER — CIPROFLOXACIN IN D5W 400 MG/200ML IV SOLN
400.0000 mg | Freq: Two times a day (BID) | INTRAVENOUS | Status: DC
  Administered 2014-06-11 – 2014-06-13 (×4): 400 mg via INTRAVENOUS
  Filled 2014-06-11 (×5): qty 200

## 2014-06-11 MED ORDER — ALBUTEROL SULFATE HFA 108 (90 BASE) MCG/ACT IN AERS
2.0000 | INHALATION_SPRAY | Freq: Four times a day (QID) | RESPIRATORY_TRACT | Status: DC | PRN
  Administered 2014-06-12: 2 via RESPIRATORY_TRACT

## 2014-06-11 MED ORDER — ALBUTEROL SULFATE HFA 108 (90 BASE) MCG/ACT IN AERS: 2.0000 | INHALATION_SPRAY | Freq: Four times a day (QID) | RESPIRATORY_TRACT | Status: DC | PRN

## 2014-06-11 MED ORDER — SODIUM CHLORIDE 0.9 % IV SOLN
80.0000 mg | Freq: Once | INTRAVENOUS | Status: AC
  Administered 2014-06-11: 80 mg via INTRAVENOUS
  Filled 2014-06-11: qty 80

## 2014-06-11 MED ORDER — MORPHINE SULFATE 4 MG/ML IJ SOLN
4.0000 mg | Freq: Once | INTRAMUSCULAR | Status: AC
  Administered 2014-06-11: 4 mg via INTRAVENOUS
  Filled 2014-06-11: qty 1

## 2014-06-11 MED ORDER — IOHEXOL 300 MG/ML  SOLN
100.0000 mL | Freq: Once | INTRAMUSCULAR | Status: AC | PRN
  Administered 2014-06-11: 100 mL via INTRAVENOUS

## 2014-06-11 MED ORDER — ONDANSETRON HCL 4 MG/2ML IJ SOLN
4.0000 mg | Freq: Three times a day (TID) | INTRAMUSCULAR | Status: DC | PRN
  Filled 2014-06-11: qty 2

## 2014-06-11 MED ORDER — SODIUM CHLORIDE 0.9 % IV SOLN
INTRAVENOUS | Status: DC
  Administered 2014-06-11 – 2014-06-16 (×10): via INTRAVENOUS

## 2014-06-11 MED ORDER — MORPHINE SULFATE 2 MG/ML IJ SOLN
2.0000 mg | INTRAMUSCULAR | Status: DC | PRN
  Administered 2014-06-11 – 2014-06-12 (×3): 4 mg via INTRAVENOUS
  Administered 2014-06-12 – 2014-06-13 (×3): 2 mg via INTRAVENOUS
  Administered 2014-06-13 (×2): 4 mg via INTRAVENOUS
  Administered 2014-06-14: 2 mg via INTRAVENOUS
  Filled 2014-06-11: qty 2
  Filled 2014-06-11 (×2): qty 1
  Filled 2014-06-11: qty 2
  Filled 2014-06-11: qty 1
  Filled 2014-06-11: qty 2
  Filled 2014-06-11: qty 1
  Filled 2014-06-11 (×2): qty 2

## 2014-06-11 MED ORDER — ACETAMINOPHEN 650 MG RE SUPP: 650.0000 mg | Freq: Four times a day (QID) | RECTAL | Status: DC | PRN

## 2014-06-11 MED ORDER — ACETAMINOPHEN 325 MG PO TABS: 650.0000 mg | ORAL_TABLET | Freq: Four times a day (QID) | ORAL | Status: DC | PRN

## 2014-06-11 MED ORDER — IOHEXOL 300 MG/ML  SOLN
50.0000 mL | Freq: Once | INTRAMUSCULAR | Status: AC | PRN
  Administered 2014-06-11: 50 mL via ORAL

## 2014-06-11 MED ORDER — FLUTICASONE-SALMETEROL 250-50 MCG/DOSE IN AEPB
1.0000 | INHALATION_SPRAY | Freq: Two times a day (BID) | RESPIRATORY_TRACT | Status: DC
  Administered 2014-06-11 – 2014-06-16 (×11): 1 via RESPIRATORY_TRACT

## 2014-06-11 MED ORDER — CIPROFLOXACIN IN D5W 400 MG/200ML IV SOLN
400.0000 mg | Freq: Once | INTRAVENOUS | Status: AC
  Administered 2014-06-11: 400 mg via INTRAVENOUS
  Filled 2014-06-11: qty 200

## 2014-06-11 MED ORDER — MORPHINE SULFATE 4 MG/ML IJ SOLN
8.0000 mg | Freq: Once | INTRAMUSCULAR | Status: AC
  Administered 2014-06-11: 8 mg via INTRAVENOUS
  Filled 2014-06-11: qty 2

## 2014-06-11 MED ORDER — ONDANSETRON HCL 4 MG/2ML IJ SOLN
4.0000 mg | Freq: Four times a day (QID) | INTRAMUSCULAR | Status: DC | PRN
  Administered 2014-06-11 – 2014-06-16 (×8): 4 mg via INTRAVENOUS
  Filled 2014-06-11 (×8): qty 2

## 2014-06-11 MED ORDER — ONDANSETRON HCL 4 MG/2ML IJ SOLN
4.0000 mg | Freq: Once | INTRAMUSCULAR | Status: AC
  Administered 2014-06-11: 4 mg via INTRAVENOUS
  Filled 2014-06-11: qty 2

## 2014-06-11 MED ORDER — ONDANSETRON HCL 4 MG PO TABS
4.0000 mg | ORAL_TABLET | Freq: Four times a day (QID) | ORAL | Status: DC | PRN
  Administered 2014-06-14 – 2014-06-16 (×3): 4 mg via ORAL
  Filled 2014-06-11 (×3): qty 1

## 2014-06-11 MED ORDER — PROMETHAZINE HCL 25 MG/ML IJ SOLN
12.5000 mg | Freq: Once | INTRAMUSCULAR | Status: AC
  Administered 2014-06-11: 12.5 mg via INTRAVENOUS
  Filled 2014-06-11: qty 1

## 2014-06-11 NOTE — ED Notes (Signed)
Pt states he has diverticulitis and has been having flares off and on since March  Pt states for the past month he has been back and forth to the drs  Pt states last night he started having vomiting and it has blood in it  Pt states he is having abd pain in the middle of his abdomen  Pt states he has been having diarrhea  Pt states when he wipes there is blood on the tissue

## 2014-06-11 NOTE — ED Notes (Addendum)
General surgery at bedside. 

## 2014-06-11 NOTE — ED Notes (Signed)
This RN woke patient to remind him to drink oral CT contrast. Pt sitting up sipping contrast now.

## 2014-06-11 NOTE — ED Provider Notes (Addendum)
Pt signed out at [facility] that ct pending.  Ct returns, noted made of changes c/w diverticulitis, although mildly improved as compared to prior, recent ct.   Pt indicates he has been taking augmentin as prescribed, but that pain persists/worse, esp LLQ, and that he has recent nvd.   Pt requests additional pain med. marked LLQ tenderness, no rebound or guarding. Morphine iv, zofran iv.    Given recent abx use and new diarrhea, c diff pcr added to labs.   Given persistent pain, tenderness, nvd, will plan admit for ivf, abx, pain and nausea control.  As pt most recently followed/managed by gen surgery/Dr Hassell Done for this issue - consulted gen surg to see in ED.  Admit/consult team to be determined post gen surg eval.  gen surgery has evaluated in ED - they recommend admit to medical service for iv abx, they will follow.      Mirna Mires, MD 06/11/14 [phone]

## 2014-06-11 NOTE — ED Notes (Addendum)
Called report to [facility] on 5W. Will transport pt in 10 min. Bariatric bed just arrived.

## 2014-06-11 NOTE — ED Notes (Signed)
MD at bedside. 

## 2014-06-11 NOTE — ED Notes (Addendum)
PO contrast scanned for pending CT Patient states that he is not going to drink the contrast until Phenergan takes effect Patient still has not had any episode of emesis or hematemesis since arriving in ED Patient placed on cardiac monitor Due to patient's body habitus and comfort, only one side rail up Call bell in reach and wife at bedside

## 2014-06-11 NOTE — ED Provider Notes (Signed)
CSN: [id number]     Arrival date & time 06/11/14  0431 History   First MD Initiated Contact with Patient 06/11/14 0441     Chief Complaint  Patient presents with  . Hematemesis     (Consider location/radiation/quality/duration/timing/severity/associated sxs/prior Treatment) HPI Patient has had chronic diverticulitis since December. Was admitted in December with diverticulitis and abscess. Treated nonsurgically with IV antibiotics. Sent home with a PICC line in place on Invanz. Patient recently was followed up by surgery 2 weeks ago. Repeat CT scan that showed improving diverticulitis. Is currently taking Augmentin. He presents this evening with increased abdominal pain and one episode of vomiting. Patient states the vomitus had red blood streaks through it. He also has had multiple episodes of diarrhea this evening. States he does not notice blood in the stool but small amount of blood on toilet paper. He's had chills without fever. Past Medical History  Diagnosis Date  . Asthma   . Pneumonia     history of  . 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 Leonides Schanz  . Meralgia paraesthetica March 2015    Bilaterally  . Diverticulitis    Past Surgical History  Procedure Laterality Date  . Laparoscopic gastric banding  04-26-2007  . Repair recurrent right inguinal hernia  10-22-2008  . Surg. for undescended testicles/ bilateral inguinal hernia repair  AGE 76 MON OLD  . Left shoulder surg  2000    rotator cuff  . Hernia repair  08/2008    rt inguinal  . Shoulder arthroscopy  01/2009    rt   . Shoulder arthroscopy  07/01/2011    Procedure: ARTHROSCOPY SHOULDER;  Surgeon: Magnus Sinning, MD;  Location: [facility];  Service: Orthopedics;  Laterality: Left;  WITH LABRAL DEBRIDEMENT  . Shoulder open rotator cuff repair  07/01/2011    Procedure: ROTATOR CUFF REPAIR SHOULDER OPEN;  Surgeon: Magnus Sinning, MD;  Location: [place];  Service: Orthopedics;   Laterality: Left;  OPEN DISTAL CLAVICLE REPAIR  . Vasectomy    . Leg surgery      cancer   Family History  Problem Relation Age of Onset  . Heart disease Father   . Cancer Mother     kidney, spleen   History  Substance Use Topics  . Smoking status: Former Smoker -- 0.25 packs/day for 1 years    Quit date: 06/25/1988  . Smokeless tobacco: Former Systems developer     Comment: smoked socially  . Alcohol Use: No    Review of Systems  Constitutional: Positive for chills. Negative for fever.  Respiratory: Negative for shortness of breath.   Cardiovascular: Negative for chest pain.  Gastrointestinal: Positive for nausea, vomiting, abdominal pain, diarrhea and anal bleeding.  Musculoskeletal: Negative for back pain.  Neurological: Negative for dizziness, weakness, light-headedness, numbness and headaches.  All other systems reviewed and are negative.     Allergies  Penicillins; Solu-medrol; Dilaudid; Oxycodone; and Percocet  Home Medications   Prior to Admission medications   Medication Sig Start Date End Date Taking? Authorizing Provider  albuterol (PROVENTIL HFA;VENTOLIN HFA) 108 (90 BASE) MCG/ACT inhaler Inhale 2 puffs into the lungs every 6 (six) hours as needed for wheezing or shortness of breath (wheezing).     Historical Provider, MD  albuterol (PROVENTIL) (2.5 MG/3ML) 0.083% nebulizer solution Take 2.5 mg by nebulization every 6 (six) hours as needed for wheezing or shortness of breath.     Historical Provider, MD  clotrimazole-betamethasone (LOTRISONE) cream Apply  1 application topically 2 (two) times daily as needed.  02/06/14   Historical Provider, MD  DULoxetine (CYMBALTA) 30 MG capsule Take 1 capsule (30 mg total) by mouth daily. 03/30/14   Donika K Patel, DO  Fluticasone-Salmeterol (ADVAIR) 250-50 MCG/DOSE AEPB Inhale 1 puff into the lungs 2 (two) times daily.     Historical Provider, MD  gabapentin (NEURONTIN) 300 MG capsule Take 300 mg by mouth daily as needed (nerve pain).     Historical Provider, MD  HYDROcodone-acetaminophen (NORCO/VICODIN) 5-325 MG per tablet Take 2 tablets by mouth every 6 (six) hours as needed for moderate pain. 04/10/14   Nishant Dhungel, MD  lidocaine (ASPERCREME W/LIDOCAINE) 4 % cream Apply 1 application topically as needed. 03/30/14   Donika K Patel, DO  montelukast (SINGULAIR) 10 MG tablet Take 10 mg by mouth daily as needed (allergies).     Historical Provider, MD  ondansetron (ZOFRAN ODT) 8 MG disintegrating tablet Take 1 tablet (8 mg total) by mouth every 8 (eight) hours as needed for nausea or vomiting. 12/21/13   Barton Dubois, MD  polyethylene glycol (MIRALAX / GLYCOLAX) packet Take 17 g by mouth daily. Patient taking differently: Take 17 g by mouth daily as needed for mild constipation.  12/21/13   Barton Dubois, MD  tadalafil (CIALIS) 10 MG tablet Take 10 mg by mouth daily as needed for erectile dysfunction (erectile dysfunction).  06/26/13   Historical Provider, MD  terbinafine (LAMISIL) 250 MG tablet Take 250 mg by mouth daily.  01/22/14   Historical Provider, MD  Vitamin D, Ergocalciferol, (DRISDOL) 50000 UNITS CAPS capsule Take 50,000 Units by mouth every 7 (seven) days.  06/28/13   Historical Provider, MD   BP 135/83 mmHg  Pulse 112  Temp(Src) 98.4 F (36.9 C) (Oral)  Resp 20  SpO2 95% Physical Exam  Constitutional: He is oriented to person, place, and time. He appears well-developed and well-nourished. No distress.  Obese  HENT:  Head: Normocephalic and atraumatic.  Mouth/Throat: Oropharynx is clear and moist.  Eyes: EOM are normal. Pupils are equal, round, and reactive to light.  Neck: Normal range of motion. Neck supple.  Cardiovascular: Normal rate and regular rhythm.   Pulmonary/Chest: Effort normal and breath sounds normal. No respiratory distress. He has no wheezes. He has no rales.  Abdominal: Soft. Bowel sounds are normal. He exhibits no distension and no mass. There is tenderness (tenderness to palpation especially in  the right and left lower quadrants. No definite rebound or guarding.). There is no rebound and no guarding.  Musculoskeletal: Normal range of motion. He exhibits no edema or tenderness.  Neurological: He is alert and oriented to person, place, and time.  Skin: Skin is warm and dry. No rash noted. No erythema.  Psychiatric: He has a normal mood and affect. His behavior is normal.  Nursing note and vitals reviewed.   ED Course  Procedures (including critical care time) Labs Review Labs Reviewed  CBC WITH DIFFERENTIAL/PLATELET  COMPREHENSIVE METABOLIC PANEL  LIPASE, BLOOD  TYPE AND SCREEN    Imaging Review No results found.   EKG Interpretation None      MDM   Final diagnoses:  Abdominal pain   Signed out to oncoming emergency physician pending CT.     Julianne Rice, MD 06/15/14 [phone]

## 2014-06-11 NOTE — Progress Notes (Signed)
Orders received from Dr. Wynelle Cleveland to allow patient to use his own Advair 250/50 and albuterol HFA inhalers.    RN had these at the nurses' station.  Inhalers were inspected - they are the correct medication as described above and are in date.  Medication orders have been entered.  Clayburn Pert, PharmD, BCPS Pager: [phone] 06/11/2014  1:22 PM

## 2014-06-11 NOTE — H&P (Signed)
[facility] History and Physical  Nathaniel Chambers GBE:[id number] DOB: Sep 23, 1965 DOA: 06/11/2014   PCP: [name], MD    Chief Complaint: vomiting, diarrhea and abdominal pain  HPI: Nathaniel Chambers is a 49 y.o. male with morbid obesity, asthma and multiple episodes of diverticulitis in the past presents for abdominal pain, vomiting and diarrhea. He has been on Augmentin as outpt for a recent episode of diverticulitis and is over a week into his treatment. Symptoms mentioned above started this morning and he is unable to keep down his Augmentin today. He is being admitted for IV antibiotics. CT of the abd/pelvis reveals improved but persistent sigmoid diverticulitis. Surgery is following him for this as outpt and has been consulted in the hospital as well.    General: The patient denies anorexia, fever, weight loss Cardiac: Denies chest pain, syncope, palpitations, pedal edema  Respiratory:+ dry cough, + shortness of breath, + wheezing- sometimes forgets to take his advair GI: Denies severe indigestion/heartburn, + abdominal pain nausea, vomiting and diarrhea  GU: Denies hematuria, incontinence, dysuria  Musculoskeletal: "whole body aches" Skin: Denies suspicious skin lesions Neurologic: Denies focal weakness or numbness, change in vision Psychiatry: Denies depression or anxiety. Hematologic: no easy bruising or bleeding   Past Medical History  Diagnosis Date  . Asthma   . Pneumonia     history of  . 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 Leonides Schanz  . Meralgia paraesthetica March 2015    Bilaterally  . Diverticulitis     Past Surgical History  Procedure Laterality Date  . Laparoscopic gastric banding  04-26-2007  . Repair recurrent right inguinal hernia  10-22-2008  . Surg. for undescended testicles/ bilateral inguinal hernia repair  AGE 48 MON OLD  . Left shoulder surg  2000    rotator cuff  . Hernia repair  08/2008    rt inguinal  . Shoulder arthroscopy   01/2009    rt   . Shoulder arthroscopy  07/01/2011    Procedure: ARTHROSCOPY SHOULDER;  Surgeon: Magnus Sinning, MD;  Location: [facility];  Service: Orthopedics;  Laterality: Left;  WITH LABRAL DEBRIDEMENT  . Shoulder open rotator cuff repair  07/01/2011    Procedure: ROTATOR CUFF REPAIR SHOULDER OPEN;  Surgeon: Magnus Sinning, MD;  Location: [place];  Service: Orthopedics;  Laterality: Left;  OPEN DISTAL CLAVICLE REPAIR  . Vasectomy    . 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     Allergies  Allergen Reactions  . Penicillins Shortness Of Breath    Swelling of uvula  . Solu-Medrol [Methylprednisolone Acetate] Anaphylaxis  . Dilaudid [Hydromorphone Hcl] Nausea And Vomiting  . Oxycodone Nausea And Vomiting  . Percocet [Oxycodone-Acetaminophen] Nausea And Vomiting    Family History  Problem Relation Age of Onset  . Heart disease Father   . Cancer Mother     kidney, spleen     Prior to Admission medications   Medication Sig Start Date End Date Taking? Authorizing Provider  albuterol (PROVENTIL HFA;VENTOLIN HFA) 108 (90 BASE) MCG/ACT inhaler Inhale 2 puffs into the lungs every 6 (six) hours as needed for wheezing or shortness of breath (wheezing).    Yes Historical Provider, MD  clotrimazole-betamethasone (LOTRISONE) cream Apply 1 application topically 2 (two) times daily as needed (rash).  02/06/14  Yes Historical Provider, MD  Fluticasone-Salmeterol (ADVAIR) 250-50 MCG/DOSE AEPB Inhale  1 puff into the lungs 2 (two) times daily.    Yes Historical Provider, MD  gabapentin (NEURONTIN) 300 MG capsule Take 300 mg by mouth daily as needed (nerve pain).   Yes Historical Provider, MD  HYDROcodone-acetaminophen (NORCO/VICODIN) 5-325 MG per tablet Take 2 tablets by mouth every 6 (six) hours as needed for moderate pain. 04/10/14  Yes Nishant Dhungel, MD  polyethylene glycol (MIRALAX /  GLYCOLAX) packet Take 17 g by mouth daily. Patient taking differently: Take 17 g by mouth daily as needed for mild constipation.  12/21/13  Yes Barton Dubois, MD  Vitamin D, Ergocalciferol, (DRISDOL) 50000 UNITS CAPS capsule Take 50,000 Units by mouth every 7 (seven) days.  06/28/13  Yes Historical Provider, MD  DULoxetine (CYMBALTA) 30 MG capsule Take 1 capsule (30 mg total) by mouth daily. 03/30/14   Donika Keith Rake, DO  lidocaine (ASPERCREME W/LIDOCAINE) 4 % cream Apply 1 application topically as needed. Patient not taking: Reported on 06/11/2014 03/30/14   Donika K Patel, DO  ondansetron (ZOFRAN ODT) 8 MG disintegrating tablet Take 1 tablet (8 mg total) by mouth every 8 (eight) hours as needed for nausea or vomiting. Patient not taking: Reported on 06/11/2014 12/21/13   Barton Dubois, MD  tadalafil (CIALIS) 10 MG tablet Take 10 mg by mouth daily as needed for erectile dysfunction (erectile dysfunction).  06/26/13   Historical Provider, MD     Physical Exam: Filed Vitals:   06/11/14 3976 06/11/14 0953 06/11/14 1056 06/11/14 1100  BP:  120/73 133/80 134/78  Pulse: 98 98 93 93  Temp:      TempSrc:      Resp:  21 18   Weight:      SpO2: 98% 95% 99% 98%     General: AAO x 3 HEENT: Normocephalic and Atraumatic, Mucous membranes pink                PERRLA; EOM intact; No scleral icterus,                 Nares: Patent, Oropharynx: Clear, Fair Dentition                 Neck: FROM, no cervical lymphadenopathy, thyromegaly, carotid bruit or JVD;  Breasts: deferred CHEST WALL: No tenderness  CHEST: Normal respiration, clear to auscultation bilaterally  HEART: Regular rate and rhythm; no murmurs rubs or gallops  BACK: No kyphosis or scoliosis; no CVA tenderness  GI: Positive Bowel Sounds, soft, mild diffuse tenderness - morbidly obese Rectal Exam: deferred MSK: No cyanosis, clubbing, or edema Genitalia: not examined  SKIN:  no rash or ulceration  CNS: Alert and Oriented x 4, Nonfocal exam, CN  2-12 intact  Labs on Admission:  Basic Metabolic Panel:  Recent Labs Lab 06/11/14 0519  NA 137  K 4.3  CL 100  CO2 29  GLUCOSE 105*  BUN 12  CREATININE 1.30  CALCIUM 9.7   Liver Function Tests:  Recent Labs Lab 06/11/14 0519  AST 20  ALT 25  ALKPHOS 71  BILITOT 0.6  PROT 7.9  ALBUMIN 4.3    Recent Labs Lab 06/11/14 0519  LIPASE 28   No results for input(s): AMMONIA in the last 168 hours. CBC:  Recent Labs Lab 06/11/14 0519  WBC 12.8*  NEUTROABS 10.8*  HGB 15.7  HCT 48.0  MCV 88.1  PLT 322   Cardiac Enzymes: No results for input(s): CKTOTAL, CKMB, CKMBINDEX, TROPONINI in the last 168 hours.  BNP (last 3 results) No results for  input(s): BNP in the last 8760 hours.  ProBNP (last 3 results) No results for input(s): PROBNP in the last 8760 hours.  CBG: No results for input(s): GLUCAP in the last 168 hours.  Radiological Exams on Admission: Ct Abdomen Pelvis W Contrast  06/11/2014   CLINICAL DATA:  Diverticulitis diagnosed November 2015. Mid abdominal pain and nausea for 2 weeks.  EXAM: CT ABDOMEN AND PELVIS WITH CONTRAST  TECHNIQUE: Multidetector CT imaging of the abdomen and pelvis was performed using the standard protocol following bolus administration of intravenous contrast.  CONTRAST:  168mL OMNIPAQUE IOHEXOL 300 MG/ML  SOLN  COMPARISON:  05/31/2014 the [facility]  FINDINGS: BODY WALL: Fatty bilateral inguinal hernias.  LOWER CHEST: Unremarkable.  ABDOMEN/PELVIS:  Liver: Tiny presumed cyst in the deep left liver.  Biliary: No evidence of biliary obstruction or stone.  Pancreas: Unremarkable.  Spleen: Unremarkable.  Adrenals: Unremarkable.  Kidneys and ureters: No hydronephrosis or stone.  Bladder: Unremarkable.  Reproductive: Unremarkable.  Bowel: Lap band which is in stable position. The stomach is distended, but there is no visible gastric outlet obstruction. No concerning wall thickening around the lap band.  Persistent inflammatory change  and wall thickening of the mid sigmoid colon, the site of contained perforation on previous CT. The previously gas-filled cavity has collapsed and inflammation has mildly decreased.  Retroperitoneum: No mass or adenopathy.  Peritoneum: No ascites or pneumoperitoneum.  Vascular: No acute abnormality.  OSSEOUS: No acute abnormalities. L5-S1 degenerative disc narrowing and retrolisthesis.  IMPRESSION: Mild improvement of sigmoid diverticulitis since 05/31/2014. The previously noted contained perforation has resolved/collapsed.   Electronically Signed   By: Monte Fantasia M.D.   On: 06/11/2014 08:39     Assessment/Plan Principal Problem:   Diverticulitis- with new onset vomiting, diarrhea and increase abd pain- mild leukocytosis - will start Cipro and Flagyl- c diff is ordered and pending - clear liquids only- IVF as well - IV Morphine and oral Vicodin as tolerated for pain- - Active Problems:   Asthma - cont inhalers- wants to take his home inhalers    Morbid Obesity    Consulted: surgery  Code Status: full code  Family Communication:   DVT Prophylaxis:Lovenox  Time spent: 58 min  [place], MD [facility]  If 7PM-7AM, please contact night-coverage [email or web address] 06/11/2014, 12:18 PM

## 2014-06-11 NOTE — ED Notes (Signed)
PICC line no longer in place

## 2014-06-11 NOTE — Consult Note (Signed)
[facility] Surgery Consult Note  Nathaniel Chambers Apr 16, 1966  [id number].    Requesting MD: Dr. Ashok Cordia Chief Complaint/Reason for Consult: recurrent diverticulitis  HPI:  49 y/o obese (BMI 57) AA male with PMH lap band procedure, h/o asthma, soft tissue cancer posterior thigh presents to [facility] with severe abdominal pain which has become constant since yesterday.  He has had chronic abdominal pain almost daily since May 2015 due to diverticulitis.  He was hospitalized for diverticulitis in August 2015, and most recently in December 2015 and was treated with Invanz.  He required IV antibiotics at discharge.  He had a colonoscopy by Dr. Michail Sermon in July or August 2015.  Reportedly there were some polyps which were normal, no cancer.    He most recently followed in the office with Dr. Hassell Done (05/31/2014) who saw him last week and started him on Augmentin.  He thought he saw some improvement, but today it got progressively worse.  He has pain periumbilical and LLQ.  He c/o N/V and diarrhea.  Notes a small amount of blood on the toilet tissue, denies hemorrhoids.  He notes chills, but no temp (99*F).  He notes weakness.  No chest pain, but SOB has progressively worsened recently due to asthma, but he doesn't know why.  He notes up and down weight, but no significant gain/loss.  No alleviating factors, but positions and eating makes the pain worse.  He's scared to eat anything because anything he eats bothers his stomach.    CT today again shows consistent with diverticulitis although improved from most recent CT.   Prior GI history:  1  Open repair of recurrent right inguinal hernia - 08/26/2008 - [place]  2.  Lap band, APL  -  04/16/2007 - [place] His weight was 328 in 12/2013.  His most recent weight was 337.  ROS: All systems reviewed and otherwise negative except for as above  Family History  Problem Relation Age of Onset  . Heart disease Father   . Cancer Mother     kidney, spleen    Past  Medical History  Diagnosis Date  . Asthma   . Pneumonia     history of  . 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 Leonides Schanz  . Meralgia paraesthetica March 2015    Bilaterally  . Diverticulitis     Past Surgical History  Procedure Laterality Date  . Laparoscopic gastric banding  04-26-2007  . Repair recurrent right inguinal hernia  10-22-2008  . Surg. for undescended testicles/ bilateral inguinal hernia repair  AGE 34 MON OLD  . Left shoulder surg  2000    rotator cuff  . Hernia repair  08/2008    rt inguinal  . Shoulder arthroscopy  01/2009    rt   . Shoulder arthroscopy  07/01/2011    Procedure: ARTHROSCOPY SHOULDER;  Surgeon: Magnus Sinning, MD;  Location: [facility];  Service: Orthopedics;  Laterality: Left;  WITH LABRAL DEBRIDEMENT  . Shoulder open rotator cuff repair  07/01/2011    Procedure: ROTATOR CUFF REPAIR SHOULDER OPEN;  Surgeon: Magnus Sinning, MD;  Location: [place];  Service: Orthopedics;  Laterality: Left;  OPEN DISTAL CLAVICLE REPAIR  . Vasectomy    . Leg surgery      cancer    Social History:  reports that he quit smoking about 25 years ago. He has quit using smokeless tobacco. He reports that he does not drink alcohol or use illicit drugs.  Allergies:  Allergies  Allergen Reactions  . Penicillins Shortness Of Breath    Swelling of uvula  . Solu-Medrol [Methylprednisolone Acetate] Anaphylaxis  . Dilaudid [Hydromorphone Hcl] Nausea And Vomiting  . Oxycodone Nausea And Vomiting  . Percocet [Oxycodone-Acetaminophen] Nausea And Vomiting     (Not in a hospital admission)  Blood pressure 134/78, pulse 93, temperature 98.4 F (36.9 C), temperature source Oral, resp. rate 18, weight 340 lb (154.223 kg), SpO2 98 %.   Physical Exam: General: pleasant, WD/WN AA obese male who is laying in bed in NAD HEENT: head is normocephalic, atraumatic.  Sclera are noninjected.  PERRL.  Ears and nose without any masses or lesions.   Mouth is pink and moist Heart: regular, rate, and rhythm.  No obvious murmurs, gallops, or rubs noted.  Palpable pedal pulses bilaterally Lungs: CTAB, no wheezes, rhonchi, or rales noted.  Respiratory effort nonlabored Abd: soft, distended, tender throughout, but most severely at periumbilical and LLQ, +BS, no masses, or organomegaly, b/l inguinal hernias are soft. MS: all 4 extremities are symmetrical with no cyanosis, clubbing, or edema. Skin: warm and dry with no masses, lesions, or rashes Psych: A&Ox3 with an appropriate affect.   Results for orders placed or performed during the hospital encounter of 06/11/14 (from the past 48 hour(s))  CBC with Differential/Platelet     Status: Abnormal   Collection Time: 06/11/14  5:19 AM  Result Value Ref Range   WBC 12.8 (H) 4.0 - 10.5 K/uL   RBC 5.45 4.22 - 5.81 MIL/uL   Hemoglobin 15.7 13.0 - 17.0 g/dL   HCT 48.0 39.0 - 52.0 %   MCV 88.1 78.0 - 100.0 fL   MCH 28.8 26.0 - 34.0 pg   MCHC 32.7 30.0 - 36.0 g/dL   RDW 14.3 11.5 - 15.5 %   Platelets 322 150 - 400 K/uL   Neutrophils Relative % 84 (H) 43 - 77 %   Neutro Abs 10.8 (H) 1.7 - 7.7 K/uL   Lymphocytes Relative 10 (L) 12 - 46 %   Lymphs Abs 1.3 0.7 - 4.0 K/uL   Monocytes Relative 5 3 - 12 %   Monocytes Absolute 0.6 0.1 - 1.0 K/uL   Eosinophils Relative 1 0 - 5 %   Eosinophils Absolute 0.1 0.0 - 0.7 K/uL   Basophils Relative 0 0 - 1 %   Basophils Absolute 0.0 0.0 - 0.1 K/uL  Comprehensive metabolic panel     Status: Abnormal   Collection Time: 06/11/14  5:19 AM  Result Value Ref Range   Sodium 137 135 - 145 mmol/L   Potassium 4.3 3.5 - 5.1 mmol/L   Chloride 100 96 - 112 mmol/L   CO2 29 19 - 32 mmol/L   Glucose, Bld 105 (H) 70 - 99 mg/dL   BUN 12 6 - 23 mg/dL   Creatinine, Ser 1.30 0.50 - 1.35 mg/dL   Calcium 9.7 8.4 - 10.5 mg/dL   Total Protein 7.9 6.0 - 8.3 g/dL   Albumin 4.3 3.5 - 5.2 g/dL   AST 20 0 - 37 U/L   ALT 25 0 - 53 U/L   Alkaline Phosphatase 71 39 - 117 U/L    Total Bilirubin 0.6 0.3 - 1.2 mg/dL   GFR calc non Af Amer 64 (L) >90 mL/min   GFR calc Af Amer 74 (L) >90 mL/min    Comment: (NOTE) The eGFR has been calculated using the CKD EPI equation. This calculation has not been validated in all clinical situations.  eGFR's persistently <90 mL/min signify possible Chronic Kidney Disease.    Anion gap 8 5 - 15  Lipase, blood     Status: None   Collection Time: 06/11/14  5:19 AM  Result Value Ref Range   Lipase 28 11 - 59 U/L  Type and screen     Status: None   Collection Time: 06/11/14  5:19 AM  Result Value Ref Range   ABO/RH(D) A POS    Antibody Screen NEG    Sample Expiration 06/14/2014   ABO/Rh     Status: None   Collection Time: 06/11/14  5:20 AM  Result Value Ref Range   ABO/RH(D) A POS    Ct Abdomen Pelvis W Contrast  06/11/2014   CLINICAL DATA:  Diverticulitis diagnosed November 2015. Mid abdominal pain and nausea for 2 weeks.  EXAM: CT ABDOMEN AND PELVIS WITH CONTRAST  TECHNIQUE: Multidetector CT imaging of the abdomen and pelvis was performed using the standard protocol following bolus administration of intravenous contrast.  CONTRAST:  144m OMNIPAQUE IOHEXOL 300 MG/ML  SOLN  COMPARISON:  05/31/2014 the [facility]  FINDINGS: BODY WALL: Fatty bilateral inguinal hernias.  LOWER CHEST: Unremarkable.  ABDOMEN/PELVIS:  Liver: Tiny presumed cyst in the deep left liver.  Biliary: No evidence of biliary obstruction or stone.  Pancreas: Unremarkable.  Spleen: Unremarkable.  Adrenals: Unremarkable.  Kidneys and ureters: No hydronephrosis or stone.  Bladder: Unremarkable.  Reproductive: Unremarkable.  Bowel: Lap band which is in stable position. The stomach is distended, but there is no visible gastric outlet obstruction. No concerning wall thickening around the lap band.  Persistent inflammatory change and wall thickening of the mid sigmoid colon, the site of contained perforation on previous CT. The previously gas-filled cavity has  collapsed and inflammation has mildly decreased.  Retroperitoneum: No mass or adenopathy.  Peritoneum: No ascites or pneumoperitoneum.  Vascular: No acute abnormality.  OSSEOUS: No acute abnormalities. L5-S1 degenerative disc narrowing and retrolisthesis.  IMPRESSION: Mild improvement of sigmoid diverticulitis since 05/31/2014. The previously noted contained perforation has resolved/collapsed.   Electronically Signed   By: JMonte FantasiaM.D.   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  Admit to medicine, we will consult.   2.  NPO, bowel rest, IVF, pain control, antiemetics, antibiotics (started on cipro/flagyl, but may need switched to invanz since that is what he required in December 2015 at home) 3.  SCD's and VTE proph per medicine 4.  Ambulate and IS 5.  May need surgery this admission given smoldering recurrent nature of this diverticulitis.  His pain is significant despite some improvement from the last CT on 05/31/14.  The previously noted contained perforation has resolved/collapsed.      DCoralie Keens PAkron Children'S HospitalSurgery 06/11/2014, 11:59 AM Pager: [phone] Agree with above. Difficult patient who has had long standing problem by looking at the chart.  DAlphonsa Overall MD, FAdventist Health And Rideout Memorial HospitalSurgery Pager: [phone] phone:  [phone]

## 2014-06-11 NOTE — ED Notes (Addendum)
Pt aware of the need for a stool sample. Pt and family also updated about awaiting consult for general surgeon.  Mouth swab provided for mouth dryness

## 2014-06-11 NOTE — ED Notes (Signed)
Patient with hx of recurrent diverticulitis which was dx in November 2015 Patient admitted to [facility] for diverticulitis and was DC on 04/11/15 with a PICC line for IV abx x 14 days Patient had f/u CT scan on 2/4 which showed an improvement from previous CT scan Patient here in ED with c/o abdominal pain, N/VD since last night Patient reports hematemesis as well Patient is not actively vomiting upon arrival from triage  Patient alert and oriented x 4

## 2014-06-11 NOTE — ED Notes (Signed)
Protonix infusion completed, see MAR Patient resting comfortably with eyes closed after admin of multiple medications, see MAR Patient awakened when disconnecting IV tubing for Protonix Patient informed that he needed to attempt to drink PO contrast for pending CT--patient stated that he was unable to, but then fell back to sleep Patient on cardiac monitor with cycling BP and pulse ox

## 2014-06-12 DIAGNOSIS — K5780 Diverticulitis of intestine, part unspecified, with perforation and abscess without bleeding: Secondary | ICD-10-CM

## 2014-06-12 LAB — BASIC METABOLIC PANEL
Anion gap: 3 — ABNORMAL LOW (ref 5–15)
BUN: 10 mg/dL (ref 6–23)
CO2: 24 mmol/L (ref 19–32)
Calcium: 8.3 mg/dL — ABNORMAL LOW (ref 8.4–10.5)
Chloride: 110 mmol/L (ref 96–112)
Creatinine, Ser: 1.17 mg/dL (ref 0.50–1.35)
GFR calc Af Amer: 84 mL/min — ABNORMAL LOW (ref >90)
GFR, EST NON AFRICAN AMERICAN: 72 mL/min — AB (ref >90)
Glucose, Bld: 99 mg/dL (ref 70–99)
Potassium: 3.4 mmol/L — ABNORMAL LOW (ref 3.5–5.1)
SODIUM: 137 mmol/L (ref 135–145)

## 2014-06-12 LAB — CBC
HEMATOCRIT: 41.8 % (ref 39.0–52.0)
HEMOGLOBIN: 13.1 g/dL (ref 13.0–17.0)
MCH: 28 pg (ref 26.0–34.0)
MCHC: 31.3 g/dL (ref 30.0–36.0)
MCV: 89.3 fL (ref 78.0–100.0)
Platelets: 260 10*3/uL (ref 150–400)
RBC: 4.68 MIL/uL (ref 4.22–5.81)
RDW: 14.8 % (ref 11.5–15.5)
WBC: 7.7 10*3/uL (ref 4.0–10.5)

## 2014-06-12 MED ORDER — PROMETHAZINE HCL 25 MG/ML IJ SOLN
6.2500 mg | Freq: Four times a day (QID) | INTRAMUSCULAR | Status: DC | PRN
  Administered 2014-06-13 – 2014-06-14 (×2): 12.5 mg via INTRAVENOUS
  Administered 2014-06-15: 6.25 mg via INTRAVENOUS
  Administered 2014-06-16: 12.5 mg via INTRAVENOUS
  Filled 2014-06-12 (×4): qty 1

## 2014-06-12 MED ORDER — POTASSIUM CHLORIDE CRYS ER 20 MEQ PO TBCR
40.0000 meq | EXTENDED_RELEASE_TABLET | ORAL | Status: AC
  Administered 2014-06-12 (×2): 40 meq via ORAL
  Filled 2014-06-12 (×2): qty 2

## 2014-06-12 MED ORDER — PROMETHAZINE HCL 25 MG/ML IJ SOLN
25.0000 mg | Freq: Four times a day (QID) | INTRAMUSCULAR | Status: DC | PRN
  Administered 2014-06-12: 25 mg via INTRAVENOUS
  Filled 2014-06-12: qty 1

## 2014-06-12 NOTE — Care Management Note (Addendum)
    Page 1 of 1   06/15/2014     11:03:06 AM CARE MANAGEMENT NOTE 06/15/2014  Patient:  Nathaniel Chambers, Nathaniel Chambers   Account Number:  [id number]  Date Initiated:  06/12/2014  Documentation initiated by:  Sunday Spillers  Subjective/Objective Assessment:   49 yo male admitted with diverticulitis. PTA lived at home with spouse.     Action/Plan:   Home when stable   Anticipated DC Date:  06/15/2014   Anticipated DC Plan:  [place]  CM consult      [facility] Choice  HOME HEALTH   Choice offered to / List presented to:  C-1 Patient        [place] arranged  HH-1 RN  [place].   Status of service:  Completed, signed off Medicare Important Message given?   (If response is "NO", the following Medicare IM given date fields will be blank) Date Medicare IM given:   Medicare IM given by:   Date Additional Medicare IM given:   Additional Medicare IM given by:    Discharge Disposition:  [place]  Per UR Regulation:  Reviewed for med. necessity/level of care/duration of stay  If discussed at [place] of Stay Meetings, dates discussed:    Comments:  06-15-14 Sunday Spillers RN CM 1100 Spoke with patient at bedside. States has used [facility] in the past and would like to use them again. Contacted [facility] to arrange, awaiting final orders and PICC placement.

## 2014-06-12 NOTE — Progress Notes (Signed)
[facility] Progress Note     Subjective: Pt says pain still present, but some improved.  No N/V.  Ambulating OOB, having flatus and BM.    Objective: Vital signs in last 24 hours: Temp:  [98.2 F (36.8 C)-98.8 F (37.1 C)] 98.2 F (36.8 C) (02/16 0528) Pulse Rate:  [72-98] 72 (02/16 0528) Resp:  [16-21] 18 (02/16 0528) BP: (120-148)/(68-94) 134/75 mmHg (02/16 0528) SpO2:  [95 %-99 %] 96 % (02/16 0528) Weight:  [330 lb (149.687 kg)] 330 lb (149.687 kg) (02/15 1230) Last BM Date: 06/11/14  Intake/Output from previous day: 02/15 0701 - 02/16 0700 In: 1659.6 [I.V.:1659.6] Out: 201 [Urine:200; Emesis/NG output:1] Intake/Output this shift:    PE: Gen:  Alert, NAD, pleasant Abd: Soft, obese, tender to palpation in the suprapubic and LLQ, +BS, no HSM   Lab Results:   Recent Labs  06/11/14 0519 06/12/14 0515  WBC 12.8* 7.7  HGB 15.7 13.1  HCT 48.0 41.8  PLT 322 260   BMET  Recent Labs  06/11/14 0519 06/12/14 0515  NA 137 137  K 4.3 3.4*  CL 100 110  CO2 29 24  GLUCOSE 105* 99  BUN 12 10  CREATININE 1.30 1.17  CALCIUM 9.7 8.3*   PT/INR No results for input(s): LABPROT, INR in the last 72 hours. CMP     Component Value Date/Time   NA 137 06/12/2014 0515   K 3.4* 06/12/2014 0515   CL 110 06/12/2014 0515   CO2 24 06/12/2014 0515   GLUCOSE 99 06/12/2014 0515   BUN 10 06/12/2014 0515   CREATININE 1.17 06/12/2014 0515   CALCIUM 8.3* 06/12/2014 0515   PROT 7.9 06/11/2014 0519   ALBUMIN 4.3 06/11/2014 0519   AST 20 06/11/2014 0519   ALT 25 06/11/2014 0519   ALKPHOS 71 06/11/2014 0519   BILITOT 0.6 06/11/2014 0519   GFRNONAA 72* 06/12/2014 0515   GFRAA 84* 06/12/2014 0515   Lipase     Component Value Date/Time   LIPASE 28 06/11/2014 0519       Studies/Results: Ct Abdomen Pelvis W Contrast  06/11/2014   CLINICAL DATA:  Diverticulitis diagnosed November 2015. Mid abdominal pain and nausea for 2 weeks.  EXAM: CT ABDOMEN AND PELVIS  WITH CONTRAST  TECHNIQUE: Multidetector CT imaging of the abdomen and pelvis was performed using the standard protocol following bolus administration of intravenous contrast.  CONTRAST:  156mL OMNIPAQUE IOHEXOL 300 MG/ML  SOLN  COMPARISON:  05/31/2014 [facility]  FINDINGS: BODY WALL: Fatty bilateral inguinal hernias.  LOWER CHEST: Unremarkable.  ABDOMEN/PELVIS:  Liver: Tiny presumed cyst in the deep left liver.  Biliary: No evidence of biliary obstruction or stone.  Pancreas: Unremarkable.  Spleen: Unremarkable.  Adrenals: Unremarkable.  Kidneys and ureters: No hydronephrosis or stone.  Bladder: Unremarkable.  Reproductive: Unremarkable.  Bowel: Lap band which is in stable position. The stomach is distended, but there is no visible gastric outlet obstruction. No concerning wall thickening around the lap band.  Persistent inflammatory change and wall thickening of the mid sigmoid colon, the site of contained perforation on previous CT. The previously gas-filled cavity has collapsed and inflammation has mildly decreased.  Retroperitoneum: No mass or adenopathy.  Peritoneum: No ascites or pneumoperitoneum.  Vascular: No acute abnormality.  OSSEOUS: No acute abnormalities. L5-S1 degenerative disc narrowing and retrolisthesis.  IMPRESSION: Mild improvement of sigmoid diverticulitis since 05/31/2014. The previously noted contained perforation has resolved/collapsed.   Electronically Signed   By: Neva Seat.D.  On: 06/11/2014 08:39    Anti-infectives: Anti-infectives    Start     Dose/Rate Route Frequency Ordered Stop   06/11/14 1200  ciprofloxacin (CIPRO) IVPB 400 mg     400 mg 200 mL/hr over 60 Minutes Intravenous Every 12 hours 06/11/14 1152     06/11/14 1200  metroNIDAZOLE (FLAGYL) IVPB 500 mg     500 mg 100 mL/hr over 60 Minutes Intravenous Every 8 hours 06/11/14 1152     06/11/14 1045  ciprofloxacin (CIPRO) IVPB 400 mg     400 mg 200 mL/hr over 60 Minutes Intravenous  Once 06/11/14  1040 06/11/14 1153   06/11/14 1045  metroNIDAZOLE (FLAGYL) IVPB 500 mg     500 mg 100 mL/hr over 60 Minutes Intravenous  Once 06/11/14 1040 06/11/14 1153       Assessment/Plan Smoldering recurrent diverticulitis  On Cipro/Flagyl - 2/15 >>> Leukocytosis - resolved 7.7 Obesity BMI 43 H/o lap band 2009 H/o Asthma 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 NPO, bowel rest, IVF, pain control, antiemetics, antibiotics (started on cipro/flagyl Day #2, but may need switched to invanz since that is what he required in December 2015 at home).  2. SCD's and VTE proph per medicine 3. Ambulate and IS 4. Pain is out of proportion to the CT findings.  May not likely need urgent surgery while he's here, but he will need surgery to prevent this from re-occuring. The  goal would be to get him through this episode with close follow up and schedule to surgery in 6-10 weeks from now.  Recent colonoscopy done by Dr. Michail Sermon in July 2015, but may need updated one.    LOS: 1 day    Coralie Keens 06/12/2014, 7:51 AM Pager: [phone]  Agree with above. His pain seems out of proportion to what we are seeing on the CT scan - the contained perf seen on the CT scan from 05/31/2014 is better. Will discuss with Dr. Hassell Done, but will probably need an elective sigmoid colon resection.  Alphonsa Overall, MD, [facility] Surgery Pager: [phone] Office phone:  [phone]

## 2014-06-12 NOTE — Progress Notes (Addendum)
[facility] Progress Note   Nathaniel Chambers RWE:[id number] DOB: 1965-12-01 DOA: 06/11/2014 PCP: [name], MD  Brief narrative: Nathaniel Chambers is a 49 y.o. male with morbid obesity, asthma and multiple episodes of diverticulitis in the past presents for abdominal pain, vomiting and diarrhea.    Subjective: Continues to have watery stools, multiple episodes along with diffuse abdominal pain. No vomiting.   Assessment/Plan: Principal Problem:   Diverticulitis- Leukocytosis - recent CT suggestive of microperforation- repeat CT reveals resolution of this but continued diverticulitis - this current exacerbation of pain and diarrhea/vomiting may be a failure to respond to oral meds (Augmentin) vs development of C diff colitis-  - C diff PCR is pending - on Cipro and Flagyl and clear liquids- cont IVF as well - surgery following as well as they have been managing his frequent episodes of Diverticulitis as outpt and have considered surgery if patient is not responding to antibiotics.  Active Problems:   Asthma - controlled with inhalers    Obesity Body mass index is 42.35 kg/(m^2).   Code Status: Full code Family Communication:  Disposition Plan: home when stable DVT prophylaxis: Lovenox  Consultants: surgery  Procedures:   Antibiotics: Anti-infectives    Start     Dose/Rate Route Frequency Ordered Stop   06/11/14 1200  ciprofloxacin (CIPRO) IVPB 400 mg     400 mg 200 mL/hr over 60 Minutes Intravenous Every 12 hours 06/11/14 1152     06/11/14 1200  metroNIDAZOLE (FLAGYL) IVPB 500 mg     500 mg 100 mL/hr over 60 Minutes Intravenous Every 8 hours 06/11/14 1152     06/11/14 1045  ciprofloxacin (CIPRO) IVPB 400 mg     400 mg 200 mL/hr over 60 Minutes Intravenous  Once 06/11/14 1040 06/11/14 1153   06/11/14 1045  metroNIDAZOLE (FLAGYL) IVPB 500 mg     500 mg 100 mL/hr over 60 Minutes Intravenous  Once 06/11/14 1040 06/11/14 1153         Objective: Filed  Weights   06/11/14 0606 06/11/14 1230  Weight: 154.223 kg (340 lb) 149.687 kg (330 lb)    Intake/Output Summary (Last 24 hours) at 06/12/14 1144 Last data filed at 06/12/14 1037  Gross per 24 hour  Intake 1659.58 ml  Output    501 ml  Net 1158.58 ml     Vitals Filed Vitals:   06/11/14 2039 06/11/14 2145 06/12/14 0528 06/12/14 0941  BP:  148/94 134/75   Pulse:  88 72   Temp:  98.8 F (37.1 C) 98.2 F (36.8 C)   TempSrc:  Oral Oral   Resp:  20 18   Height:      Weight:      SpO2: 96% 96% 96% 95%    Exam: General: AAO x 3, No acute respiratory distress Lungs: Clear to auscultation bilaterally without wheezes or crackles Cardiovascular: Regular rate and rhythm without murmur gallop or rub normal S1 and S2 Abdomen: tender across the mid and lower abdomen, nondistended, soft, bowel sounds positive, no rebound, no ascites, no appreciable mass Extremities: No significant cyanosis, clubbing, or edema bilateral lower extremities  Data Reviewed: Basic Metabolic Panel:  Recent Labs Lab 06/11/14 0519 06/12/14 0515  NA 137 137  K 4.3 3.4*  CL 100 110  CO2 29 24  GLUCOSE 105* 99  BUN 12 10  CREATININE 1.30 1.17  CALCIUM 9.7 8.3*   Liver Function Tests:  Recent Labs Lab 06/11/14 0519  AST 20  ALT 25  ALKPHOS  71  BILITOT 0.6  PROT 7.9  ALBUMIN 4.3    Recent Labs Lab 06/11/14 0519  LIPASE 28   No results for input(s): AMMONIA in the last 168 hours. CBC:  Recent Labs Lab 06/11/14 0519 06/12/14 0515  WBC 12.8* 7.7  NEUTROABS 10.8*  --   HGB 15.7 13.1  HCT 48.0 41.8  MCV 88.1 89.3  PLT 322 260   Cardiac Enzymes: No results for input(s): CKTOTAL, CKMB, CKMBINDEX, TROPONINI in the last 168 hours. BNP (last 3 results) No results for input(s): BNP in the last 8760 hours.  ProBNP (last 3 results) No results for input(s): PROBNP in the last 8760 hours.  CBG:  Recent Labs Lab 06/11/14 2143  GLUCAP 82    No results found for this or any previous  visit (from the past 240 hour(s)).   Studies:  Recent x-ray studies have been reviewed in detail by the Attending Physician  Scheduled Meds:  Scheduled Meds: . ciprofloxacin  400 mg Intravenous Q12H  . enoxaparin (LOVENOX) injection  40 mg Subcutaneous Q24H  . Fluticasone-Salmeterol  1 puff Inhalation BID  . metronidazole  500 mg Intravenous Q8H  . potassium chloride  40 mEq Oral Q4H   Continuous Infusions: . sodium chloride 125 mL/hr at 06/12/14 1194    Time spent on care of this patient: 30 min   [place], MD 06/12/2014, 11:44 AM  LOS: 1 day   [facility] Office  [phone] Pager - Text Page per [email or web address]  If 7PM-7AM, please contact night-coverage [email or web address]

## 2014-06-13 LAB — CLOSTRIDIUM DIFFICILE BY PCR: Toxigenic C. Difficile by PCR: POSITIVE — AB

## 2014-06-13 MED ORDER — VANCOMYCIN 50 MG/ML ORAL SOLUTION
125.0000 mg | Freq: Four times a day (QID) | ORAL | Status: AC
  Administered 2014-06-13 – 2014-06-15 (×7): 125 mg via ORAL
  Filled 2014-06-13 (×8): qty 2.5

## 2014-06-13 MED ORDER — CIPROFLOXACIN IN D5W 400 MG/200ML IV SOLN
400.0000 mg | Freq: Two times a day (BID) | INTRAVENOUS | Status: DC
  Filled 2014-06-13: qty 200

## 2014-06-13 MED ORDER — SODIUM CHLORIDE 0.9 % IV SOLN
1.0000 g | INTRAVENOUS | Status: DC
  Administered 2014-06-13 – 2014-06-17 (×5): 1 g via INTRAVENOUS
  Filled 2014-06-13 (×5): qty 1

## 2014-06-13 MED ORDER — SACCHAROMYCES BOULARDII 250 MG PO CAPS
250.0000 mg | ORAL_CAPSULE | Freq: Two times a day (BID) | ORAL | Status: DC
  Administered 2014-06-13 – 2014-06-17 (×9): 250 mg via ORAL
  Filled 2014-06-13 (×10): qty 1

## 2014-06-13 NOTE — Progress Notes (Signed)
[facility] PROGRESS NOTE  [name] KGU:[id number] DOB: 03-22-1966 DOA: 06/11/2014 PCP: Nathaniel Smolder, MD  Assessment/Plan: 1. Diverticulitis 1. General surgery following 2. Presenting CT with evidence of diverticulitis, albeit slightly improved from study from 05/31/14 3. Discussed case with General Surgery, recommendations to continue on Cipro and flagyl (see below) 4. Cont with clear liquid diet for now 2. C.diff colitis likely present on admission 1. Already on flagyl 2. Per above, continue on cipro for concurrent treatment of diverticulitis 3. Have added probiotic 4. Consult ID for abx recommendations in this case 3. Asthma 1. Stable 2. No wheezing on exam 4. Obesity 1. Seems stable 5. Hx fatty liver 1. Recent LFT's reviewed and are normal 6. DVT prophylaxis 1. Lovenox subQ  Code Status: Full Family Communication: Pt in room (indicate person spoken with, relationship, and if by phone, the number) Disposition Plan: Pending   Consultants:  General Surgery  ID  Procedures:   Antibiotics:  Ciprofloxacin 2/15>>> Flagyl 2/15>>>  HPI/Subjective: Pt is without complaints, in good spirits this AM. Continues with lower quadrant pains  Objective: Filed Vitals:   06/12/14 2159 06/13/14 0602 06/13/14 0831 06/13/14 1427  BP: 160/77 158/87  145/69  Pulse: 75 65  75  Temp: 98.2 F (36.8 C) 98.1 F (36.7 C)  98 F (36.7 C)  TempSrc: Oral Oral  Oral  Resp: 18 20  20   Height:      Weight:      SpO2: 98% 96% 97% 96%    Intake/Output Summary (Last 24 hours) at 06/13/14 1550 Last data filed at 06/13/14 1400  Gross per 24 hour  Intake 3711.67 ml  Output   1400 ml  Net 2311.67 ml   Filed Weights   06/11/14 0606 06/11/14 1230  Weight: 154.223 kg (340 lb) 149.687 kg (330 lb)    Exam:   General:  Awake, in nad  Cardiovascular: regular, s1, s2  Respiratory: normal resp effort, no wheezing  Abdomen: soft,obese, tenderness in lower  quadrants  Musculoskeletal: perfused,no clubbing   Data Reviewed: Basic Metabolic Panel:  Recent Labs Lab 06/11/14 0519 06/12/14 0515  NA 137 137  K 4.3 3.4*  CL 100 110  CO2 29 24  GLUCOSE 105* 99  BUN 12 10  CREATININE 1.30 1.17  CALCIUM 9.7 8.3*   Liver Function Tests:  Recent Labs Lab 06/11/14 0519  AST 20  ALT 25  ALKPHOS 71  BILITOT 0.6  PROT 7.9  ALBUMIN 4.3    Recent Labs Lab 06/11/14 0519  LIPASE 28   No results for input(s): AMMONIA in the last 168 hours. CBC:  Recent Labs Lab 06/11/14 0519 06/12/14 0515  WBC 12.8* 7.7  NEUTROABS 10.8*  --   HGB 15.7 13.1  HCT 48.0 41.8  MCV 88.1 89.3  PLT 322 260   Cardiac Enzymes: No results for input(s): CKTOTAL, CKMB, CKMBINDEX, TROPONINI in the last 168 hours. BNP (last 3 results) No results for input(s): BNP in the last 8760 hours.  ProBNP (last 3 results) No results for input(s): PROBNP in the last 8760 hours.  CBG:  Recent Labs Lab 06/11/14 2143  GLUCAP 82    Recent Results (from the past 240 hour(s))  Clostridium Difficile by PCR     Status: Abnormal   Collection Time: 06/12/14  9:50 PM  Result Value Ref Range Status   C difficile by pcr POSITIVE (A) NEGATIVE Final    Comment: CRITICAL RESULT CALLED TO, READ BACK BY AND VERIFIED WITH: D. Jacqualyn Posey  RN 11:00 06/13/14 ([email or web address]) Performed at [facility]      Studies: No results found.  Scheduled Meds: . ciprofloxacin  400 mg Intravenous Q12H  . enoxaparin (LOVENOX) injection  40 mg Subcutaneous Q24H  . Fluticasone-Salmeterol  1 puff Inhalation BID  . metronidazole  500 mg Intravenous Q8H  . saccharomyces boulardii  250 mg Oral BID   Continuous Infusions: . sodium chloride 125 mL/hr at 06/13/14 1046    Principal Problem:   Diverticulitis Active Problems:   Asthma   Obesity   Nathaniel Chambers, [facility] Pager [phone]. If 7PM-7AM, please contact night-coverage at [email or web address], password  [facility] 06/13/2014, 3:50 PM  LOS: 2 days

## 2014-06-13 NOTE — Progress Notes (Addendum)
Text paged Dr Wyline Copas of + C Diff results.  Await response.  Enteric precautions in place

## 2014-06-13 NOTE — Progress Notes (Addendum)
[facility] Progress Note     Subjective: Pt says he feels 40% better.  Tolerating clear liquids.  Having BM's and flatus.  WBC normalized.  Ambulating well through the halls.    Objective: Vital signs in last 24 hours: Temp:  [98.1 F (36.7 C)-98.2 F (36.8 C)] 98.1 F (36.7 C) (02/17 0602) Pulse Rate:  [65-75] 65 (02/17 0602) Resp:  [18-20] 20 (02/17 0602) BP: (145-160)/(77-87) 158/87 mmHg (02/17 0602) SpO2:  [94 %-98 %] 96 % (02/17 0602) Last BM Date: 06/13/14  Intake/Output from previous day: 02/16 0701 - 02/17 0700 In: 3611.7 [P.O.:570; I.V.:2741.7; IV Piggyback:300] Out: 2000 [Urine:2000] Intake/Output this shift:    PE: Gen:  Alert, NAD, pleasant Abd: Obese, soft, ND, tender in LLQ and lower abdomen, +BS, no HSM, abdominal scars noted   Lab Results:   Recent Labs  06/11/14 0519 06/12/14 0515  WBC 12.8* 7.7  HGB 15.7 13.1  HCT 48.0 41.8  PLT 322 260   BMET  Recent Labs  06/11/14 0519 06/12/14 0515  NA 137 137  K 4.3 3.4*  CL 100 110  CO2 29 24  GLUCOSE 105* 99  BUN 12 10  CREATININE 1.30 1.17  CALCIUM 9.7 8.3*   PT/INR No results for input(s): LABPROT, INR in the last 72 hours. CMP     Component Value Date/Time   NA 137 06/12/2014 0515   K 3.4* 06/12/2014 0515   CL 110 06/12/2014 0515   CO2 24 06/12/2014 0515   GLUCOSE 99 06/12/2014 0515   BUN 10 06/12/2014 0515   CREATININE 1.17 06/12/2014 0515   CALCIUM 8.3* 06/12/2014 0515   PROT 7.9 06/11/2014 0519   ALBUMIN 4.3 06/11/2014 0519   AST 20 06/11/2014 0519   ALT 25 06/11/2014 0519   ALKPHOS 71 06/11/2014 0519   BILITOT 0.6 06/11/2014 0519   GFRNONAA 72* 06/12/2014 0515   GFRAA 84* 06/12/2014 0515   Lipase     Component Value Date/Time   LIPASE 28 06/11/2014 0519       Studies/Results: Ct Abdomen Pelvis W Contrast  06/11/2014   CLINICAL DATA:  Diverticulitis diagnosed November 2015. Mid abdominal pain and nausea for 2 weeks.  EXAM: CT ABDOMEN AND PELVIS WITH  CONTRAST  TECHNIQUE: Multidetector CT imaging of the abdomen and pelvis was performed using the standard protocol following bolus administration of intravenous contrast.  CONTRAST:  197mL OMNIPAQUE IOHEXOL 300 MG/ML  SOLN  COMPARISON:  05/31/2014 the [facility]  FINDINGS: BODY WALL: Fatty bilateral inguinal hernias.  LOWER CHEST: Unremarkable.  ABDOMEN/PELVIS:  Liver: Tiny presumed cyst in the deep left liver.  Biliary: No evidence of biliary obstruction or stone.  Pancreas: Unremarkable.  Spleen: Unremarkable.  Adrenals: Unremarkable.  Kidneys and ureters: No hydronephrosis or stone.  Bladder: Unremarkable.  Reproductive: Unremarkable.  Bowel: Lap band which is in stable position. The stomach is distended, but there is no visible gastric outlet obstruction. No concerning wall thickening around the lap band.  Persistent inflammatory change and wall thickening of the mid sigmoid colon, the site of contained perforation on previous CT. The previously gas-filled cavity has collapsed and inflammation has mildly decreased.  Retroperitoneum: No mass or adenopathy.  Peritoneum: No ascites or pneumoperitoneum.  Vascular: No acute abnormality.  OSSEOUS: No acute abnormalities. L5-S1 degenerative disc narrowing and retrolisthesis.  IMPRESSION: Mild improvement of sigmoid diverticulitis since 05/31/2014. The previously noted contained perforation has resolved/collapsed.   Electronically Signed   By: Monte Fantasia M.D.   On: 06/11/2014 08:39  Anti-infectives: Anti-infectives    Start     Dose/Rate Route Frequency Ordered Stop   06/11/14 1200  ciprofloxacin (CIPRO) IVPB 400 mg     400 mg 200 mL/hr over 60 Minutes Intravenous Every 12 hours 06/11/14 1152     06/11/14 1200  metroNIDAZOLE (FLAGYL) IVPB 500 mg     500 mg 100 mL/hr over 60 Minutes Intravenous Every 8 hours 06/11/14 1152     06/11/14 1045  ciprofloxacin (CIPRO) IVPB 400 mg     400 mg 200 mL/hr over 60 Minutes Intravenous  Once 06/11/14 1040  06/11/14 1153   06/11/14 1045  metroNIDAZOLE (FLAGYL) IVPB 500 mg     500 mg 100 mL/hr over 60 Minutes Intravenous  Once 06/11/14 1040 06/11/14 1153       Assessment/Plan Smoldering recurrent diverticulitis - On Cipro/Flagyl - 2/15 >>> C. 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 Allow clears, would not advance past this until pain improved, IVF, pain control, antiemetics, antibiotics (Cipro/flagyl Day #3, seems to be responding well to this) 2. SCD's and VTE proph per medicine 3. Ambulate and IS 4. Pain is out of proportion to the CT findings. May not likely need urgent surgery while he's here, but he will need surgery to prevent this from re-occuring. 5. The goal would be to get him through this episode with close follow up and schedule to surgery in 6-10 weeks from now. Recent colonoscopy done by Dr. Michail Sermon in July 2015, but may need updated one.   LOS: 2 days   Coralie Keens 06/13/2014, 7:55 AM Pager: [phone]  Agree with above. Now C. Diff positive. This presents a more complex antibiotic picture and agree with asking ID assistance (Dr. Page Spiro to see). It also will change plans for diverticular disease treatment. Since he had abdominal symptoms in the face of an improving CT scan of his abdomen, I wonder if C Diff is not more the problem this admission.  Alphonsa Overall, MD, [facility] Pager: [phone] Office phone:  [phone]

## 2014-06-13 NOTE — Consult Note (Addendum)
[facility] Infectious Disease  Date of Admission:  06/11/2014  Date of Consult:  06/13/2014  Reason for Consult: C difficile colitis Referring Physician: Wyline Copas  Impression/Recommendation C difficile colitis Diverticulitis Obesity  Would Change him to invanz Stop flagyl, start oral vanco (136m qid for 7 days, 1243mbid for 7 days, 1256mday for 7 days, 125m74md for 7 days, 125mg4m for 14 days) Check HIV  Comment- Flagyl would be/is appropriate treatment for this pt. My concern is that he will be on prolonged anbx and will most likely get repeat courses of anbx. Trying to eradicate his infection, spore carriage, would be very helpful.    Thank you so much for this interesting consult,   JeffrBobby Rumpfer) [phone]   Nathaniel Chambers 49 y.61 male.  HPI: 49 yo37 with hx of obesity (BMI 43) with previous lap banding 2008, diverticulitis who was started on augmentin 05-31-14 for a recurrence. He developed n/v, and pain and diarrhea [facility] CT scan that showed: Mild improvement of sigmoid diverticulitis since 05/31/2014. The previously noted contained perforation has resolved/collapsed.  On admission he was started on cipro/flagyl. He continued to have watery BM in hospital. His WBC on admission was 9/1 rising to 12.8, then falling to 7.7 within 24h.  He is now found to have C diff PCR +.  Has not had BM today.   Past Medical History  Diagnosis Date  . Asthma   . Pneumonia     history of  . 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 Diverticulitis     Past Surgical History  Procedure Laterality Date  . Laparoscopic gastric banding  04-26-2007  . Repair recurrent right inguinal hernia  10-22-2008  . Surg. for undescended testicles/ bilateral inguinal hernia repair  AGE 28 MON OLD  . Left shoulder surg  2000    rotator cuff  . Hernia  repair  08/2008    rt inguinal  . Shoulder arthroscopy  01/2009    rt   . Shoulder arthroscopy  07/01/2011    Procedure: ARTHROSCOPY SHOULDER;  Surgeon: JamesMagnus Sinning  [facility];  Laterality: Left;  WITH LABRAL DEBRIDEMENT  . Shoulder open rotator cuff repair  07/01/2011    Procedure: ROTATOR CUFF REPAIR SHOULDER OPEN;  Surgeon: JamesMagnus Sinning  [facility];  Laterality: Left;  OPEN DISTAL CLAVICLE REPAIR  . Vasectomy    . Leg surgery      cancer     Allergies  Allergen Reactions  . Penicillins Shortness Of Breath    Swelling of uvula  . Solu-Medrol [Methylprednisolone Acetate] Anaphylaxis  . Dilaudid [Hydromorphone Hcl] Nausea And Vomiting  . Oxycodone Nausea And Vomiting  . Percocet [Oxycodone-Acetaminophen] Nausea And Vomiting    Medications:  Scheduled: . ciprofloxacin  400 mg Intravenous Q12H  . enoxaparin (LOVENOX) injection  40 mg Subcutaneous Q24H  . Fluticasone-Salmeterol  1 puff Inhalation BID  . metronidazole  500 mg Intravenous Q8H  . saccharomyces boulardii  250 mg Oral BID    Abtx:  Anti-infectives    Start     Dose/Rate Route Frequency Ordered Stop   06/13/14 2100  ciprofloxacin (CIPRO) IVPB 400 mg     400 mg 200 mL/hr over 60 Minutes Intravenous Every 12  hours 06/13/14 1150     06/11/14 1200  ciprofloxacin (CIPRO) IVPB 400 mg  Status:  Discontinued     400 mg 200 mL/hr over 60 Minutes Intravenous Every 12 hours 06/11/14 1152 06/13/14 1148   06/11/14 1200  metroNIDAZOLE (FLAGYL) IVPB 500 mg     500 mg 100 mL/hr over 60 Minutes Intravenous Every 8 hours 06/11/14 1152     06/11/14 1045  ciprofloxacin (CIPRO) IVPB 400 mg     400 mg 200 mL/hr over 60 Minutes Intravenous  Once 06/11/14 1040 06/11/14 1153   06/11/14 1045  metroNIDAZOLE (FLAGYL) IVPB 500 mg     500 mg 100 mL/hr over 60 Minutes Intravenous  Once 06/11/14 1040 06/11/14 1153      Total  days of antibiotics 3 (cipro/flagyl)          Social History:  reports that he quit smoking about 25 years ago. He has quit using smokeless tobacco. He reports that he does not drink alcohol or use illicit drugs.  Family History  Problem Relation Age of Onset  . Heart disease Father   . Cancer Mother     kidney, spleen    General ROS: +: fevers, loose BM. Pt hungry wants regular diet. see HPI.   Blood pressure 145/69, pulse 75, temperature 98 F (36.7 C), temperature source Oral, resp. rate 20, height 6' 2"  (1.88 m), weight 149.687 kg (330 lb), SpO2 96 %. General appearance: alert, cooperative and no distress Eyes: negative findings: conjunctivae and sclerae normal and pupils equal, round, reactive to light and accomodation Throat: lips, mucosa, and tongue normal; teeth and gums normal Neck: no adenopathy and supple, symmetrical, trachea midline Lungs: clear to auscultation bilaterally Heart: regular rate and rhythm Abdomen: normal findings: bowel sounds normal and soft. mild diffuse tenderness.  Extremities: edema none   Results for orders placed or performed during the hospital encounter of 06/11/14 (from the past 48 hour(s))  Glucose, capillary     Status: None   Collection Time: 06/11/14  9:43 PM  Result Value Ref Range   Glucose-Capillary 82 70 - 99 mg/dL  Basic metabolic panel     Status: Abnormal   Collection Time: 06/12/14  5:15 AM  Result Value Ref Range   Sodium 137 135 - 145 mmol/L   Potassium 3.4 (L) 3.5 - 5.1 mmol/L    Comment: DELTA CHECK NOTED REPEATED TO VERIFY    Chloride 110 96 - 112 mmol/L    Comment: DELTA CHECK NOTED REPEATED TO VERIFY    CO2 24 19 - 32 mmol/L   Glucose, Bld 99 70 - 99 mg/dL   BUN 10 6 - 23 mg/dL   Creatinine, Ser 1.17 0.50 - 1.35 mg/dL   Calcium 8.3 (L) 8.4 - 10.5 mg/dL   GFR calc non Af Amer 72 (L) >90 mL/min   GFR calc Af Amer 84 (L) >90 mL/min    Comment: (NOTE) The eGFR has been calculated using the CKD EPI equation. This  calculation has not been validated in all clinical situations. eGFR's persistently <90 mL/min signify possible Chronic Kidney Disease.    Anion gap 3 (L) 5 - 15  CBC     Status: None   Collection Time: 06/12/14  5:15 AM  Result Value Ref Range   WBC 7.7 4.0 - 10.5 K/uL   RBC 4.68 4.22 - 5.81 MIL/uL   Hemoglobin 13.1 13.0 - 17.0 g/dL   HCT 41.8 39.0 - 52.0 %   MCV 89.3 78.0 - 100.0  fL   MCH 28.0 26.0 - 34.0 pg   MCHC 31.3 30.0 - 36.0 g/dL   RDW 14.8 11.5 - 15.5 %   Platelets 260 150 - 400 K/uL  Clostridium Difficile by PCR     Status: Abnormal   Collection Time: 06/12/14  9:50 PM  Result Value Ref Range   C difficile by pcr POSITIVE (A) NEGATIVE    Comment: CRITICAL RESULT CALLED TO, READ BACK BY AND VERIFIED WITH: Zachery Dakins RN 11:00 06/13/14 ([email or web address]) Performed at [facility]    No results found for: SDES, SPECREQUEST, CULT, REPTSTATUS No results found. Recent Results (from the past 240 hour(s))  Clostridium Difficile by PCR     Status: Abnormal   Collection Time: 06/12/14  9:50 PM  Result Value Ref Range Status   C difficile by pcr POSITIVE (A) NEGATIVE Final    Comment: CRITICAL RESULT CALLED TO, READ BACK BY AND VERIFIED WITH: Zachery Dakins RN 11:00 06/13/14 ([email or web address]) Performed at [facility]       06/13/2014, 3:40 PM     LOS: 2 days

## 2014-06-14 DIAGNOSIS — K5733 Diverticulitis of large intestine without perforation or abscess with bleeding: Secondary | ICD-10-CM

## 2014-06-14 DIAGNOSIS — A0472 Enterocolitis due to Clostridium difficile, not specified as recurrent: Secondary | ICD-10-CM | POA: Insufficient documentation

## 2014-06-14 DIAGNOSIS — K5792 Diverticulitis of intestine, part unspecified, without perforation or abscess without bleeding: Secondary | ICD-10-CM

## 2014-06-14 LAB — BASIC METABOLIC PANEL
Anion gap: 8 (ref 5–15)
CALCIUM: 9 mg/dL (ref 8.4–10.5)
CO2: 29 mmol/L (ref 19–32)
CREATININE: 1.13 mg/dL (ref 0.50–1.35)
Chloride: 102 mmol/L (ref 96–112)
GFR calc Af Amer: 87 mL/min — ABNORMAL LOW (ref >90)
GFR calc non Af Amer: 75 mL/min — ABNORMAL LOW (ref >90)
GLUCOSE: 89 mg/dL (ref 70–99)
Potassium: 3.5 mmol/L (ref 3.5–5.1)
SODIUM: 139 mmol/L (ref 135–145)

## 2014-06-14 LAB — CBC
HCT: 44.3 % (ref 39.0–52.0)
HEMOGLOBIN: 14.5 g/dL (ref 13.0–17.0)
MCH: 28.5 pg (ref 26.0–34.0)
MCHC: 32.7 g/dL (ref 30.0–36.0)
MCV: 87.2 fL (ref 78.0–100.0)
Platelets: 281 10*3/uL (ref 150–400)
RBC: 5.08 MIL/uL (ref 4.22–5.81)
RDW: 13.9 % (ref 11.5–15.5)
WBC: 7.5 10*3/uL (ref 4.0–10.5)

## 2014-06-14 MED ORDER — MORPHINE SULFATE 2 MG/ML IJ SOLN
2.0000 mg | Freq: Once | INTRAMUSCULAR | Status: AC
  Administered 2014-06-14: 2 mg via INTRAVENOUS
  Filled 2014-06-14: qty 1

## 2014-06-14 NOTE — Progress Notes (Signed)
INFECTIOUS DISEASE PROGRESS NOTE  ID: Nathaniel Chambers is a 49 y.o. male with  Principal Problem:   Diverticulitis Active Problems:   Asthma   Obesity  Subjective: Still having abd pain.  No BM in 24h  Abtx:  Anti-infectives    Start     Dose/Rate Route Frequency Ordered Stop   06/13/14 2100  ciprofloxacin (CIPRO) IVPB 400 mg  Status:  Discontinued     400 mg 200 mL/hr over 60 Minutes Intravenous Every 12 hours 06/13/14 1150 06/13/14 1620   06/13/14 1800  ertapenem (INVANZ) 1 g in sodium chloride 0.9 % 50 mL IVPB     1 g 100 mL/hr over 30 Minutes Intravenous Every 24 hours 06/13/14 1620     06/13/14 1800  vancomycin (VANCOCIN) 50 mg/mL oral solution 125 mg     125 mg Oral 4 times per day 06/13/14 1620 06/15/14 1159   06/11/14 1200  ciprofloxacin (CIPRO) IVPB 400 mg  Status:  Discontinued     400 mg 200 mL/hr over 60 Minutes Intravenous Every 12 hours 06/11/14 1152 06/13/14 1148   06/11/14 1200  metroNIDAZOLE (FLAGYL) IVPB 500 mg  Status:  Discontinued     500 mg 100 mL/hr over 60 Minutes Intravenous Every 8 hours 06/11/14 1152 06/13/14 1620   06/11/14 1045  ciprofloxacin (CIPRO) IVPB 400 mg     400 mg 200 mL/hr over 60 Minutes Intravenous  Once 06/11/14 1040 06/11/14 1153   06/11/14 1045  metroNIDAZOLE (FLAGYL) IVPB 500 mg     500 mg 100 mL/hr over 60 Minutes Intravenous  Once 06/11/14 1040 06/11/14 1153      Medications:  Scheduled: . enoxaparin (LOVENOX) injection  40 mg Subcutaneous Q24H  . ertapenem  1 g Intravenous Q24H  . Fluticasone-Salmeterol  1 puff Inhalation BID  . saccharomyces boulardii  250 mg Oral BID  . vancomycin  125 mg Oral 4 times per day    Objective: Vital signs in last 24 hours: Temp:  [98.1 F (36.7 C)-98.3 F (36.8 C)] 98.3 F (36.8 C) (02/18 1421) Pulse Rate:  [65-78] 78 (02/18 1421) Resp:  [20] 20 (02/18 1421) BP: (128-146)/(77-89) 142/88 mmHg (02/18 1421) SpO2:  [94 %-97 %] 96 % (02/18 1421)   General appearance: alert,  cooperative and mild distress Resp: clear to auscultation bilaterally Cardio: regular rate and rhythm GI: normal findings: soft and abnormal findings:  hypoactive bowel sounds and mild diffuse tenderness  Lab Results  Recent Labs  06/12/14 0515 06/14/14 0500  WBC 7.7 7.5  HGB 13.1 14.5  HCT 41.8 44.3  NA 137 139  K 3.4* 3.5  CL 110 102  CO2 24 29  BUN 10 <5*  CREATININE 1.17 1.13   Liver Panel No results for input(s): PROT, ALBUMIN, AST, ALT, ALKPHOS, BILITOT, BILIDIR, IBILI in the last 72 hours. Sedimentation Rate No results for input(s): ESRSEDRATE in the last 72 hours. C-Reactive Protein No results for input(s): CRP in the last 72 hours.  Microbiology: Recent Results (from the past 240 hour(s))  Clostridium Difficile by PCR     Status: Abnormal   Collection Time: 06/12/14  9:50 PM  Result Value Ref Range Status   C difficile by pcr POSITIVE (A) NEGATIVE Final    Comment: CRITICAL RESULT CALLED TO, READ BACK BY AND VERIFIED WITH: Zachery Dakins RN 11:00 06/13/14 (wilsonm) Performed at [facility]     Studies/Results: No results found.   Assessment/Plan: C difficile colitis Diverticulitis Obesity Total days of antibiotics:  4 Invanz, po vanco  Would plan on 21 days total of anbx for diverticulitis (17 more days of Invanz) Plan on 41 more days of PO vanco Taper as written yesterday.  Home when pain managed PO.          Bobby Rumpf Infectious Diseases (pager) [phone] [email or web address] 06/14/2014, 4:20 PM  LOS: 3 days

## 2014-06-14 NOTE — Progress Notes (Signed)
[facility] PROGRESS NOTE  Nathaniel Chambers WFU:[id number] DOB: Nov 21, 1965 DOA: 06/11/2014 PCP: [name], MD  Assessment/Plan: 1. Diverticulitis 1. Improving 2. General surgery following 3. Presenting CT with evidence of diverticulitis, albeit slightly improved from study from 05/31/14 4. Now tolerating an advanced diet 5. Surgery recs to continue total of 42 days of PO vanc prior to considering surgery for diverticulitis 6. Currently also on Invanz per ID recs 2. C.diff colitis likely present on admission 1. Initially on flagyl 2. Appreciate ID input with transition to PO vanc 3. Asthma 1. Remains stable 2. No wheezing on exam 4. Obesity 1. Seems stable 2. Pt states he will be more aggressive at losing wt after discharge 5. Hx fatty liver 1. Recent LFT's reviewed and are normal 6. DVT prophylaxis 1. Lovenox subQ  Code Status: Full Family Communication: Pt in room Disposition Plan: Possible home on 2/19 if pt tolerates regular PO   Consultants:  General Surgery  ID  Procedures:   Antibiotics:  Ciprofloxacin 2/15>>>2/17  Flagyl 2/15>>> 2/17  Invanz 2/17>>>  PO Vanc 2/17>>>  HPI/Subjective: Reports feeling much better today.  Objective: Filed Vitals:   06/13/14 2107 06/14/14 0522 06/14/14 1055 06/14/14 1421  BP: 128/77 146/89  142/88  Pulse: 68 65  78  Temp: 98.1 F (36.7 C) 98.3 F (36.8 C)  98.3 F (36.8 C)  TempSrc: Oral Oral  Oral  Resp: 20 20  20   Height:      Weight:      SpO2: 97% 96% 96% 96%    Intake/Output Summary (Last 24 hours) at 06/14/14 1555 Last data filed at 06/14/14 1423  Gross per 24 hour  Intake 3141.25 ml  Output   1750 ml  Net 1391.25 ml   Filed Weights   06/11/14 0606 06/11/14 1230  Weight: 154.223 kg (340 lb) 149.687 kg (330 lb)    Exam:   General:  Awake, laying in bed, in nad  Cardiovascular: regular, s1, s2  Respiratory: normal resp effort, no wheezing  Abdomen: soft,obese, tenderness in lower  quadrants  Musculoskeletal: perfused,no clubbing   Data Reviewed: Basic Metabolic Panel:  Recent Labs Lab 06/11/14 0519 06/12/14 0515 06/14/14 0500  NA 137 137 139  K 4.3 3.4* 3.5  CL 100 110 102  CO2 29 24 29   GLUCOSE 105* 99 89  BUN 12 10 <5*  CREATININE 1.30 1.17 1.13  CALCIUM 9.7 8.3* 9.0   Liver Function Tests:  Recent Labs Lab 06/11/14 0519  AST 20  ALT 25  ALKPHOS 71  BILITOT 0.6  PROT 7.9  ALBUMIN 4.3    Recent Labs Lab 06/11/14 0519  LIPASE 28   No results for input(s): AMMONIA in the last 168 hours. CBC:  Recent Labs Lab 06/11/14 0519 06/12/14 0515 06/14/14 0500  WBC 12.8* 7.7 7.5  NEUTROABS 10.8*  --   --   HGB 15.7 13.1 14.5  HCT 48.0 41.8 44.3  MCV 88.1 89.3 87.2  PLT 322 260 281   Cardiac Enzymes: No results for input(s): CKTOTAL, CKMB, CKMBINDEX, TROPONINI in the last 168 hours. BNP (last 3 results) No results for input(s): BNP in the last 8760 hours.  ProBNP (last 3 results) No results for input(s): PROBNP in the last 8760 hours.  CBG:  Recent Labs Lab 06/11/14 2143  GLUCAP 82    Recent Results (from the past 240 hour(s))  Clostridium Difficile by PCR     Status: Abnormal   Collection Time: 06/12/14  9:50 PM  Result Value  Ref Range Status   C difficile by pcr POSITIVE (A) NEGATIVE Final    Comment: CRITICAL RESULT CALLED TO, READ BACK BY AND VERIFIED WITH: Zachery Dakins RN 11:00 06/13/14 ([email or web address]) Performed at [facility]      Studies: No results found.  Scheduled Meds: . enoxaparin (LOVENOX) injection  40 mg Subcutaneous Q24H  . ertapenem  1 g Intravenous Q24H  . Fluticasone-Salmeterol  1 puff Inhalation BID  . saccharomyces boulardii  250 mg Oral BID  . vancomycin  125 mg Oral 4 times per day   Continuous Infusions: . sodium chloride 50 mL/hr at 06/14/14 0753    Principal Problem:   Diverticulitis Active Problems:   Asthma   Obesity   Antony Sian, [facility] Pager [phone].  If 7PM-7AM, please contact night-coverage at [email or web address], password [facility] 06/14/2014, 3:55 PM  LOS: 3 days

## 2014-06-14 NOTE — Progress Notes (Addendum)
[facility] Progress Note     Subjective: Pt doing much better.  Not much abdominal pain, no N/V.  Ambulating well, having BM's.  Started on new C.diff regimen.  No complaints.  Objective: Vital signs in last 24 hours: Temp:  [98 F (36.7 C)-98.3 F (36.8 C)] 98.3 F (36.8 C) (02/18 0522) Pulse Rate:  [65-75] 65 (02/18 0522) Resp:  [20] 20 (02/18 0522) BP: (128-146)/(69-89) 146/89 mmHg (02/18 0522) SpO2:  [94 %-97 %] 96 % (02/18 0522) Last BM Date: 06/13/14  Intake/Output from previous day: 02/17 0701 - 02/18 0700 In: 4570 [P.O.:720; I.V.:3000; IV Piggyback:850] Out: 1450 [Urine:1450] Intake/Output this shift:    PE: Gen:  Alert, NAD, pleasant Abd: Obese, soft, NT/ND, +BS, no HSM   Lab Results:   Recent Labs  06/12/14 0515 06/14/14 0500  WBC 7.7 7.5  HGB 13.1 14.5  HCT 41.8 44.3  PLT 260 281   BMET  Recent Labs  06/12/14 0515 06/14/14 0500  NA 137 139  K 3.4* 3.5  CL 110 102  CO2 24 29  GLUCOSE 99 89  BUN 10 <5*  CREATININE 1.17 1.13  CALCIUM 8.3* 9.0   PT/INR No results for input(s): LABPROT, INR in the last 72 hours. CMP     Component Value Date/Time   NA 139 06/14/2014 0500   K 3.5 06/14/2014 0500   CL 102 06/14/2014 0500   CO2 29 06/14/2014 0500   GLUCOSE 89 06/14/2014 0500   BUN <5* 06/14/2014 0500   CREATININE 1.13 06/14/2014 0500   CALCIUM 9.0 06/14/2014 0500   PROT 7.9 06/11/2014 0519   ALBUMIN 4.3 06/11/2014 0519   AST 20 06/11/2014 0519   ALT 25 06/11/2014 0519   ALKPHOS 71 06/11/2014 0519   BILITOT 0.6 06/11/2014 0519   GFRNONAA 75* 06/14/2014 0500   GFRAA 87* 06/14/2014 0500   Lipase     Component Value Date/Time   LIPASE 28 06/11/2014 0519       Studies/Results: No results found.  Anti-infectives: Anti-infectives    Start     Dose/Rate Route Frequency Ordered Stop   06/13/14 2100  ciprofloxacin (CIPRO) IVPB 400 mg  Status:  Discontinued     400 mg 200 mL/hr over 60 Minutes Intravenous Every 12  hours 06/13/14 1150 06/13/14 1620   06/13/14 1800  ertapenem (INVANZ) 1 g in sodium chloride 0.9 % 50 mL IVPB     1 g 100 mL/hr over 30 Minutes Intravenous Every 24 hours 06/13/14 1620     06/13/14 1800  vancomycin (VANCOCIN) 50 mg/mL oral solution 125 mg     125 mg Oral 4 times per day 06/13/14 1620 06/15/14 1159   06/11/14 1200  ciprofloxacin (CIPRO) IVPB 400 mg  Status:  Discontinued     400 mg 200 mL/hr over 60 Minutes Intravenous Every 12 hours 06/11/14 1152 06/13/14 1148   06/11/14 1200  metroNIDAZOLE (FLAGYL) IVPB 500 mg  Status:  Discontinued     500 mg 100 mL/hr over 60 Minutes Intravenous Every 8 hours 06/11/14 1152 06/13/14 1620   06/11/14 1045  ciprofloxacin (CIPRO) IVPB 400 mg     400 mg 200 mL/hr over 60 Minutes Intravenous  Once 06/11/14 1040 06/11/14 1153   06/11/14 1045  metroNIDAZOLE (FLAGYL) IVPB 500 mg     500 mg 100 mL/hr over 60 Minutes Intravenous  Once 06/11/14 1040 06/11/14 1153       Assessment/Plan Smoldering recurrent diverticulitis - On Cipro/Flagyl - 2/15 >>> 06/13/14 now on Invanz &  Vancomycin 06/13/14 >>> C. 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 Allow fulls today since pain improved, soft at lunch, IVF, pain control, antiemetics, antibiotics (now on Invanz & Vancomycin 06/13/14 >>>) 2. SCD's and VTE proph per medicine 3. Ambulate and IS 4. Given mixed picture at this point with now c.diff and likely improving diverticulitis, surgery will need to be delayed until the c.diff infection is resolved.  He will be treated for 42 days with Vancomycin for c.diff.   5.  Will arrange for follow up with Dr. Hassell Done in 1 month to discuss if surgical interventions are needed at that time for his diverticulitis 6.  He had a colonoscopy last summer - he's seen Dr. Michail Sermon in the past 7.  Home today  or tomorrow when tolerating solid food and pain controlled with orals   LOS: 3 days   Coralie Keens 06/14/2014, 7:36 AM Pager: [phone]  Agree with above. It appears that much of his symptoms this admission were from the C. Diff.  Alphonsa Overall, MD, [facility] Pager: [phone] Office phone:  [phone]

## 2014-06-14 NOTE — Progress Notes (Signed)
Pt c/o more pain after administration of PO pain medications. Dr. Wyline Copas paged to evaluate other options for pain control.

## 2014-06-15 ENCOUNTER — Inpatient Hospital Stay (HOSPITAL_COMMUNITY): Payer: BLUE CROSS/BLUE SHIELD

## 2014-06-15 DIAGNOSIS — A047 Enterocolitis due to Clostridium difficile: Secondary | ICD-10-CM

## 2014-06-15 DIAGNOSIS — K5732 Diverticulitis of large intestine without perforation or abscess without bleeding: Principal | ICD-10-CM | POA: Insufficient documentation

## 2014-06-15 LAB — CBC
HCT: 45.1 % (ref 39.0–52.0)
Hemoglobin: 14.6 g/dL (ref 13.0–17.0)
MCH: 28.2 pg (ref 26.0–34.0)
MCHC: 32.4 g/dL (ref 30.0–36.0)
MCV: 87.2 fL (ref 78.0–100.0)
Platelets: 316 10*3/uL (ref 150–400)
RBC: 5.17 MIL/uL (ref 4.22–5.81)
RDW: 13.9 % (ref 11.5–15.5)
WBC: 8 10*3/uL (ref 4.0–10.5)

## 2014-06-15 LAB — BASIC METABOLIC PANEL
ANION GAP: 7 (ref 5–15)
BUN: 7 mg/dL (ref 6–23)
CO2: 26 mmol/L (ref 19–32)
Calcium: 8.8 mg/dL (ref 8.4–10.5)
Chloride: 106 mmol/L (ref 96–112)
Creatinine, Ser: 1.06 mg/dL (ref 0.50–1.35)
GFR calc Af Amer: >90 mL/min (ref >90)
GFR, EST NON AFRICAN AMERICAN: 81 mL/min — AB (ref >90)
Glucose, Bld: 103 mg/dL — ABNORMAL HIGH (ref 70–99)
Potassium: 3.6 mmol/L (ref 3.5–5.1)
SODIUM: 139 mmol/L (ref 135–145)

## 2014-06-15 LAB — HIV ANTIBODY (ROUTINE TESTING W REFLEX): HIV Screen 4th Generation wRfx: NONREACTIVE

## 2014-06-15 MED ORDER — VANCOMYCIN 50 MG/ML ORAL SOLUTION: 125.0000 mg | Freq: Two times a day (BID) | ORAL | Status: DC

## 2014-06-15 MED ORDER — KETOROLAC TROMETHAMINE 30 MG/ML IJ SOLN
30.0000 mg | Freq: Once | INTRAMUSCULAR | Status: AC
  Administered 2014-06-15: 30 mg via INTRAVENOUS
  Filled 2014-06-15: qty 1

## 2014-06-15 MED ORDER — VANCOMYCIN 50 MG/ML ORAL SOLUTION: 125.0000 mg | Freq: Four times a day (QID) | ORAL | Status: DC

## 2014-06-15 MED ORDER — SODIUM CHLORIDE 0.9 % IV SOLN: 1.0000 g | INTRAVENOUS | Status: DC

## 2014-06-15 MED ORDER — VANCOMYCIN 50 MG/ML ORAL SOLUTION
125.0000 mg | Freq: Four times a day (QID) | ORAL | Status: DC
  Administered 2014-06-15 – 2014-06-17 (×8): 125 mg via ORAL
  Filled 2014-06-15 (×11): qty 2.5

## 2014-06-15 MED ORDER — VANCOMYCIN 50 MG/ML ORAL SOLUTION: 125.0000 mg | ORAL | Status: DC

## 2014-06-15 MED ORDER — GI COCKTAIL ~~LOC~~
30.0000 mL | Freq: Two times a day (BID) | ORAL | Status: DC | PRN
  Administered 2014-06-15 – 2014-06-16 (×2): 30 mL via ORAL
  Filled 2014-06-15 (×3): qty 30

## 2014-06-15 MED ORDER — SODIUM CHLORIDE 0.9 % IJ SOLN
10.0000 mL | INTRAMUSCULAR | Status: DC | PRN
  Administered 2014-06-16 – 2014-06-17 (×3): 10 mL
  Filled 2014-06-15 (×3): qty 40

## 2014-06-15 MED ORDER — VANCOMYCIN 50 MG/ML ORAL SOLUTION: 125.0000 mg | Freq: Every day | ORAL | Status: DC

## 2014-06-15 NOTE — Progress Notes (Signed)
INFECTIOUS DISEASE PROGRESS NOTE  ID: Nathaniel Chambers is a 49 y.o. male with  Principal Problem:   Diverticulitis Active Problems:   Asthma   Obesity   Enteritis due to Clostridium difficile  Subjective: Getting PIC.  1 BM last 24h No complaints  Abtx:  Anti-infectives    Start     Dose/Rate Route Frequency Ordered Stop   06/13/14 2100  ciprofloxacin (CIPRO) IVPB 400 mg  Status:  Discontinued     400 mg 200 mL/hr over 60 Minutes Intravenous Every 12 hours 06/13/14 1150 06/13/14 1620   06/13/14 1800  ertapenem (INVANZ) 1 g in sodium chloride 0.9 % 50 mL IVPB     1 g 100 mL/hr over 30 Minutes Intravenous Every 24 hours 06/13/14 1620     06/13/14 1800  vancomycin (VANCOCIN) 50 mg/mL oral solution 125 mg     125 mg Oral 4 times per day 06/13/14 1620 06/15/14 0641   06/11/14 1200  ciprofloxacin (CIPRO) IVPB 400 mg  Status:  Discontinued     400 mg 200 mL/hr over 60 Minutes Intravenous Every 12 hours 06/11/14 1152 06/13/14 1148   06/11/14 1200  metroNIDAZOLE (FLAGYL) IVPB 500 mg  Status:  Discontinued     500 mg 100 mL/hr over 60 Minutes Intravenous Every 8 hours 06/11/14 1152 06/13/14 1620   06/11/14 1045  ciprofloxacin (CIPRO) IVPB 400 mg     400 mg 200 mL/hr over 60 Minutes Intravenous  Once 06/11/14 1040 06/11/14 1153   06/11/14 1045  metroNIDAZOLE (FLAGYL) IVPB 500 mg     500 mg 100 mL/hr over 60 Minutes Intravenous  Once 06/11/14 1040 06/11/14 1153      Medications:  Scheduled: . enoxaparin (LOVENOX) injection  40 mg Subcutaneous Q24H  . ertapenem  1 g Intravenous Q24H  . Fluticasone-Salmeterol  1 puff Inhalation BID  . saccharomyces boulardii  250 mg Oral BID    Objective: Vital signs in last 24 hours: Temp:  [97.9 F (36.6 C)-98.7 F (37.1 C)] 98.7 F (37.1 C) (02/19 1400) Pulse Rate:  [66-78] 72 (02/19 1400) Resp:  [18-20] 20 (02/19 1400) BP: (135-148)/(74-92) 148/92 mmHg (02/19 1400) SpO2:  [96 %-98 %] 96 % (02/19 1400)   no exam, pt draped for PIC.     Lab Results  Recent Labs  06/14/14 0500 06/15/14 0530  WBC 7.5 8.0  HGB 14.5 14.6  HCT 44.3 45.1  NA 139 139  K 3.5 3.6  CL 102 106  CO2 29 26  BUN <5* 7  CREATININE 1.13 1.06   Liver Panel No results for input(s): PROT, ALBUMIN, AST, ALT, ALKPHOS, BILITOT, BILIDIR, IBILI in the last 72 hours. Sedimentation Rate No results for input(s): ESRSEDRATE in the last 72 hours. C-Reactive Protein No results for input(s): CRP in the last 72 hours.  Microbiology: Recent Results (from the past 240 hour(s))  Clostridium Difficile by PCR     Status: Abnormal   Collection Time: 06/12/14  9:50 PM  Result Value Ref Range Status   C difficile by pcr POSITIVE (A) NEGATIVE Final    Comment: CRITICAL RESULT CALLED TO, READ BACK BY AND VERIFIED WITH: Zachery Dakins RN 11:00 06/13/14 ([email or web address]) Performed at [facility]     Studies/Results: No results found.   Assessment/Plan: C difficile colitis Diverticulitis Obesity Total days of antibiotics: 5 Invanz, po vanco  Would plan on 21 days total of anbx for diverticulitis (16 more days of Invanz) Plan on 40 more days of PO vanco  PO vanco taper: (125mg  qid for 7 days, 125mg  bid for 7 days, 125mg  qday for 7 days, 125mg  qod for 7 days, 125mg  tiw for 14 days) Available if questions         Bobby Rumpf Infectious Diseases (pager) [phone] [email or web address] 06/15/2014, 3:10 PM  LOS: 4 days

## 2014-06-15 NOTE — Discharge Instructions (Signed)
Low-Fiber Diet °Fiber is found in fruits, vegetables, and whole grains. A low-fiber diet restricts fibrous foods that are not digested in the small intestine. A diet containing about 10-15 grams of fiber per day is considered low fiber. Low-fiber diets may be used to: °· Promote healing and rest the bowel during intestinal flare-ups. °· Prevent blockage of a partially obstructed or narrowed gastrointestinal tract. °· Reduce fecal weight and volume. °· Slow the movement of feces. °You may be on a low-fiber diet as a transitional diet following surgery, after an injury (trauma), or because of a short (acute) or lifelong (chronic) illness. Your health care provider will determine the length of time you need to stay on this diet.  °WHAT DO I NEED TO KNOW ABOUT A LOW-FIBER DIET? °Always check the fiber content on the packaging's Nutrition Facts label, especially on foods from the grains list. Ask your dietitian if you have questions about specific foods that are related to your condition, especially if the food is not listed below. In general, a low-fiber food will have less than 2 g of fiber. °WHAT FOODS CAN I EAT? °Grains °All breads and crackers made with white flour. Sweet rolls, doughnuts, waffles, pancakes, French toast, bagels. Pretzels, Melba toast, zwieback. Well-cooked cereals, such as cornmeal, farina, or cream cereals. Dry cereals that do not contain whole grains, fruit, or nuts, such as refined corn, wheat, rice, and oat cereals. Potatoes prepared any way without skins, plain pastas and noodles, refined white rice. Use white flour for baking and making sauces. Use allowed list of grains for casseroles, dumplings, and puddings.  °Vegetables °Strained tomato and vegetable juices. Fresh lettuce, cucumber, spinach. Well-cooked (no skin or pulp) or canned vegetables, such as asparagus, bean sprouts, beets, carrots, green beans, mushrooms, potatoes, pumpkin, spinach, yellow squash, tomato sauce/puree, turnips,  yams, and zucchini. Keep servings limited to ½ cup.  °Fruits °All fruit juices except prune juice. Cooked or canned fruits without skin and seeds, such as applesauce, apricots, cherries, fruit cocktail, grapefruit, grapes, mandarin oranges, melons, peaches, pears, pineapple, and plums. Fresh fruits without skin, such as apricots, avocados, bananas, melons, pineapple, nectarines, and peaches. Keep servings limited to ½ cup or 1 piece.   °Meat and Other Protein Sources °Ground or well-cooked tender beef, ham, veal, lamb, pork, or poultry. Eggs, plain cheese. Fish, oysters, shrimp, lobster, and other seafood. Liver, organ meats. Smooth nut butters. °Dairy °All milk products and alternative dairy substitutes, such as soy, rice, almond, and coconut, not containing added whole nuts, seeds, or added fruit. °Beverages °Decaf coffee, fruit, and vegetable juices or smoothies (small amounts, with no pulp or skins, and with fruits from allowed list), sports drinks, herbal tea. °Condiments °Ketchup, mustard, vinegar, cream sauce, cheese sauce, cocoa powder. Spices in moderation, such as allspice, basil, bay leaves, celery powder or leaves, cinnamon, cumin powder, curry powder, ginger, mace, marjoram, onion or garlic powder, oregano, paprika, parsley flakes, ground pepper, rosemary, sage, savory, tarragon, thyme, and turmeric. °Sweets and Desserts °Plain cakes and cookies, pie made with allowed fruit, pudding, custard, cream pie. Gelatin, fruit, ice, sherbet, frozen ice pops. Ice cream, ice milk without nuts. Plain hard candy, honey, jelly, molasses, syrup, sugar, chocolate syrup, gumdrops, marshmallows. Limit overall sugar intake.  °Fats and Oil °Margarine, butter, cream, mayonnaise, salad oils, plain salad dressings made from allowed foods. Choose healthy fats such as olive oil, canola oil, and omega-3 fatty acids (such as found in salmon or tuna) when possible.  °Other °Bouillon, broth, or cream soups made   from allowed foods.  Any strained soup. Casseroles or mixed dishes made with allowed foods. °The items listed above may not be a complete list of recommended foods or beverages. Contact your dietitian for more options.  °WHAT FOODS ARE NOT RECOMMENDED? °Grains °All whole wheat and whole grain breads and crackers. Multigrains, rye, bran seeds, nuts, or coconut. Cereals containing whole grains, multigrains, bran, coconut, nuts, raisins. Cooked or dry oatmeal, steel-cut oats. Coarse wheat cereals, granola. Cereals advertised as high fiber. Potato skins. Whole grain pasta, wild or brown rice. Popcorn. Coconut flour. Bran, buckwheat, corn bread, multigrains, rye, wheat germ.  °Vegetables °Fresh, cooked or canned vegetables, such as artichokes, asparagus, beet greens, broccoli, Brussels sprouts, cabbage, celery, cauliflower, corn, eggplant, kale, legumes or beans, okra, peas, and tomatoes. Avoid large servings of any vegetables, especially raw vegetables.  °Fruits °Fresh fruits, such as apples with or without skin, berries, cherries, figs, grapes, grapefruit, guavas, kiwis, mangoes, oranges, papayas, pears, persimmons, pineapple, and pomegranate. Prune juice and juices with pulp, stewed or dried prunes. Dried fruits, dates, raisins. Fruit seeds or skins. Avoid large servings of all fresh fruits. °Meats and Other Protein Sources °Tough, fibrous meats with gristle. Chunky nut butter. Cheese made with seeds, nuts, or other foods not recommended. Nuts, seeds, legumes (beans, including baked beans), dried peas, beans, lentils.  °Dairy °Yogurt or cheese that contains nuts, seeds, or added fruit.  °Beverages °Fruit juices with high pulp, prune juice. Caffeinated coffee and teas.  °Condiments °Coconut, maple syrup, pickles, olives. °Sweets and Desserts °Desserts, cookies, or candies that contain nuts or coconut, chunky peanut butter, dried fruits. Jams, preserves with seeds, marmalade. Large amounts of sugar and sweets. Any other dessert made with  fruits from the not recommended list.  °Other °Soups made from vegetables that are not recommended or that contain other foods not recommended.  °The items listed above may not be a complete list of foods and beverages to avoid. Contact your dietitian for more information. °Document Released: 10/03/2001 Document Revised: 04/18/2013 Document Reviewed: 03/06/2013 °Exit[employer]® Patient Information ©2015 [employer]. This information is not intended to replace advice given to you by your health care provider. Make sure you discuss any questions you have with your health care provider. ° °

## 2014-06-15 NOTE — Progress Notes (Signed)
[facility] PROGRESS NOTE  THANH MOTTERN DXI:[id number] DOB: 1965/09/09 DOA: 06/11/2014 PCP: [name], MD  Assessment/Plan: 1. Diverticulitis 1. More epigastric pain this AM 2. General surgery following 3. Presenting CT with evidence of diverticulitis, albeit slightly improved from study from 05/31/14 4. Now tolerating an advanced diet 5. Surgery recs to continue total of 42 days of PO vanc prior to considering surgery for diverticulitis 6. ID recs for 16 additional days of Invaz (stop date 3/6) 7. PICC has been placed 8. Consider trial of GI cocktail for epigastric pain 2. C.diff colitis likely present on admission 1. Initially on flagyl 2. Appreciate ID input with transition to PO vanc per above 3. Asthma 1. Remains stable 2. No wheezing on exam 4. Obesity 1. Seems stable 2. Pt states he will be more aggressive at losing wt after discharge 5. Hx fatty liver 1. Recent LFT's reviewed and are normal 6. DVT prophylaxis 1. Lovenox subQ  Code Status: Full Family Communication: Pt in room Disposition Plan: Possible home on 2/19 if pt tolerates regular PO   Consultants:  General Surgery  ID  Procedures:   Antibiotics:  Ciprofloxacin 2/15>>>2/17  Flagyl 2/15>>> 2/17  Invanz 2/17>>>  PO Vanc 2/17>>>  HPI/Subjective: Complains of epigastric pain. Some improvement with analgesics  Objective: Filed Vitals:   06/14/14 2104 06/15/14 0618 06/15/14 1012 06/15/14 1400  BP: 135/77 140/74 139/83 148/92  Pulse: 71 78 66 72  Temp: 98.3 F (36.8 C) 98.5 F (36.9 C) 97.9 F (36.6 C) 98.7 F (37.1 C)  TempSrc: Oral Oral Oral Oral  Resp: 20 20 18 20   Height:      Weight:      SpO2: 96% 98% 96% 96%    Intake/Output Summary (Last 24 hours) at 06/15/14 1647 Last data filed at 06/15/14 1000  Gross per 24 hour  Intake 1750.83 ml  Output   1455 ml  Net 295.83 ml   Filed Weights   06/11/14 0606 06/11/14 1230  Weight: 154.223 kg (340 lb) 149.687 kg (330 lb)     Exam:   General:  Awake, laying in bed, in nad  Cardiovascular: regular, s1, s2  Respiratory: normal resp effort, no wheezing  Abdomen: soft,obese, epigastric tenderness  Musculoskeletal: perfused,no clubbing   Data Reviewed: Basic Metabolic Panel:  Recent Labs Lab 06/11/14 0519 06/12/14 0515 06/14/14 0500 06/15/14 0530  NA 137 137 139 139  K 4.3 3.4* 3.5 3.6  CL 100 110 102 106  CO2 29 24 29 26   GLUCOSE 105* 99 89 103*  BUN 12 10 <5* 7  CREATININE 1.30 1.17 1.13 1.06  CALCIUM 9.7 8.3* 9.0 8.8   Liver Function Tests:  Recent Labs Lab 06/11/14 0519  AST 20  ALT 25  ALKPHOS 71  BILITOT 0.6  PROT 7.9  ALBUMIN 4.3    Recent Labs Lab 06/11/14 0519  LIPASE 28   No results for input(s): AMMONIA in the last 168 hours. CBC:  Recent Labs Lab 06/11/14 0519 06/12/14 0515 06/14/14 0500 06/15/14 0530  WBC 12.8* 7.7 7.5 8.0  NEUTROABS 10.8*  --   --   --   HGB 15.7 13.1 14.5 14.6  HCT 48.0 41.8 44.3 45.1  MCV 88.1 89.3 87.2 87.2  PLT 322 260 281 316   Cardiac Enzymes: No results for input(s): CKTOTAL, CKMB, CKMBINDEX, TROPONINI in the last 168 hours. BNP (last 3 results) No results for input(s): BNP in the last 8760 hours.  ProBNP (last 3 results) No results for input(s): PROBNP  in the last 8760 hours.  CBG:  Recent Labs Lab 06/11/14 2143  GLUCAP 82    Recent Results (from the past 240 hour(s))  Clostridium Difficile by PCR     Status: Abnormal   Collection Time: 06/12/14  9:50 PM  Result Value Ref Range Status   C difficile by pcr POSITIVE (A) NEGATIVE Final    Comment: CRITICAL RESULT CALLED TO, READ BACK BY AND VERIFIED WITH: Zachery Dakins RN 11:00 06/13/14 ([email or web address]) Performed at [facility]      Studies: Dg Abd Portable 1v  06/15/2014   CLINICAL DATA:  History of ileus.  EXAM: PORTABLE ABDOMEN - 1 VIEW  COMPARISON:  Abdominal CT 06/11/2014  FINDINGS: The bowel gas pattern is normal. Previously administered oral  contrast is still distributed throughout the colon. There is a lap band, which has a normal and unchanged oblique orientation. Status post left groin surgery. No concerning intra-abdominal calcification or mass effect.  IMPRESSION: 1. Normal bowel gas pattern. 2. Normal lap band orientation.   Electronically Signed   By: Monte Fantasia M.D.   On: 06/15/2014 15:17    Scheduled Meds: . enoxaparin (LOVENOX) injection  40 mg Subcutaneous Q24H  . ertapenem  1 g Intravenous Q24H  . Fluticasone-Salmeterol  1 puff Inhalation BID  . saccharomyces boulardii  250 mg Oral BID  . vancomycin  125 mg Oral 4 times per day   Followed by  . [START ON 06/20/2014] vancomycin  125 mg Oral Q12H   Followed by  . [START ON 06/28/2014] vancomycin  125 mg Oral Daily   Followed by  . [START ON 07/06/2014] vancomycin  125 mg Oral QODAY   Followed by  . [START ON 07/14/2014] vancomycin  125 mg Oral Once per day on Mon Wed Fri   Continuous Infusions: . sodium chloride 50 mL/hr at 06/15/14 0000    Principal Problem:   Diverticulitis Active Problems:   Asthma   Obesity   Enteritis due to Clostridium difficile   CHIU, STEPHEN K  [facility] Pager [phone]. If 7PM-7AM, please contact night-coverage at [email or web address], password [facility] 06/15/2014, 4:47 PM  LOS: 4 days

## 2014-06-15 NOTE — Progress Notes (Addendum)
[facility] Progress Note     Subjective: Pt doing much better, tolerated most of his lunch and dinner, does have some cramping.  Had a BM yesterday, having good flatus, pain well controlled with orals.  Not ambulated OOB much.  Objective: Vital signs in last 24 hours: Temp:  [98.3 F (36.8 C)-98.5 F (36.9 C)] 98.5 F (36.9 C) (02/19 0618) Pulse Rate:  [71-78] 78 (02/19 0618) Resp:  [20] 20 (02/19 0618) BP: (135-142)/(74-88) 140/74 mmHg (02/19 0618) SpO2:  [96 %-98 %] 98 % (02/19 0618) Last BM Date: 06/14/14  Intake/Output from previous day: 02/18 0701 - 02/19 0700 In: 2062.1 [P.O.:840; I.V.:1172.1; IV Piggyback:50] Out: 1855 [Urine:1855] Intake/Output this shift:    PE: Gen:  Alert, NAD, pleasant Abd: Soft, NT/ND, +BS, no HSM, incisions C/D/I, drain with minimal sanguinous drainage, no abdominal scars noted   Lab Results:   Recent Labs  06/14/14 0500 06/15/14 0530  WBC 7.5 8.0  HGB 14.5 14.6  HCT 44.3 45.1  PLT 281 316   BMET  Recent Labs  06/14/14 0500 06/15/14 0530  NA 139 139  K 3.5 3.6  CL 102 106  CO2 29 26  GLUCOSE 89 103*  BUN <5* 7  CREATININE 1.13 1.06  CALCIUM 9.0 8.8   PT/INR No results for input(s): LABPROT, INR in the last 72 hours. CMP     Component Value Date/Time   NA 139 06/15/2014 0530   K 3.6 06/15/2014 0530   CL 106 06/15/2014 0530   CO2 26 06/15/2014 0530   GLUCOSE 103* 06/15/2014 0530   BUN 7 06/15/2014 0530   CREATININE 1.06 06/15/2014 0530   CALCIUM 8.8 06/15/2014 0530   PROT 7.9 06/11/2014 0519   ALBUMIN 4.3 06/11/2014 0519   AST 20 06/11/2014 0519   ALT 25 06/11/2014 0519   ALKPHOS 71 06/11/2014 0519   BILITOT 0.6 06/11/2014 0519   GFRNONAA 81* 06/15/2014 0530   GFRAA >90 06/15/2014 0530   Lipase     Component Value Date/Time   LIPASE 28 06/11/2014 0519       Studies/Results: No results found.  Anti-infectives: Anti-infectives    Start     Dose/Rate Route Frequency Ordered Stop   06/13/14 2100  ciprofloxacin (CIPRO) IVPB 400 mg  Status:  Discontinued     400 mg 200 mL/hr over 60 Minutes Intravenous Every 12 hours 06/13/14 1150 06/13/14 1620   06/13/14 1800  ertapenem (INVANZ) 1 g in sodium chloride 0.9 % 50 mL IVPB     1 g 100 mL/hr over 30 Minutes Intravenous Every 24 hours 06/13/14 1620     06/13/14 1800  vancomycin (VANCOCIN) 50 mg/mL oral solution 125 mg     125 mg Oral 4 times per day 06/13/14 1620 06/15/14 0641   06/11/14 1200  ciprofloxacin (CIPRO) IVPB 400 mg  Status:  Discontinued     400 mg 200 mL/hr over 60 Minutes Intravenous Every 12 hours 06/11/14 1152 06/13/14 1148   06/11/14 1200  metroNIDAZOLE (FLAGYL) IVPB 500 mg  Status:  Discontinued     500 mg 100 mL/hr over 60 Minutes Intravenous Every 8 hours 06/11/14 1152 06/13/14 1620   06/11/14 1045  ciprofloxacin (CIPRO) IVPB 400 mg     400 mg 200 mL/hr over 60 Minutes Intravenous  Once 06/11/14 1040 06/11/14 1153   06/11/14 1045  metroNIDAZOLE (FLAGYL) IVPB 500 mg     500 mg 100 mL/hr over 60 Minutes Intravenous  Once 06/11/14 1040 06/11/14 1153  Assessment/Plan Smoldering recurrent diverticulitis - On Cipro/Flagyl - 2/15 >>> 06/13/14 now on Invanz & Vancomycin 06/13/14 >>> C. 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 Tolerating soft diet, IVF, pain control, antiemetics, antibiotics (now on Invanz & Vancomycin 06/13/14 >>>) 2. SCD's and VTE proph per medicine 3. Ambulate and IS 4. Given mixed picture at this point with now c.diff and likely improving diverticulitis, surgery will need to be delayed until the c.diff infection is resolved. He will be treated for 42 days with Vancomycin for c.diff & 21 days of Invanz. 5. Will arrange for follow up with Dr. Hassell Done in 1 month to discuss if surgical interventions are needed at that  time for his diverticulitis 6. He had a colonoscopy last summer - he's seen Dr. Michail Sermon in the past 7. Home today with [place], would given him #30 vicodin q6prn 8.  Would recommend at least 1 week off from work, but up to 2 weeks from surgical perspective.  Would check with ID to see if he can go back sooner or later secondary to contact precautions.   9.  Recommend low residual/soft/bland diet    LOS: 4 days    DORT, MEGAN 06/15/2014, 7:35 AM Pager: [phone]  Agree with above. Will follow the recommendations of ID - will need PICC to finish a total of 3 weeks of Invanz.  Will also be on lengthy course of oral Vanc. Should see Dr. Hassell Done at the end of his antibiotic course.  Alphonsa Overall, MD, [facility] Pager: [phone] Office phone:  [phone]

## 2014-06-15 NOTE — Progress Notes (Signed)
Peripherally Inserted Central Catheter/Midline Placement  The IV Nurse has discussed with the patient and/or persons authorized to consent for the patient, the purpose of this procedure and the potential benefits and risks involved with this procedure.  The benefits include less needle sticks, lab draws from the catheter and patient may be discharged home with the catheter.  Risks include, but not limited to, infection, bleeding, blood clot (thrombus formation), and puncture of an artery; nerve damage and irregular heat beat.  Alternatives to this procedure were also discussed.  PICC/Midline Placement Documentation        Nathaniel Chambers 06/15/2014, 3:37 PM

## 2014-06-16 LAB — COMPREHENSIVE METABOLIC PANEL
ALK PHOS: 50 U/L (ref 39–117)
ALT: 38 U/L (ref 0–53)
AST: 38 U/L — AB (ref 0–37)
Albumin: 3.5 g/dL (ref 3.5–5.2)
Anion gap: 5 (ref 5–15)
BUN: 8 mg/dL (ref 6–23)
CO2: 27 mmol/L (ref 19–32)
Calcium: 8.7 mg/dL (ref 8.4–10.5)
Chloride: 104 mmol/L (ref 96–112)
Creatinine, Ser: 0.94 mg/dL (ref 0.50–1.35)
GFR calc Af Amer: >90 mL/min (ref >90)
Glucose, Bld: 110 mg/dL — ABNORMAL HIGH (ref 70–99)
Potassium: 3.5 mmol/L (ref 3.5–5.1)
SODIUM: 136 mmol/L (ref 135–145)
Total Bilirubin: 0.4 mg/dL (ref 0.3–1.2)
Total Protein: 6.6 g/dL (ref 6.0–8.3)

## 2014-06-16 LAB — CBC
HCT: 42.3 % (ref 39.0–52.0)
HEMOGLOBIN: 13.6 g/dL (ref 13.0–17.0)
MCH: 27.9 pg (ref 26.0–34.0)
MCHC: 32.2 g/dL (ref 30.0–36.0)
MCV: 86.9 fL (ref 78.0–100.0)
Platelets: 276 10*3/uL (ref 150–400)
RBC: 4.87 MIL/uL (ref 4.22–5.81)
RDW: 13.8 % (ref 11.5–15.5)
WBC: 7.1 10*3/uL (ref 4.0–10.5)

## 2014-06-16 MED ORDER — BISACODYL 10 MG RE SUPP
10.0000 mg | Freq: Once | RECTAL | Status: AC
  Administered 2014-06-16: 10 mg via RECTAL
  Filled 2014-06-16: qty 1

## 2014-06-16 MED ORDER — PEG-KCL-NACL-NASULF-NA ASC-C 100 G PO SOLR: 1.0000 | Freq: Every day | ORAL | Status: DC | PRN

## 2014-06-16 MED ORDER — LACTULOSE 10 GM/15ML PO SOLN
30.0000 g | Freq: Once | ORAL | Status: AC
  Administered 2014-06-16: 30 g via ORAL
  Filled 2014-06-16: qty 45

## 2014-06-16 MED ORDER — PEG-KCL-NACL-NASULF-NA ASC-C 100 G PO SOLR
1.0000 | Freq: Once | ORAL | Status: AC
  Administered 2014-06-16: 200 g via ORAL
  Filled 2014-06-16: qty 1

## 2014-06-16 MED ORDER — VANCOMYCIN 50 MG/ML ORAL SOLUTION: 125.0000 mg | Freq: Every day | ORAL | Status: DC

## 2014-06-16 MED ORDER — HEPARIN SOD (PORK) LOCK FLUSH 100 UNIT/ML IV SOLN
250.0000 [IU] | INTRAVENOUS | Status: AC | PRN
  Administered 2014-06-16: 250 [IU]

## 2014-06-16 MED ORDER — VANCOMYCIN 50 MG/ML ORAL SOLUTION: 125.0000 mg | ORAL | Status: DC

## 2014-06-16 MED ORDER — HYDROCODONE-ACETAMINOPHEN 5-325 MG PO TABS: 1.0000 | ORAL_TABLET | ORAL | Status: DC | PRN

## 2014-06-16 MED ORDER — VANCOMYCIN 50 MG/ML ORAL SOLUTION: 125.0000 mg | Freq: Four times a day (QID) | ORAL | Status: DC

## 2014-06-16 MED ORDER — POLYETHYLENE GLYCOL 3350 17 G PO PACK: 17.0000 g | PACK | Freq: Every day | ORAL | Status: DC | PRN

## 2014-06-16 MED ORDER — SORBITOL 70 % SOLN
30.0000 mL | Status: AC
  Administered 2014-06-16: 30 mL via ORAL
  Filled 2014-06-16: qty 30

## 2014-06-16 MED ORDER — ONDANSETRON HCL 4 MG PO TABS: 4.0000 mg | ORAL_TABLET | Freq: Four times a day (QID) | ORAL | Status: DC | PRN

## 2014-06-16 MED ORDER — VANCOMYCIN 50 MG/ML ORAL SOLUTION: 125.0000 mg | Freq: Two times a day (BID) | ORAL | Status: DC

## 2014-06-16 MED ORDER — SACCHAROMYCES BOULARDII 250 MG PO CAPS: 250.0000 mg | ORAL_CAPSULE | Freq: Two times a day (BID) | ORAL | Status: DC

## 2014-06-16 NOTE — Progress Notes (Deleted)
P 

## 2014-06-16 NOTE — Progress Notes (Signed)
Reviewed discharge instructions and medications in detail with pt and daughter at bedside.  Paper Rx and work note given to pt. Pt worried about coming back as abdominal pain has returned.  Pt provided with pain medication and GI cocktial for relief.  MD notified related to pt worries.  Will continue to monitor. Call light in reach.

## 2014-06-16 NOTE — Progress Notes (Addendum)
CARE MANAGEMENT NOTE 06/16/2014  Patient:  GARV, KUECHLE   Account Number:  [id number]  Date Initiated:  06/12/2014  Documentation initiated by:  Sunday Spillers  Subjective/Objective Assessment:   49 yo male admitted with diverticulitis. PTA lived at home with spouse.     Action/Plan:   Home when stable   Anticipated DC Date:  06/15/2014   Anticipated DC Plan:  [place]  CM consult      [facility] Choice  HOME HEALTH   Choice offered to / List presented to:  C-1 Patient        [place] arranged  HH-1 RN  [place].   Status of service:  Completed, signed off Medicare Important Message given?   (If response is "NO", the following Medicare IM given date fields will be blank) Date Medicare IM given:   Medicare IM given by:   Date Additional Medicare IM given:   Additional Medicare IM given by:    Discharge Disposition:  [place]  Per UR Regulation:  Reviewed for med. necessity/level of care/duration of stay  If discussed at [place] of Stay Meetings, dates discussed:    Comments:  06/16/2014  1:44 PM  NCM notified [facility] of scheduled dc home today with IV abx, Invanz. Pt will have next dose 06/17/2014. Faxed Vancomycin Rx to [facility] [address]. Spoke to pharmacist, Sunday Spillers and Rx has to one Rx. Notified attending. Received call from pharmacy and pt has a $91 copay for meds. Made pt aware and he will discuss with his wife. Jonnie Finner RN CCM Case Mgmt phone [phone]  06/16/2014 12:30 Faxed orders for IV abx to [facility]. Spoke to [facility] and they do not have Joice until 06/15/2014. Requested pt receive dose in hospital before dc and they will do soc 2/21.  Jonnie Finner RN CCM Case Mgmt phone [phone]   06-15-14 Sunday Spillers RN CM 1100 Spoke with patient at bedside. States has used [facility] in the past and would like to use them again. Contacted [facility] to  arrange, awaiting final orders and PICC placement.

## 2014-06-16 NOTE — Progress Notes (Signed)
Pt was not d/c today and has additional information for Case Management r/t to his home Vancomycin PO.  Pt would like to stay on liquid Vancomycin, especially if Co-pay may be cheaper.  Pt left number for [facility] [phone] to find out best options to get the medications.  Left message on weekend Case Management make them aware.

## 2014-06-16 NOTE — Progress Notes (Signed)
Patient ID: Nathaniel Chambers, male   DOB: [birth year], 49 y.o.   MRN: [id number]  [facility]  Subjective: Patient comfortable, no BM's this AM.  Mild abdominal pain.  Tolerating diet.  Objective: Vital signs in last 24 hours: Temp:  [97.8 F (36.6 C)-98.7 F (37.1 C)] 97.8 F (36.6 C) (02/20 0545) Pulse Rate:  [65-72] 65 (02/20 0545) Resp:  [18-20] 18 (02/20 0545) BP: (138-148)/(76-92) 138/87 mmHg (02/20 0545) SpO2:  [96 %-98 %] 98 % (02/20 0545) Last BM Date: 06/15/14  Intake/Output from previous day: 02/19 0701 - 02/20 0700 In: 1380 [P.O.:480; I.V.:900] Out: 950 [Urine:950] Intake/Output this shift: Total I/O In: 240 [P.O.:240] Out: -   Physical Exam: HEENT - sclerae clear, mucous membranes moist Neck - soft Chest - clear bilaterally Cor - RRR Abdomen - soft, obese; mild diffuse tenderness; no guarding; BS present Ext - no edema, non-tender Neuro - alert & oriented, no focal deficits  Lab Results:   Recent Labs  06/15/14 0530 06/16/14 0500  WBC 8.0 7.1  HGB 14.6 13.6  HCT 45.1 42.3  PLT 316 276   BMET  Recent Labs  06/15/14 0530 06/16/14 0500  NA 139 136  K 3.6 3.5  CL 106 104  CO2 26 27  GLUCOSE 103* 110*  BUN 7 8  CREATININE 1.06 0.94  CALCIUM 8.8 8.7   PT/INR No results for input(s): LABPROT, INR in the last 72 hours. Comprehensive Metabolic Panel:    Component Value Date/Time   NA 136 06/16/2014 0500   NA 139 06/15/2014 0530   K 3.5 06/16/2014 0500   K 3.6 06/15/2014 0530   CL 104 06/16/2014 0500   CL 106 06/15/2014 0530   CO2 27 06/16/2014 0500   CO2 26 06/15/2014 0530   BUN 8 06/16/2014 0500   BUN 7 06/15/2014 0530   CREATININE 0.94 06/16/2014 0500   CREATININE 1.06 06/15/2014 0530   GLUCOSE 110* 06/16/2014 0500   GLUCOSE 103* 06/15/2014 0530   CALCIUM 8.7 06/16/2014 0500   CALCIUM 8.8 06/15/2014 0530   AST 38* 06/16/2014 0500   AST 20 06/11/2014 0519   ALT 38 06/16/2014 0500   ALT 25  06/11/2014 0519   ALKPHOS 50 06/16/2014 0500   ALKPHOS 71 06/11/2014 0519   BILITOT 0.4 06/16/2014 0500   BILITOT 0.6 06/11/2014 0519   PROT 6.6 06/16/2014 0500   PROT 7.9 06/11/2014 0519   ALBUMIN 3.5 06/16/2014 0500   ALBUMIN 4.3 06/11/2014 0519    Studies/Results: Dg Abd Portable 1v  06/15/2014   CLINICAL DATA:  History of ileus.  EXAM: PORTABLE ABDOMEN - 1 VIEW  COMPARISON:  Abdominal CT 06/11/2014  FINDINGS: The bowel gas pattern is normal. Previously administered oral contrast is still distributed throughout the colon. There is a lap band, which has a normal and unchanged oblique orientation. Status post left groin surgery. No concerning intra-abdominal calcification or mass effect.  IMPRESSION: 1. Normal bowel gas pattern. 2. Normal lap band orientation.   Electronically Signed   By: Monte Fantasia M.D.   On: 06/15/2014 15:17    Anti-infectives: Anti-infectives    Start     Dose/Rate Route Frequency Ordered Stop   07/14/14 1000  vancomycin (VANCOCIN) 50 mg/mL oral solution 125 mg     125 mg Oral Once per day on Mon Wed Fri 06/15/14 1532 07/27/14 0859   07/06/14 1000  vancomycin (VANCOCIN) 50 mg/mL oral solution 125 mg     125 mg Oral  Every other day 06/15/14 1532 07/14/14 0959   06/28/14 1000  vancomycin (VANCOCIN) 50 mg/mL oral solution 125 mg     125 mg Oral Daily 06/15/14 1532 07/05/14 0959   06/20/14 2200  vancomycin (VANCOCIN) 50 mg/mL oral solution 125 mg     125 mg Oral Every 12 hours 06/15/14 1532 06/27/14 2159   06/15/14 1800  vancomycin (VANCOCIN) 50 mg/mL oral solution 125 mg  Status:  Discontinued     125 mg Oral 4 times per day 06/15/14 1519 06/15/14 1527   06/15/14 1800  vancomycin (VANCOCIN) 50 mg/mL oral solution 125 mg     125 mg Oral 4 times per day 06/15/14 1532 06/20/14 1759   06/15/14 0000  ertapenem 1 g in sodium chloride 0.9 % 50 mL    Comments:  Dispense through 07/01/14, zero refills   1 g 100 mL/hr over 30 Minutes Intravenous Every 24 hours 06/15/14  1623     06/13/14 2100  ciprofloxacin (CIPRO) IVPB 400 mg  Status:  Discontinued     400 mg 200 mL/hr over 60 Minutes Intravenous Every 12 hours 06/13/14 1150 06/13/14 1620   06/13/14 1800  ertapenem (INVANZ) 1 g in sodium chloride 0.9 % 50 mL IVPB     1 g 100 mL/hr over 30 Minutes Intravenous Every 24 hours 06/13/14 1620     06/13/14 1800  vancomycin (VANCOCIN) 50 mg/mL oral solution 125 mg     125 mg Oral 4 times per day 06/13/14 1620 06/15/14 0641   06/11/14 1200  ciprofloxacin (CIPRO) IVPB 400 mg  Status:  Discontinued     400 mg 200 mL/hr over 60 Minutes Intravenous Every 12 hours 06/11/14 1152 06/13/14 1148   06/11/14 1200  metroNIDAZOLE (FLAGYL) IVPB 500 mg  Status:  Discontinued     500 mg 100 mL/hr over 60 Minutes Intravenous Every 8 hours 06/11/14 1152 06/13/14 1620   06/11/14 1045  ciprofloxacin (CIPRO) IVPB 400 mg     400 mg 200 mL/hr over 60 Minutes Intravenous  Once 06/11/14 1040 06/11/14 1153   06/11/14 1045  metroNIDAZOLE (FLAGYL) IVPB 500 mg     500 mg 100 mL/hr over 60 Minutes Intravenous  Once 06/11/14 1040 06/11/14 1153      Assessment & Plans: Acute diverticulitis & Cdiff colitis  Hx of lap band by Dr. Kaylyn Lim  Anticipating discharge home on IV Invanz and oral Vancomycin  Will arrange follow up at [place] office with Dr. Hassell Done  Will sign off - call if needed  Earnstine Regal, MD, [facility] Office: [place] 06/16/2014

## 2014-06-16 NOTE — Discharge Summary (Addendum)
Physician Discharge Summary  Nathaniel Chambers BWI:[id number] DOB: 06/11/1965 DOA: 06/11/2014  PCP: [name], MD  Admit date: 06/11/2014 Discharge date: 06/16/2014  Time spent: 25 minutes  Recommendations for Outpatient Follow-up:  1. Follow up with PCP in 1-2 weeks 2. Follow up with General Surgery as scheduled  Discharge Diagnoses:  Principal Problem:   Diverticulitis Active Problems:   Asthma   Obesity   Enteritis due to Clostridium difficile   Diverticulitis of large intestine without perforation or abscess without bleeding   Discharge Condition: Improved  Diet recommendation: Soft diet  Filed Weights   06/11/14 0606 06/11/14 1230  Weight: 154.223 kg (340 lb) 149.687 kg (330 lb)    History of present illness:  Please review h and p from 2/15 for details. Briefly, pt presented with n/v/d with abd pain, found to have sigmoid diverticulitis. Pt was admitted for further work up.  Hospital Course:  1. Diverticulitis 1. General surgery had been following 2. Presenting CT with evidence of diverticulitis, albeit slightly improved from study from 05/31/14 3. Now tolerating an advanced diet 4. Surgery recs to continue total of 42 days of PO vanc prior to considering surgery for diverticulitis 5. ID recs for 16 additional days of Invaz (stop date 3/6) 6. PICC has been placed on 2/19 7. Pt did note epigastric pain with normal follow up abd xray. Some improvement with GI cocktail 2. C.diff colitis likely present on admission 1. Initially on flagyl 2. Appreciate ID input with transition to PO vanc per above 3. Asthma 1. Remained stable 2. No wheezing on exam 4. Obesity 1. Seems stable 2. Pt states he will be more aggressive at losing wt after discharge 5. Hx fatty liver 1. Recent LFT's reviewed and are normal 6. DVT prophylaxis 1. Lovenox subQ  Consultations:  General Surgery  ID  Discharge Exam: Filed Vitals:   06/15/14 1012 06/15/14 1400 06/15/14 2245 06/16/14  0545  BP: 139/83 148/92 138/76 138/87  Pulse: 66 72 65 65  Temp: 97.9 F (36.6 C) 98.7 F (37.1 C) 98.2 F (36.8 C) 97.8 F (36.6 C)  TempSrc: Oral Oral Oral Oral  Resp: 18 20 20 18   Height:      Weight:      SpO2: 96% 96% 97% 98%    General: awake, in nad Cardiovascular: regular, s1, s2 Respiratory: normal resp effort, no wheezing  Discharge Instructions     Medication List    STOP taking these medications        DULoxetine 30 MG capsule  Commonly known as:  CYMBALTA     lidocaine 4 % cream  Commonly known as:  ASPERCREME W/LIDOCAINE     ondansetron 8 MG disintegrating tablet  Commonly known as:  ZOFRAN ODT      TAKE these medications        albuterol 108 (90 BASE) MCG/ACT inhaler  Commonly known as:  PROVENTIL HFA;VENTOLIN HFA  Inhale 2 puffs into the lungs every 6 (six) hours as needed for wheezing or shortness of breath (wheezing).     CIALIS 10 MG tablet  Generic drug:  tadalafil  Take 10 mg by mouth daily as needed for erectile dysfunction (erectile dysfunction).     clotrimazole-betamethasone cream  Commonly known as:  LOTRISONE  Apply 1 application topically 2 (two) times daily as needed (rash).     ertapenem 1 g in sodium chloride 0.9 % 50 mL  Inject 1 g into the vein daily.     Fluticasone-Salmeterol 250-50 MCG/DOSE Aepb  Commonly known as:  ADVAIR  Inhale 1 puff into the lungs 2 (two) times daily.     gabapentin 300 MG capsule  Commonly known as:  NEURONTIN  Take 300 mg by mouth daily as needed (nerve pain).     HYDROcodone-acetaminophen 5-325 MG per tablet  Commonly known as:  NORCO/VICODIN  Take 1-2 tablets by mouth every 4 (four) hours as needed for moderate pain.     ondansetron 4 MG tablet  Commonly known as:  ZOFRAN  Take 1 tablet (4 mg total) by mouth every 6 (six) hours as needed for nausea.     polyethylene glycol packet  Commonly known as:  MIRALAX / GLYCOLAX  Take 17 g by mouth daily.     saccharomyces boulardii 250 MG  capsule  Commonly known as:  FLORASTOR  Take 1 capsule (250 mg total) by mouth 2 (two) times daily.     vancomycin 50 mg/mL oral solution  Commonly known as:  VANCOCIN  Take 2.5 mLs (125 mg total) by mouth every 6 (six) hours.     vancomycin 50 mg/mL oral solution  Commonly known as:  VANCOCIN  Take 2.5 mLs (125 mg total) by mouth every 12 (twelve) hours.  Start taking on:  06/20/2014     vancomycin 50 mg/mL oral solution  Commonly known as:  VANCOCIN  Take 2.5 mLs (125 mg total) by mouth daily.  Start taking on:  06/28/2014     vancomycin 50 mg/mL oral solution  Commonly known as:  VANCOCIN  Take 2.5 mLs (125 mg total) by mouth every other day.  Start taking on:  07/06/2014     vancomycin 50 mg/mL oral solution  Commonly known as:  VANCOCIN  Take 2.5 mLs (125 mg total) by mouth 3 (three) times a week.  Start taking on:  07/14/2014     Vitamin D (Ergocalciferol) 50000 UNITS Caps capsule  Commonly known as:  DRISDOL  Take 50,000 Units by mouth every 7 (seven) days.       Allergies  Allergen Reactions  . Penicillins Shortness Of Breath    Swelling of uvula  . Solu-Medrol [Methylprednisolone Acetate] Anaphylaxis  . Dilaudid [Hydromorphone Hcl] Nausea And Vomiting  . Oxycodone Nausea And Vomiting  . Percocet [Oxycodone-Acetaminophen] Nausea And Vomiting   Follow-up Information    Follow up with MARTIN,MATTHEW B, MD. Schedule an appointment as soon as possible for a visit in 1 month.   Specialty:  General Surgery   Why:  For post-hospital follow up   Contact information:   [address] [place] [address] [phone]       Follow up with [place].   Why:  nurse for ostomy teaching   Contact information:   [address] [place] [address] [phone]       Follow up with [name], MD. Schedule an appointment as soon as possible for a visit in 1 week.   Specialty:  Family Medicine   Contact information:   [place] [address] [phone]        The results of significant diagnostics from this hospitalization (including imaging, microbiology, ancillary and laboratory) are listed below for reference.    Significant Diagnostic Studies: Ct Abdomen Pelvis W Contrast  06/11/2014   CLINICAL DATA:  Diverticulitis diagnosed November 2015. Mid abdominal pain and nausea for 2 weeks.  EXAM: CT ABDOMEN AND PELVIS WITH CONTRAST  TECHNIQUE: Multidetector CT imaging of the  abdomen and pelvis was performed using the standard protocol following bolus administration of intravenous contrast.  CONTRAST:  167mL OMNIPAQUE IOHEXOL 300 MG/ML  SOLN  COMPARISON:  05/31/2014 the [facility]  FINDINGS: BODY WALL: Fatty bilateral inguinal hernias.  LOWER CHEST: Unremarkable.  ABDOMEN/PELVIS:  Liver: Tiny presumed cyst in the deep left liver.  Biliary: No evidence of biliary obstruction or stone.  Pancreas: Unremarkable.  Spleen: Unremarkable.  Adrenals: Unremarkable.  Kidneys and ureters: No hydronephrosis or stone.  Bladder: Unremarkable.  Reproductive: Unremarkable.  Bowel: Lap band which is in stable position. The stomach is distended, but there is no visible gastric outlet obstruction. No concerning wall thickening around the lap band.  Persistent inflammatory change and wall thickening of the mid sigmoid colon, the site of contained perforation on previous CT. The previously gas-filled cavity has collapsed and inflammation has mildly decreased.  Retroperitoneum: No mass or adenopathy.  Peritoneum: No ascites or pneumoperitoneum.  Vascular: No acute abnormality.  OSSEOUS: No acute abnormalities. L5-S1 degenerative disc narrowing and retrolisthesis.  IMPRESSION: Mild improvement of sigmoid diverticulitis since 05/31/2014. The previously noted contained perforation has resolved/collapsed.   Electronically Signed   By: Monte Fantasia M.D.   On: 06/11/2014 08:39   Ct Abdomen Pelvis W  Contrast  05/31/2014   CLINICAL DATA:  Left lower quadrant pain since November, 2015. History of diverticulitis. The patient stopped antibiotic therapy 2 weeks ago.  EXAM: CT ABDOMEN AND PELVIS WITH CONTRAST  TECHNIQUE: Multidetector CT imaging of the abdomen and pelvis was performed using the standard protocol following bolus administration of intravenous contrast.  CONTRAST:  125 ML OMNIPAQUE IOHEXOL 300 MG/ML  SOLN  COMPARISON:  CT abdomen and pelvis 04/17/2014 and 04/08/2014.  FINDINGS: The lung bases are clear.  No pleural or pericardial effusion.  Again seen is sigmoid diverticular disease. Stranding is identified about the mid sigmoid colon and possible small contained perforation is again identified and and appears smaller on today's study. The degree of stranding about the mid sigmoid colon is not notably changed. The colon is otherwise unremarkable. The appendix and small bowel appear normal. Lap band is again seen, unchanged.  The gallbladder, liver, spleen, adrenal glands, pancreas and kidneys appear normal.  No focal bony abnormality is identified with degenerative disease at L5-S1 again seen.  IMPRESSION: Likely small contained perforation in the mid sigmoid colon appears slightly smaller than on the the most recent study. Mild fat stranding about the mid sigmoid colon compatible with residual inflammation is unchanged. The examination is otherwise negative.   Electronically Signed   By: Inge Rise M.D.   On: 05/31/2014 14:29   Dg Abd Portable 1v  06/15/2014   CLINICAL DATA:  History of ileus.  EXAM: PORTABLE ABDOMEN - 1 VIEW  COMPARISON:  Abdominal CT 06/11/2014  FINDINGS: The bowel gas pattern is normal. Previously administered oral contrast is still distributed throughout the colon. There is a lap band, which has a normal and unchanged oblique orientation. Status post left groin surgery. No concerning intra-abdominal calcification or mass effect.  IMPRESSION: 1. Normal bowel gas pattern. 2.  Normal lap band orientation.   Electronically Signed   By: Monte Fantasia M.D.   On: 06/15/2014 15:17    Microbiology: Recent Results (from the past 240 hour(s))  Clostridium Difficile by PCR     Status: Abnormal   Collection Time: 06/12/14  9:50 PM  Result Value Ref Range Status   C difficile by pcr POSITIVE (A) NEGATIVE Final    Comment:  CRITICAL RESULT CALLED TO, READ BACK BY AND VERIFIED WITH: Zachery Dakins RN 11:00 06/13/14 ([email or web address]) Performed at [facility]: Basic Metabolic Panel:  Recent Labs Lab 06/11/14 0519 06/12/14 0515 06/14/14 0500 06/15/14 0530 06/16/14 0500  NA 137 137 139 139 136  K 4.3 3.4* 3.5 3.6 3.5  CL 100 110 102 106 104  CO2 29 24 29 26 27   GLUCOSE 105* 99 89 103* 110*  BUN 12 10 <5* 7 8  CREATININE 1.30 1.17 1.13 1.06 0.94  CALCIUM 9.7 8.3* 9.0 8.8 8.7   Liver Function Tests:  Recent Labs Lab 06/11/14 0519 06/16/14 0500  AST 20 38*  ALT 25 38  ALKPHOS 71 50  BILITOT 0.6 0.4  PROT 7.9 6.6  ALBUMIN 4.3 3.5    Recent Labs Lab 06/11/14 0519  LIPASE 28   No results for input(s): AMMONIA in the last 168 hours. CBC:  Recent Labs Lab 06/11/14 0519 06/12/14 0515 06/14/14 0500 06/15/14 0530 06/16/14 0500  WBC 12.8* 7.7 7.5 8.0 7.1  NEUTROABS 10.8*  --   --   --   --   HGB 15.7 13.1 14.5 14.6 13.6  HCT 48.0 41.8 44.3 45.1 42.3  MCV 88.1 89.3 87.2 87.2 86.9  PLT 322 260 281 316 276   Cardiac Enzymes: No results for input(s): CKTOTAL, CKMB, CKMBINDEX, TROPONINI in the last 168 hours. BNP: BNP (last 3 results) No results for input(s): BNP in the last 8760 hours.  ProBNP (last 3 results) No results for input(s): PROBNP in the last 8760 hours.  CBG:  Recent Labs Lab 06/11/14 2143  GLUCAP 82       Signed:  CHIU, STEPHEN K  [facility] 06/16/2014, 12:42 PM

## 2014-06-17 LAB — CBC
HCT: 42.3 % (ref 39.0–52.0)
HEMOGLOBIN: 13.7 g/dL (ref 13.0–17.0)
MCH: 28.2 pg (ref 26.0–34.0)
MCHC: 32.4 g/dL (ref 30.0–36.0)
MCV: 87.2 fL (ref 78.0–100.0)
PLATELETS: 274 10*3/uL (ref 150–400)
RBC: 4.85 MIL/uL (ref 4.22–5.81)
RDW: 14.1 % (ref 11.5–15.5)
WBC: 8.8 10*3/uL (ref 4.0–10.5)

## 2014-06-17 LAB — BASIC METABOLIC PANEL
Anion gap: 8 (ref 5–15)
BUN: 10 mg/dL (ref 6–23)
CALCIUM: 9 mg/dL (ref 8.4–10.5)
CO2: 25 mmol/L (ref 19–32)
CREATININE: 1.01 mg/dL (ref 0.50–1.35)
Chloride: 107 mmol/L (ref 96–112)
GFR calc Af Amer: >90 mL/min (ref >90)
GFR, EST NON AFRICAN AMERICAN: 86 mL/min — AB (ref >90)
Glucose, Bld: 101 mg/dL — ABNORMAL HIGH (ref 70–99)
POTASSIUM: 3.9 mmol/L (ref 3.5–5.1)
Sodium: 140 mmol/L (ref 135–145)

## 2014-06-17 MED ORDER — HEPARIN SOD (PORK) LOCK FLUSH 100 UNIT/ML IV SOLN
250.0000 [IU] | INTRAVENOUS | Status: AC | PRN
  Administered 2014-06-17: 250 [IU]

## 2014-06-17 MED ORDER — MAGNESIUM HYDROXIDE 400 MG/5ML PO SUSP
960.0000 mL | ORAL | Status: AC
  Administered 2014-06-17: 960 mL via RECTAL
  Filled 2014-06-17: qty 240

## 2014-06-17 NOTE — Progress Notes (Signed)
Pt had not had a BM before 10pm last night. First Liter of Moviprep given to pt. Pt has had 3 loose BMs since prep was taken.Pt stated he doesn't feel as uncomfortable and bloated.  Will continue to monitor.  Rosie Fate

## 2014-06-17 NOTE — Progress Notes (Signed)
[facility] PROGRESS NOTE  Nathaniel Chambers MLY:[id number] DOB: 29-Jul-1965 DOA: 06/11/2014 PCP: [name], MD  Assessment/Plan: 1. Diverticulitis 1. Epigastric pain improved, see below  2. General surgery following 3. Presenting CT with evidence of diverticulitis, albeit improved from study from 05/31/14 4. Now tolerating an advanced diet 5. Surgery recs to continue total of 42 days of PO vanc prior to considering surgery for diverticulitis 6. ID recs for 16 additional days of Invaz (stop date 3/6) 7. PICC has been placed 8. Per my read of xray, stool noted in transverse colon - see below 2. C.diff colitis likely present on admission 1. Initially on flagyl 2. Appreciate ID input with transition to PO vanc per above 3. Asthma 1. Remains stable 2. No wheezing on exam 4. Obesity 1. Seems stable 2. Pt states he will be more aggressive at losing wt after discharge 5. Hx fatty liver 1. Recent LFT's reviewed and are normal 6. DVT prophylaxis 1. Lovenox subQ 7. Suspected constipation with epigastric pain 1. Recent epigastric pain with xray demonstrating stool in the transverse colon 2. Multiple BM noted with cathartics overnight 3. Tolerating PO 4. No signs of bowel obstruction on xray  Code Status: Full Family Communication: Pt in room Disposition Plan: Plan d/c today   Consultants:  General Surgery  ID  Procedures:   Antibiotics:  Ciprofloxacin 2/15>>>2/17  Flagyl 2/15>>> 2/17  Invanz 2/17>>>  PO Vanc 2/17>>>  HPI/Subjective: Epigastric discomfort improved with BM overnight  Objective: Filed Vitals:   06/16/14 1320 06/16/14 2124 06/16/14 2230 06/17/14 0550  BP: 143/104 151/92  134/74  Pulse: 70 72  74  Temp: 98 F (36.7 C) 98.3 F (36.8 C)  98.5 F (36.9 C)  TempSrc: Oral Oral  Oral  Resp: 16 20  20   Height:      Weight:      SpO2: 96% 94% 99% 98%    Intake/Output Summary (Last 24 hours) at 06/17/14 1303 Last data filed at 06/17/14 1000  Gross per 24 hour  Intake 3044.17 ml  Output      0 ml  Net 3044.17 ml   Filed Weights   06/11/14 0606 06/11/14 1230  Weight: 154.223 kg (340 lb) 149.687 kg (330 lb)    Exam:   General:  Awake, laying in bed, in nad  Cardiovascular: regular, s1, s2  Respiratory: normal resp effort, no wheezing  Abdomen: soft,obese, epigastric tenderness  Musculoskeletal: perfused,no clubbing   Data Reviewed: Basic Metabolic Panel:  Recent Labs Lab 06/12/14 0515 06/14/14 0500 06/15/14 0530 06/16/14 0500 06/17/14 0525  NA 137 139 139 136 140  K 3.4* 3.5 3.6 3.5 3.9  CL 110 102 106 104 107  CO2 24 29 26 27 25   GLUCOSE 99 89 103* 110* 101*  BUN 10 <5* 7 8 10   CREATININE 1.17 1.13 1.06 0.94 1.01  CALCIUM 8.3* 9.0 8.8 8.7 9.0   Liver Function Tests:  Recent Labs Lab 06/11/14 0519 06/16/14 0500  AST 20 38*  ALT 25 38  ALKPHOS 71 50  BILITOT 0.6 0.4  PROT 7.9 6.6  ALBUMIN 4.3 3.5    Recent Labs Lab 06/11/14 0519  LIPASE 28   No results for input(s): AMMONIA in the last 168 hours. CBC:  Recent Labs Lab 06/11/14 0519 06/12/14 0515 06/14/14 0500 06/15/14 0530 06/16/14 0500 06/17/14 0525  WBC 12.8* 7.7 7.5 8.0 7.1 8.8  NEUTROABS 10.8*  --   --   --   --   --   HGB 15.7  13.1 14.5 14.6 13.6 13.7  HCT 48.0 41.8 44.3 45.1 42.3 42.3  MCV 88.1 89.3 87.2 87.2 86.9 87.2  PLT 322 260 281 316 276 274   Cardiac Enzymes: No results for input(s): CKTOTAL, CKMB, CKMBINDEX, TROPONINI in the last 168 hours. BNP (last 3 results) No results for input(s): BNP in the last 8760 hours.  ProBNP (last 3 results) No results for input(s): PROBNP in the last 8760 hours.  CBG:  Recent Labs Lab 06/11/14 2143  GLUCAP 82    Recent Results (from the past 240 hour(s))  Clostridium Difficile by PCR     Status: Abnormal   Collection Time: 06/12/14  9:50 PM  Result Value Ref Range Status   C difficile by pcr POSITIVE (A) NEGATIVE Final    Comment: CRITICAL RESULT CALLED TO, READ  BACK BY AND VERIFIED WITH: Zachery Dakins RN 11:00 06/13/14 ([email or web address]) Performed at [facility]      Studies: Dg Abd Portable 1v  06/15/2014   CLINICAL DATA:  History of ileus.  EXAM: PORTABLE ABDOMEN - 1 VIEW  COMPARISON:  Abdominal CT 06/11/2014  FINDINGS: The bowel gas pattern is normal. Previously administered oral contrast is still distributed throughout the colon. There is a lap band, which has a normal and unchanged oblique orientation. Status post left groin surgery. No concerning intra-abdominal calcification or mass effect.  IMPRESSION: 1. Normal bowel gas pattern. 2. Normal lap band orientation.   Electronically Signed   By: Monte Fantasia M.D.   On: 06/15/2014 15:17    Scheduled Meds: . enoxaparin (LOVENOX) injection  40 mg Subcutaneous Q24H  . ertapenem  1 g Intravenous Q24H  . Fluticasone-Salmeterol  1 puff Inhalation BID  . saccharomyces boulardii  250 mg Oral BID  . vancomycin  125 mg Oral 4 times per day   Followed by  . [START ON 06/20/2014] vancomycin  125 mg Oral Q12H   Followed by  . [START ON 06/28/2014] vancomycin  125 mg Oral Daily   Followed by  . [START ON 07/06/2014] vancomycin  125 mg Oral QODAY   Followed by  . [START ON 07/14/2014] vancomycin  125 mg Oral Once per day on Mon Wed Fri   Continuous Infusions: . sodium chloride 50 mL/hr at 06/16/14 2120    Principal Problem:   Diverticulitis Active Problems:   Asthma   Obesity   Enteritis due to Clostridium difficile   Diverticulitis of large intestine without perforation or abscess without bleeding   [facility], [facility] Pager [phone]. If 7PM-7AM, please contact night-coverage at [email or web address], password [facility] 06/17/2014, 1:03 PM  LOS: 6 days

## 2014-06-17 NOTE — Progress Notes (Signed)
06/17/2014 1500 NCM contacted [facility] and pt will receive IV abx today prior to leaving hospital. [facility] RN will do soc 2/22. Jonnie Finner RN CCM Case Mgmt phone [phone]

## 2014-06-27 ENCOUNTER — Telehealth: Payer: Self-pay | Admitting: *Deleted

## 2014-06-27 NOTE — Telephone Encounter (Signed)
Per Dr. Johnnye Sima, ok to pull PICC at end of IV therapy.  Patient is not scheduled to follow up at [facility].  Order given to [place] at [facility] Landis Gandy, RN

## 2014-06-29 ENCOUNTER — Ambulatory Visit: Payer: BC Managed Care – PPO | Admitting: Neurology

## 2014-07-04 ENCOUNTER — Telehealth: Payer: Self-pay | Admitting: Neurology

## 2014-07-04 NOTE — Telephone Encounter (Signed)
Pt cancelled March follow up w/ Dr. Posey Pronto stating he was sick. Appt marked as no show b/c pt called the day of to cancel appt but a letter was not sent / Sherri S.

## 2014-07-12 ENCOUNTER — Other Ambulatory Visit: Payer: Self-pay | Admitting: *Deleted

## 2014-07-12 ENCOUNTER — Ambulatory Visit (INDEPENDENT_AMBULATORY_CARE_PROVIDER_SITE_OTHER): Payer: BLUE CROSS/BLUE SHIELD | Admitting: Neurology

## 2014-07-12 ENCOUNTER — Encounter: Payer: Self-pay | Admitting: Neurology

## 2014-07-12 VITALS — BP 138/84 | HR 85 | Ht 74.0 in | Wt 328.4 lb

## 2014-07-12 DIAGNOSIS — G5710 Meralgia paresthetica, unspecified lower limb: Secondary | ICD-10-CM

## 2014-07-12 DIAGNOSIS — G5712 Meralgia paresthetica, left lower limb: Secondary | ICD-10-CM

## 2014-07-12 DIAGNOSIS — G5711 Meralgia paresthetica, right lower limb: Secondary | ICD-10-CM

## 2014-07-12 MED ORDER — GABAPENTIN 300 MG PO CAPS: 300.0000 mg | ORAL_CAPSULE | Freq: Two times a day (BID) | ORAL | Status: DC

## 2014-07-12 NOTE — Patient Instructions (Addendum)
Continue neurontin 300mg  twice daily, ok to take extra dose as needed for worsening pain Keep up the good work with your weight loss  Stay well hydrated Return to clinic as needed

## 2014-07-12 NOTE — Progress Notes (Signed)
Follow-up Visit   Date: 07/12/2014    Nathaniel Chambers MRN: [id number] DOB: [birth year]   Interim History: Nathaniel Chambers is a 49 y.o. 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      History of present illness: Since 2014, he started feeling an abnormality behind his thigh and was eventually found to have found to have a mass involving the posterior thigh. 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 Gwyndolyn Saxon Ward in [place]. A few days following surgery, he developed numbness involving the RIGHT anterolateral thigh. Symptoms have worsened over the past several months and now he has extreme sensitivity with sharp pain involving the left thigh. Light pressure, air, and even bed sheets causes severe pain. He tried on amitriptyline and Lyrica which did not help pain, but made him sleepy. He was taking ibuprofen 600mg  following surgery for a brief time, but since stopping it, he feels that pain may have worsened. He also reports having sharp pain involving the left thigh around mid-May.   Prior to surgery, he had no numbness/tingling of the thighs. He has gained 30lb following surgery.   UPDATE 03/30/2014:  Since starting gabapentin 600mg  qhs, he noticed mild improvement of pain but he reports getting very lightheaded with it so is only taking it at night.  From an oncology stand-point, he was recently re-evaluated and there has been no reoccurrence.  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  He is staying very active and trying to loose weight.  Aspercream helps more than lidocaine ointment.  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  He is getting relief with gabapentin 300mg  twice daily.  He has lost 7lb since his last visit.  No new complaints.     Medications:  Current Outpatient Prescriptions on File Prior to Visit  Medication Sig Dispense Refill  . albuterol (PROVENTIL HFA;VENTOLIN HFA) 108 (90 BASE) MCG/ACT inhaler Inhale 2 puffs into the lungs every 6 (six) hours as needed for wheezing or shortness of breath (wheezing).     . clotrimazole-betamethasone (LOTRISONE) cream Apply 1 application topically 2 (two) times daily as needed (rash).   0  . ertapenem 1 g in sodium chloride 0.9 % 50 mL Inject 1 g into the vein daily.    . Fluticasone-Salmeterol (ADVAIR) 250-50 MCG/DOSE AEPB Inhale 1 puff into the lungs 2 (two) times daily.     Marland Kitchen gabapentin (NEURONTIN) 300 MG capsule Take 300 mg by mouth daily as needed (nerve pain).    Marland Kitchen HYDROcodone-acetaminophen (NORCO/VICODIN) 5-325 MG per tablet Take 1-2 tablets by mouth every 4 (four) hours as needed for moderate pain. 30 tablet 0  . ondansetron (ZOFRAN) 4 MG tablet Take 1 tablet (4 mg total) by mouth every 6 (six) hours as needed for nausea. 20 tablet 0  . polyethylene glycol (MIRALAX / GLYCOLAX) packet Take 17 g by mouth daily. (Patient taking differently: Take 17 g by mouth daily as needed for mild constipation. ) 14 each 0  . saccharomyces boulardii (FLORASTOR) 250 MG capsule Take 1 capsule (250 mg total) by mouth 2 (two) times daily. 30 capsule 0  . tadalafil (CIALIS)  10 MG tablet Take 10 mg by mouth daily as needed for erectile dysfunction (erectile dysfunction).     . vancomycin (VANCOCIN) 50 mg/mL oral solution Take 2.5 mLs (125 mg total) by mouth every 6 (six) hours.    . vancomycin (VANCOCIN) 50 mg/mL oral solution Take 2.5 mLs (125 mg total) by mouth every 12 (twelve) hours.    . vancomycin (VANCOCIN) 50 mg/mL oral solution Take 2.5 mLs (125 mg total) by mouth daily.    . vancomycin (VANCOCIN) 50 mg/mL  oral solution Take 2.5 mLs (125 mg total) by mouth every other day.    Derrill Memo ON 07/14/2014] vancomycin (VANCOCIN) 50 mg/mL oral solution Take 2.5 mLs (125 mg total) by mouth 3 (three) times a week.    . Vitamin D, Ergocalciferol, (DRISDOL) 50000 UNITS CAPS capsule Take 50,000 Units by mouth every 7 (seven) days.      No current facility-administered medications on file prior to visit.    Allergies:  Allergies  Allergen Reactions  . Penicillins Shortness Of Breath    Swelling of uvula  . Solu-Medrol [Methylprednisolone Acetate] Anaphylaxis  . Dilaudid [Hydromorphone Hcl] Nausea And Vomiting  . Oxycodone Nausea And Vomiting  . Percocet [Oxycodone-Acetaminophen] Nausea And Vomiting    Review of Systems:  CONSTITUTIONAL: No fevers, chills, night sweats, or weight loss.  EYES: No visual changes or eye pain ENT: No hearing changes.  No history of nose bleeds.   RESPIRATORY: No cough, wheezing and shortness of breath.   CARDIOVASCULAR: Negative for chest pain, and palpitations.   GI: Negative for abdominal discomfort, blood in stools or black stools.  No recent change in bowel habits.   GU:  No history of incontinence.   MUSCLOSKELETAL: No history of joint pain or swelling.  No myalgias.   SKIN: Negative for lesions, rash, and itching.   ENDOCRINE: Negative for cold or heat intolerance, polydipsia or goiter.   PSYCH:  No depression or anxiety symptoms.   NEURO: As Above.   Vital Signs:  BP 138/84 mmHg  Pulse 85  Ht 6\' 2"  (1.88 m)  Wt 328 lb 7 oz (148.978 kg)  BMI 42.15 kg/m2  SpO2 94%  Neurological Exam: MENTAL STATUS including orientation to time, place, and person is normal.  Speech is not dysarthric.  CRANIAL NERVES: Pupils equal round and reactive to light.  No ptosis.  Face is symmetric. Palate elevates symmetrically.  MOTOR:  Motor strength is 5/5 in all extremities.  No pronator drift.  Tone is normal.    MSRs:  Reflexes are 2+/4 throughout, except 1+ Achilles  bilaterally.  SENSORY: Diminished sensation to temperature and light touch over the anterolateral thigh (R >L), .  COORDINATION/GAIT:    Gait narrow based and stable.   Data: CT abdomen pelvis with contrast 09/15/2013: Acute sigmoid diverticulitis.  No drainable fluid collection/abscess. No free air.  MRA head 10/31/2008: 1. Possible moderate 50% stenosis involving the right  vertebrobasilar junction.  2. No occlusions, dissections or aneurysms seen on the study.  MRI brain wo contrast 10/31/2008: 1. No evidence of acute ischemia.  2. Abnormal marrow signal involving the upper one third of the clivus. Differential considerations include marrow changes related  to anemia, smoking, or a neoplastic infiltrative process. Clinical correlation and further evaluation with a dedicated CT scan through the cranial skull base is suggested.  3. Moderate inflammatory thickening of the mucosa in the ethmoids and the maxillary sinuses.   IMPRESSION/PLAN: 1.  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  Morbid obesity  - Praised him for staying active and encouraged him to continue going to the gym as well as eating healthy  3.  Return to clinic as needed   The duration of this appointment visit was 25 minutes of face-to-face time with the patient.  Greater than 50% of this time was spent in counseling, explanation of diagnosis, planning of further management, and coordination of care.   Thank you for allowing me to participate in patient's care.  If I can answer any additional questions, I would be pleased to do so.    Sincerely,    Donika K. Posey Pronto, DO

## 2014-07-23 ENCOUNTER — Ambulatory Visit: Payer: Self-pay | Admitting: Surgery

## 2014-07-30 ENCOUNTER — Telehealth: Payer: Self-pay | Admitting: Neurology

## 2014-07-30 ENCOUNTER — Encounter (HOSPITAL_COMMUNITY): Payer: Self-pay

## 2014-07-30 ENCOUNTER — Encounter (HOSPITAL_COMMUNITY)
Admission: RE | Admit: 2014-07-30 | Discharge: 2014-07-30 | Disposition: A | Discharge status: Home / Self Care | Payer: BLUE CROSS/BLUE SHIELD | Source: Ambulatory Visit | Attending: Surgery | Admitting: Surgery

## 2014-07-30 DIAGNOSIS — Z01812 Encounter for preprocedural laboratory examination: Secondary | ICD-10-CM | POA: Diagnosis present

## 2014-07-30 HISTORY — DX: Malignant (primary) neoplasm, unspecified: C80.1

## 2014-07-30 LAB — BASIC METABOLIC PANEL
ANION GAP: 9 (ref 5–15)
BUN: 10 mg/dL (ref 6–23)
CALCIUM: 9.5 mg/dL (ref 8.4–10.5)
CO2: 24 mmol/L (ref 19–32)
CREATININE: 1.03 mg/dL (ref 0.50–1.35)
Chloride: 105 mmol/L (ref 96–112)
GFR, EST NON AFRICAN AMERICAN: 84 mL/min — AB (ref >90)
Glucose, Bld: 95 mg/dL (ref 70–99)
Potassium: 4.4 mmol/L (ref 3.5–5.1)
Sodium: 138 mmol/L (ref 135–145)

## 2014-07-30 LAB — CBC
HCT: 45.4 % (ref 39.0–52.0)
Hemoglobin: 14.8 g/dL (ref 13.0–17.0)
MCH: 28.7 pg (ref 26.0–34.0)
MCHC: 32.6 g/dL (ref 30.0–36.0)
MCV: 88 fL (ref 78.0–100.0)
PLATELETS: 344 10*3/uL (ref 150–400)
RBC: 5.16 MIL/uL (ref 4.22–5.81)
RDW: 14 % (ref 11.5–15.5)
WBC: 10.9 10*3/uL — ABNORMAL HIGH (ref 4.0–10.5)

## 2014-07-30 NOTE — Telephone Encounter (Signed)
Pt would like a  handicap sticker. He states that he is in a lot of pain and has to walk lot please call patient at [phone]

## 2014-07-30 NOTE — Progress Notes (Signed)
   07/30/14 1247  OBSTRUCTIVE SLEEP APNEA  Have you ever been diagnosed with sleep apnea through a sleep study? No  Do you snore loudly (loud enough to be heard through closed doors)?  1  Do you often feel tired, fatigued, or sleepy during the daytime? 0  Has anyone observed you stop breathing during your sleep? 0  Do you have, or are you being treated for high blood pressure? 1  BMI more than 35 kg/m2? 1  Age over 49 years old? 0  Neck circumference greater than 40 cm/16 inches? 1  Gender: 1

## 2014-07-30 NOTE — Telephone Encounter (Signed)
Please advise 

## 2014-07-30 NOTE — Patient Instructions (Addendum)
ARMON ORVIS  07/30/2014   Your procedure is scheduled on: Friday 08/03/2014  Report to [facility] Main  Entrance and follow signs to               [place] at [place].  Call this number if you have problems the morning of surgery [phone]   Remember: [place] DR[facility]MARTIN'S OFFICE AND CLEAR LIQUID DIET DAY BEFORE SURGERY!   Do not eat food or drink liquids :After Midnight.     Take these medicines the morning of surgery with A SIP OF WATER: Neurontin, use Advair diskus inhaler, use Albuterol inhaler if needed                               You may not have any metal on your body including hair pins and              piercings  Do not wear jewelry, make-up, lotions, powders or perfumes.             Do not wear nail polish.  Do not shave  48 hours prior to surgery.              Men may shave face and neck.   Do not bring valuables to the hospital. [place].  Contacts, dentures or bridgework may not be worn into surgery.  Leave suitcase in the car. After surgery it may be brought to your room.     Patients discharged the day of surgery will not be allowed to drive home.  Name and phone number of your driver:  Special Instructions: N/A              Please read over the following fact sheets you were given: _____________________________________________________________________             [facility] - Preparing for Surgery Before surgery, you can play an important role.  Because skin is not sterile, your skin needs to be as free of germs as possible.  You can reduce the number of germs on your skin by washing with CHG (chlorahexidine gluconate) soap before surgery.  CHG is an antiseptic cleaner which kills germs and bonds with the skin to continue killing germs even after washing. Please DO NOT use if you have an allergy to CHG or antibacterial soaps.  If your skin becomes  reddened/irritated stop using the CHG and inform your nurse when you arrive at Short Stay. Do not shave (including legs and underarms) for at least 48 hours prior to the first CHG shower.  You may shave your face/neck. Please follow these instructions carefully:  1.  Shower with CHG Soap the night before surgery and the  morning of Surgery.  2.  If you choose to wash your hair, wash your hair first as usual with your  normal  shampoo.  3.  After you shampoo, rinse your hair and body thoroughly to remove the  shampoo.                           4.  Use CHG as you would any other liquid soap.  You can apply chg directly  to the skin and  wash                       Gently with a scrungie or clean washcloth.  5.  Apply the CHG Soap to your body ONLY FROM THE NECK DOWN.   Do not use on face/ open                           Wound or open sores. Avoid contact with eyes, ears mouth and genitals (private parts).                       Wash face,  Genitals (private parts) with your normal soap.             6.  Wash thoroughly, paying special attention to the area where your surgery  will be performed.  7.  Thoroughly rinse your body with warm water from the neck down.  8.  DO NOT shower/wash with your normal soap after using and rinsing off  the CHG Soap.                9.  Pat yourself dry with a clean towel.            10.  Wear clean pajamas.            11.  Place clean sheets on your bed the night of your first shower and do not  sleep with pets. Day of Surgery : Do not apply any lotions/deodorants the morning of surgery.  Please wear clean clothes to the hospital/surgery center.  FAILURE TO FOLLOW THESE INSTRUCTIONS MAY RESULT IN THE CANCELLATION OF YOUR SURGERY PATIENT SIGNATURE_________________________________  NURSE SIGNATURE__________________________________  ________________________________________________________________________   Adam Phenix  An incentive spirometer is a tool that  can help keep your lungs clear and active. This tool measures how well you are filling your lungs with each breath. Taking long deep breaths may help reverse or decrease the chance of developing breathing (pulmonary) problems (especially infection) following:  A long period of time when you are unable to move or be active. BEFORE THE PROCEDURE   If the spirometer includes an indicator to show your best effort, your nurse or respiratory therapist will set it to a desired goal.  If possible, sit up straight or lean slightly forward. Try not to slouch.  Hold the incentive spirometer in an upright position. INSTRUCTIONS FOR USE   Sit on the edge of your bed if possible, or sit up as far as you can in bed or on a chair.  Hold the incentive spirometer in an upright position.  Breathe out normally.  Place the mouthpiece in your mouth and seal your lips tightly around it.  Breathe in slowly and as deeply as possible, raising the piston or the ball toward the top of the column.  Hold your breath for 3-5 seconds or for as long as possible. Allow the piston or ball to fall to the bottom of the column.  Remove the mouthpiece from your mouth and breathe out normally.  Rest for a few seconds and repeat Steps 1 through 7 at least 10 times every 1-2 hours when you are awake. Take your time and take a few normal breaths between deep breaths.  The spirometer may include an indicator to show your best effort. Use the indicator as a goal to work toward during each repetition.  After each set of 10 deep  breaths, practice coughing to be sure your lungs are clear. If you have an incision (the cut made at the time of surgery), support your incision when coughing by placing a pillow or rolled up towels firmly against it. Once you are able to get out of bed, walk around indoors and cough well. You may stop using the incentive spirometer when instructed by your caregiver.  RISKS AND COMPLICATIONS  Take your  time so you do not get dizzy or light-headed.  If you are in pain, you may need to take or ask for pain medication before doing incentive spirometry. It is harder to take a deep breath if you are having pain. AFTER USE  Rest and breathe slowly and easily.  It can be helpful to keep track of a log of your progress. Your caregiver can provide you with a simple table to help with this. If you are using the spirometer at home, follow these instructions: [place] IF:   You are having difficultly using the spirometer.  You have trouble using the spirometer as often as instructed.  Your pain medication is not giving enough relief while using the spirometer.  You develop fever of 100.5 F (38.1 C) or higher. SEEK IMMEDIATE MEDICAL CARE IF:   You cough up bloody sputum that had not been present before.  You develop fever of 102 F (38.9 C) or greater.  You develop worsening pain at or near the incision site. MAKE SURE YOU:   Understand these instructions.  Will watch your condition.  Will get help right away if you are not doing well or get worse. Document Released: 08/24/2006 Document Revised: 07/06/2011 Document Reviewed: 10/25/2006 ExitCare Patient Information 2014 ExitCare, [place].   ________________________________________________________________________  WHAT IS A BLOOD TRANSFUSION? Blood Transfusion Information  A transfusion is the replacement of blood or some of its parts. Blood is made up of multiple cells which provide different functions.  Red blood cells carry oxygen and are used for blood loss replacement.  White blood cells fight against infection.  Platelets control bleeding.  Plasma helps clot blood.  Other blood products are available for specialized needs, such as hemophilia or other clotting disorders. BEFORE THE TRANSFUSION  Who gives blood for transfusions?   Healthy volunteers who are fully evaluated to make sure their blood is safe. This  is blood bank blood. Transfusion therapy is the safest it has ever been in the practice of medicine. Before blood is taken from a donor, a complete history is taken to make sure that person has no history of diseases nor engages in risky social behavior (examples are intravenous drug use or sexual activity with multiple partners). The donor's travel history is screened to minimize risk of transmitting infections, such as malaria. The donated blood is tested for signs of infectious diseases, such as HIV and hepatitis. The blood is then tested to be sure it is compatible with you in order to minimize the chance of a transfusion reaction. If you or a relative donates blood, this is often done in anticipation of surgery and is not appropriate for emergency situations. It takes many days to process the donated blood. RISKS AND COMPLICATIONS Although transfusion therapy is very safe and saves many lives, the main dangers of transfusion include:   Getting an infectious disease.  Developing a transfusion reaction. This is an allergic reaction to something in the blood you were given. Every precaution is taken to prevent this. The decision to have a blood transfusion has  been considered carefully by your caregiver before blood is given. Blood is not given unless the benefits outweigh the risks. AFTER THE TRANSFUSION  Right after receiving a blood transfusion, you will usually feel much better and more energetic. This is especially true if your red blood cells have gotten low (anemic). The transfusion raises the level of the red blood cells which carry oxygen, and this usually causes an energy increase.  The nurse administering the transfusion will monitor you carefully for complications. HOME CARE INSTRUCTIONS  No special instructions are needed after a transfusion. You may find your energy is better. Speak with your caregiver about any limitations on activity for underlying diseases you may have. SEEK  MEDICAL CARE IF:   Your condition is not improving after your transfusion.  You develop redness or irritation at the intravenous (IV) site. SEEK IMMEDIATE MEDICAL CARE IF:  Any of the following symptoms occur over the next 12 hours:  Shaking chills.  You have a temperature by mouth above 102 F (38.9 C), not controlled by medicine.  Chest, back, or muscle pain.  People around you feel you are not acting correctly or are confused.  Shortness of breath or difficulty breathing.  Dizziness and fainting.  You get a rash or develop hives.  You have a decrease in urine output.  Your urine turns a dark color or changes to pink, red, or brown. Any of the following symptoms occur over the next 10 days:  You have a temperature by mouth above 102 F (38.9 C), not controlled by medicine.  Shortness of breath.  Weakness after normal activity.  The white part of the eye turns yellow (jaundice).  You have a decrease in the amount of urine or are urinating less often.  Your urine turns a dark color or changes to pink, red, or brown. Document Released: 04/10/2000 Document Revised: 07/06/2011 Document Reviewed: 11/28/2007 ExitCare Patient Information 2014 ExitCare, [place].  _______________________________________________________________________   CLEAR LIQUID DIET   Foods Allowed                                                                     Foods Excluded  Coffee and tea, regular and decaf                             liquids that you cannot  Plain Jell-O in any flavor                                             see through such as: Fruit ices (not with fruit pulp)                                     milk, soups, orange juice  Iced Popsicles                                    All solid food Carbonated beverages, regular and diet  Cranberry, grape and apple juices Sports drinks like Gatorade Lightly seasoned clear broth or consume(fat  free) Sugar, honey syrup  Sample Menu Breakfast                                Lunch                                     Supper Cranberry juice                    Beef broth                            Chicken broth Jell-O                                     Grape juice                           Apple juice Coffee or tea                        Jell-O                                      Popsicle                                                Coffee or tea                        Coffee or tea  _____________________________________________________________________

## 2014-07-30 NOTE — Telephone Encounter (Signed)
If his pain is related to his thighs, ok to give temporary placard, but at the last visit he said that pain was controlled.  He may want to f/u with his PCP if pain is non-neurologic.

## 2014-07-30 NOTE — Progress Notes (Signed)
Amy , Portable Equipment notified of patient needing a Bari-Bed after surgery.

## 2014-07-31 LAB — HEMOGLOBIN A1C
HEMOGLOBIN A1C: 5.3 % (ref 4.8–5.6)
MEAN PLASMA GLUCOSE: 105 mg/dL

## 2014-07-31 NOTE — Telephone Encounter (Signed)
Application for place card is complete and at front desk ready for pick up

## 2014-08-02 NOTE — H&P (Signed)
Chief Complaint:  Recurrent sigmoid diverticulitis  History of Present Illness:  Nathaniel Chambers is an 49 y.o. male who has been admitted multiple times with recurrent flair ups of diverticulitis seen on CT.  This has become somewhat refractory and he wants to proceed with surgery.  He is aware that this could possibly result in a temporary colostomy.    Past Medical History  Diagnosis Date  . Asthma   . Pneumonia     history of  . 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 Leonides Schanz  . Meralgia paraesthetica March 2015    Bilaterally  . Diverticulitis   . Cancer     desmoyd fibromytosis    Past Surgical History  Procedure Laterality Date  . Laparoscopic gastric banding  04-26-2007  . Repair recurrent right inguinal hernia  10-22-2008  . Surg. for undescended testicles/ bilateral inguinal hernia repair  AGE 51 MON OLD  . Left shoulder surg  2000    rotator cuff  . Hernia repair  08/2008    rt inguinal  . Shoulder arthroscopy  01/2009    rt   . Shoulder arthroscopy  07/01/2011    Procedure: ARTHROSCOPY SHOULDER;  Surgeon: Magnus Sinning, MD;  Location: [facility];  Service: Orthopedics;  Laterality: Left;  WITH LABRAL DEBRIDEMENT  . Shoulder open rotator cuff repair  07/01/2011    Procedure: ROTATOR CUFF REPAIR SHOULDER OPEN;  Surgeon: Magnus Sinning, MD;  Location: [facility];  Service: Orthopedics;  Laterality: Left;  OPEN DISTAL CLAVICLE REPAIR  . Vasectomy    . Leg surgery      cancer    No current facility-administered medications for this encounter.   Current Outpatient Prescriptions  Medication Sig Dispense Refill  . albuterol (PROVENTIL HFA;VENTOLIN HFA) 108 (90 BASE) MCG/ACT inhaler Inhale 2 puffs into the lungs every 6 (six) hours as needed for wheezing or shortness of breath (wheezing).     . clotrimazole-betamethasone (LOTRISONE) cream Apply 1 application topically 2 (two) times daily as needed (rash).   0  . Fluticasone-Salmeterol  (ADVAIR) 250-50 MCG/DOSE AEPB Inhale 1 puff into the lungs 2 (two) times daily.     Marland Kitchen gabapentin (NEURONTIN) 300 MG capsule Take 1 capsule (300 mg total) by mouth 2 (two) times daily. 180 capsule 3  . HYDROcodone-acetaminophen (NORCO/VICODIN) 5-325 MG per tablet Take 1-2 tablets by mouth every 4 (four) hours as needed for moderate pain. 30 tablet 0  . ondansetron (ZOFRAN) 4 MG tablet Take 1 tablet (4 mg total) by mouth every 6 (six) hours as needed for nausea. 20 tablet 0  . polyethylene glycol (MIRALAX / GLYCOLAX) packet Take 17 g by mouth daily. (Patient taking differently: Take 17 g by mouth daily as needed for mild constipation. ) 14 each 0  . promethazine (PHENERGAN) 25 MG tablet Take 25 mg by mouth every 4 (four) hours as needed for nausea.   0  . saccharomyces boulardii (FLORASTOR) 250 MG capsule Take 1 capsule (250 mg total) by mouth 2 (two) times daily. 30 capsule 0  . tadalafil (CIALIS) 10 MG tablet Take 10 mg by mouth daily as needed for erectile dysfunction (erectile dysfunction).     . vancomycin (VANCOCIN) 50 mg/mL oral solution Take 2.5 mLs (125 mg total) by mouth 3 (three) times a week.    . Vitamin D, Ergocalciferol, (DRISDOL) 50000 UNITS CAPS capsule Take 50,000 Units by mouth every 7 (seven) days. Days of week vary.    Marland Kitchen  ertapenem 1 g in sodium chloride 0.9 % 50 mL Inject 1 g into the vein daily. (Patient not taking: Reported on 07/27/2014)    . vancomycin (VANCOCIN) 50 mg/mL oral solution Take 2.5 mLs (125 mg total) by mouth every other day. (Patient not taking: Reported on 07/27/2014)     Penicillins; Solu-medrol; Dilaudid; Oxycodone; and Percocet Family History  Problem Relation Age of Onset  . Heart disease Father   . Cancer Mother     kidney, spleen   Social History:   reports that he quit smoking about 26 years ago. He has quit using smokeless tobacco. He reports that he does not drink alcohol or use illicit drugs.   REVIEW OF SYSTEMS : Negative except for see problem  list.  Physical Exam:   There were no vitals taken for this visit. There is no weight on file to calculate BMI.  Gen:  WDWN AAM NAD  Neurological: Alert and oriented to person, place, and time. Motor and sensory function is grossly intact  Head: Normocephalic and atraumatic.  Eyes: Conjunctivae are normal. Pupils are equal, round, and reactive to light. No scleral icterus.  Neck: Normal range of motion. Neck supple. No tracheal deviation or thyromegaly present.  Cardiovascular:  SR without murmurs or gallops.  No carotid bruits Breast:  Not examined Respiratory: Effort normal.  No respiratory distress. No chest wall tenderness. Breath sounds normal.  No wheezes, rales or rhonchi.  Abdomen:  lapband port in place;  Chronic tenderness in the pelvis more on the left GU:  Normal male Musculoskeletal: Normal range of motion. Extremities are nontender. No cyanosis, edema or clubbing noted Lymphadenopathy: No cervical, preauricular, postauricular or axillary adenopathy is present Skin: Skin is warm and dry. No rash noted. No diaphoresis. No erythema. No pallor. Pscyh: Normal mood and affect. Behavior is normal. Judgment and thought content normal.   LABORATORY RESULTS: No results found for this or any previous visit (from the past 48 hour(s)).   RADIOLOGY RESULTS: No results found.  Problem List: Patient Active Problem List   Diagnosis Date Noted  . Meralgia paraesthetica 07/12/2014  . Diverticulitis of large intestine without perforation or abscess without bleeding   . Enteritis due to Clostridium difficile   . Obesity 04/10/2014  . Colonic diverticular abscess 04/04/2014  . Sigmoid diverticulitis 04/04/2014  . Elevated lipase 12/19/2013  . Diverticulitis 12/15/2013  . Asthma 12/15/2013  . Fatty liver 12/15/2013  . 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  Plan lap assisted sigmoid colectomy    Matt B. Hassell Done, MD, [facility] [phone] beeper [phone]  08/02/2014 8:03 PM

## 2014-08-03 ENCOUNTER — Inpatient Hospital Stay (HOSPITAL_COMMUNITY)
Admission: RE | Admit: 2014-08-03 | Discharge: 2014-08-11 | DRG: 330 | Disposition: A | Discharge status: Home / Self Care | Payer: BLUE CROSS/BLUE SHIELD | Source: Ambulatory Visit | Attending: Surgery | Admitting: Surgery

## 2014-08-03 ENCOUNTER — Encounter (HOSPITAL_COMMUNITY): Payer: Self-pay | Admitting: *Deleted

## 2014-08-03 ENCOUNTER — Inpatient Hospital Stay (HOSPITAL_COMMUNITY): Payer: BLUE CROSS/BLUE SHIELD | Admitting: Anesthesiology

## 2014-08-03 ENCOUNTER — Encounter (HOSPITAL_COMMUNITY)
Admission: RE | Disposition: A | Discharge status: Home / Self Care | Payer: Self-pay | Source: Ambulatory Visit | Attending: Surgery

## 2014-08-03 DIAGNOSIS — Z79899 Other long term (current) drug therapy: Secondary | ICD-10-CM | POA: Diagnosis not present

## 2014-08-03 DIAGNOSIS — R109 Unspecified abdominal pain: Secondary | ICD-10-CM

## 2014-08-03 DIAGNOSIS — G5710 Meralgia paresthetica, unspecified lower limb: Secondary | ICD-10-CM | POA: Diagnosis present

## 2014-08-03 DIAGNOSIS — Z87891 Personal history of nicotine dependence: Secondary | ICD-10-CM | POA: Diagnosis not present

## 2014-08-03 DIAGNOSIS — J45909 Unspecified asthma, uncomplicated: Secondary | ICD-10-CM | POA: Diagnosis present

## 2014-08-03 DIAGNOSIS — K5720 Diverticulitis of large intestine with perforation and abscess without bleeding: Secondary | ICD-10-CM | POA: Diagnosis present

## 2014-08-03 DIAGNOSIS — K5732 Diverticulitis of large intestine without perforation or abscess without bleeding: Secondary | ICD-10-CM | POA: Diagnosis present

## 2014-08-03 DIAGNOSIS — R1032 Left lower quadrant pain: Secondary | ICD-10-CM

## 2014-08-03 DIAGNOSIS — E669 Obesity, unspecified: Secondary | ICD-10-CM | POA: Diagnosis present

## 2014-08-03 DIAGNOSIS — Z6841 Body Mass Index (BMI) 40.0 and over, adult: Secondary | ICD-10-CM | POA: Diagnosis not present

## 2014-08-03 DIAGNOSIS — K660 Peritoneal adhesions (postprocedural) (postinfection): Secondary | ICD-10-CM | POA: Diagnosis present

## 2014-08-03 DIAGNOSIS — C4920 Malignant neoplasm of connective and soft tissue of unspecified lower limb, including hip: Secondary | ICD-10-CM | POA: Diagnosis present

## 2014-08-03 DIAGNOSIS — Z9049 Acquired absence of other specified parts of digestive tract: Secondary | ICD-10-CM

## 2014-08-03 HISTORY — PX: LAPAROSCOPIC SIGMOID COLECTOMY: SHX5928

## 2014-08-03 LAB — CBC
HCT: 45.8 % (ref 39.0–52.0)
Hemoglobin: 15 g/dL (ref 13.0–17.0)
MCH: 28.6 pg (ref 26.0–34.0)
MCHC: 32.8 g/dL (ref 30.0–36.0)
MCV: 87.4 fL (ref 78.0–100.0)
PLATELETS: 309 10*3/uL (ref 150–400)
RBC: 5.24 MIL/uL (ref 4.22–5.81)
RDW: 14 % (ref 11.5–15.5)
WBC: 19.7 10*3/uL — ABNORMAL HIGH (ref 4.0–10.5)

## 2014-08-03 LAB — TYPE AND SCREEN
ABO/RH(D): A POS
ANTIBODY SCREEN: NEGATIVE

## 2014-08-03 LAB — CREATININE, SERUM
Creatinine, Ser: 1.27 mg/dL (ref 0.50–1.35)
GFR calc Af Amer: 75 mL/min — ABNORMAL LOW (ref >90)
GFR calc non Af Amer: 65 mL/min — ABNORMAL LOW (ref >90)

## 2014-08-03 SURGERY — COLECTOMY, SIGMOID, LAPAROSCOPIC
Anesthesia: General | Site: Abdomen

## 2014-08-03 MED ORDER — KCL IN DEXTROSE-NACL 20-5-0.45 MEQ/L-%-% IV SOLN
INTRAVENOUS | Status: AC
  Filled 2014-08-03: qty 1000

## 2014-08-03 MED ORDER — PROMETHAZINE HCL 25 MG/ML IJ SOLN
6.2500 mg | INTRAMUSCULAR | Status: DC | PRN
  Administered 2014-08-03: 6.25 mg via INTRAVENOUS

## 2014-08-03 MED ORDER — GLYCOPYRROLATE 0.2 MG/ML IJ SOLN
INTRAMUSCULAR | Status: DC | PRN
  Administered 2014-08-03: 0.6 mg via INTRAVENOUS

## 2014-08-03 MED ORDER — PEG 3350-KCL-NA BICARB-NACL 420 G PO SOLR: 4000.0000 mL | Freq: Once | ORAL | Status: DC

## 2014-08-03 MED ORDER — FENTANYL CITRATE 0.05 MG/ML IJ SOLN
25.0000 ug | INTRAMUSCULAR | Status: DC | PRN
  Administered 2014-08-03 (×3): 50 ug via INTRAVENOUS

## 2014-08-03 MED ORDER — BUPIVACAINE-EPINEPHRINE 0.25% -1:200000 IJ SOLN
INTRAMUSCULAR | Status: AC
  Filled 2014-08-03: qty 1

## 2014-08-03 MED ORDER — ONDANSETRON HCL 4 MG/2ML IJ SOLN
4.0000 mg | Freq: Four times a day (QID) | INTRAMUSCULAR | Status: DC | PRN
  Administered 2014-08-03 – 2014-08-09 (×13): 4 mg via INTRAVENOUS
  Filled 2014-08-03 (×13): qty 2

## 2014-08-03 MED ORDER — FLUTICASONE-SALMETEROL 250-50 MCG/DOSE IN AEPB
1.0000 | INHALATION_SPRAY | Freq: Two times a day (BID) | RESPIRATORY_TRACT | Status: DC
  Administered 2014-08-04 – 2014-08-11 (×8): 1 via RESPIRATORY_TRACT

## 2014-08-03 MED ORDER — HEPARIN SODIUM (PORCINE) 5000 UNIT/ML IJ SOLN
5000.0000 [IU] | Freq: Three times a day (TID) | INTRAMUSCULAR | Status: DC
  Administered 2014-08-03 – 2014-08-11 (×24): 5000 [IU] via SUBCUTANEOUS
  Filled 2014-08-03 (×26): qty 1

## 2014-08-03 MED ORDER — SODIUM CHLORIDE 0.9 % IJ SOLN
INTRAMUSCULAR | Status: AC
  Filled 2014-08-03: qty 10

## 2014-08-03 MED ORDER — DEXAMETHASONE SODIUM PHOSPHATE 10 MG/ML IJ SOLN
INTRAMUSCULAR | Status: AC
Start: 2014-08-03 — End: 2014-08-03
  Filled 2014-08-03: qty 1

## 2014-08-03 MED ORDER — NEOSTIGMINE METHYLSULFATE 10 MG/10ML IV SOLN
INTRAVENOUS | Status: DC | PRN
  Administered 2014-08-03: 4 mg via INTRAVENOUS

## 2014-08-03 MED ORDER — DIPHENHYDRAMINE HCL 50 MG/ML IJ SOLN: 25.0000 mg | Freq: Four times a day (QID) | INTRAMUSCULAR | Status: DC | PRN

## 2014-08-03 MED ORDER — CHLORHEXIDINE GLUCONATE 4 % EX LIQD: 60.0000 mL | Freq: Once | CUTANEOUS | Status: DC

## 2014-08-03 MED ORDER — FENTANYL CITRATE 0.05 MG/ML IJ SOLN
INTRAMUSCULAR | Status: DC | PRN
Start: 2014-08-03 — End: 2014-08-03
  Administered 2014-08-03: 100 ug via INTRAVENOUS
  Administered 2014-08-03 (×2): 50 ug via INTRAVENOUS
  Administered 2014-08-03 (×2): 100 ug via INTRAVENOUS
  Administered 2014-08-03: 50 ug via INTRAVENOUS
  Administered 2014-08-03 (×2): 100 ug via INTRAVENOUS
  Administered 2014-08-03: 50 ug via INTRAVENOUS

## 2014-08-03 MED ORDER — CEFOTETAN DISODIUM-DEXTROSE 2-2.08 GM-% IV SOLR
INTRAVENOUS | Status: AC
  Filled 2014-08-03: qty 50

## 2014-08-03 MED ORDER — FENTANYL CITRATE 0.05 MG/ML IJ SOLN
INTRAMUSCULAR | Status: AC
  Filled 2014-08-03: qty 2

## 2014-08-03 MED ORDER — FENTANYL CITRATE 0.05 MG/ML IJ SOLN
INTRAMUSCULAR | Status: AC
  Filled 2014-08-03: qty 5

## 2014-08-03 MED ORDER — NEOMYCIN SULFATE 500 MG PO TABS: 1000.0000 mg | ORAL_TABLET | ORAL | Status: DC

## 2014-08-03 MED ORDER — ALVIMOPAN 12 MG PO CAPS
12.0000 mg | ORAL_CAPSULE | Freq: Once | ORAL | Status: DC
  Administered 2014-08-03: 12 mg via ORAL
  Filled 2014-08-03: qty 1

## 2014-08-03 MED ORDER — MORPHINE SULFATE 2 MG/ML IJ SOLN
2.0000 mg | INTRAMUSCULAR | Status: DC | PRN
Start: 2014-08-03 — End: 2014-08-09
  Administered 2014-08-03 – 2014-08-09 (×35): 4 mg via INTRAVENOUS
  Filled 2014-08-03 (×35): qty 2

## 2014-08-03 MED ORDER — 0.9 % SODIUM CHLORIDE (POUR BTL) OPTIME
TOPICAL | Status: DC | PRN
  Administered 2014-08-03: 4000 mL

## 2014-08-03 MED ORDER — ONDANSETRON HCL 4 MG/2ML IJ SOLN
INTRAMUSCULAR | Status: AC
  Filled 2014-08-03: qty 2

## 2014-08-03 MED ORDER — LACTATED RINGERS IV SOLN
INTRAVENOUS | Status: DC | PRN
  Administered 2014-08-03 (×3): via INTRAVENOUS

## 2014-08-03 MED ORDER — SUCCINYLCHOLINE CHLORIDE 20 MG/ML IJ SOLN
INTRAMUSCULAR | Status: DC | PRN
  Administered 2014-08-03: 140 mg via INTRAVENOUS

## 2014-08-03 MED ORDER — DEXTROSE 5 % IV SOLN
2.0000 g | INTRAVENOUS | Status: DC
  Filled 2014-08-03: qty 2

## 2014-08-03 MED ORDER — MIDAZOLAM HCL 5 MG/5ML IJ SOLN
INTRAMUSCULAR | Status: DC | PRN
  Administered 2014-08-03 (×2): 1 mg via INTRAVENOUS

## 2014-08-03 MED ORDER — EPHEDRINE SULFATE 50 MG/ML IJ SOLN
INTRAMUSCULAR | Status: DC | PRN
  Administered 2014-08-03 (×2): 5 mg via INTRAVENOUS

## 2014-08-03 MED ORDER — PROMETHAZINE HCL 25 MG/ML IJ SOLN
INTRAMUSCULAR | Status: AC
  Filled 2014-08-03: qty 1

## 2014-08-03 MED ORDER — LIDOCAINE HCL (CARDIAC) 20 MG/ML IV SOLN
INTRAVENOUS | Status: DC | PRN
  Administered 2014-08-03: 100 mg via INTRAVENOUS

## 2014-08-03 MED ORDER — NON FORMULARY: 1.0000 | Freq: Two times a day (BID) | Status: DC

## 2014-08-03 MED ORDER — LIDOCAINE HCL (CARDIAC) 20 MG/ML IV SOLN
INTRAVENOUS | Status: AC
  Filled 2014-08-03: qty 5

## 2014-08-03 MED ORDER — HEPARIN SODIUM (PORCINE) 5000 UNIT/ML IJ SOLN
5000.0000 [IU] | Freq: Once | INTRAMUSCULAR | Status: AC
  Administered 2014-08-03: 5000 [IU] via SUBCUTANEOUS
  Filled 2014-08-03: qty 1

## 2014-08-03 MED ORDER — ROCURONIUM BROMIDE 100 MG/10ML IV SOLN
INTRAVENOUS | Status: AC
Start: 2014-08-03 — End: 2014-08-03
  Filled 2014-08-03: qty 1

## 2014-08-03 MED ORDER — MIDAZOLAM HCL 2 MG/2ML IJ SOLN
INTRAMUSCULAR | Status: AC
  Filled 2014-08-03: qty 2

## 2014-08-03 MED ORDER — ALBUTEROL SULFATE (2.5 MG/3ML) 0.083% IN NEBU
2.5000 mg | INHALATION_SOLUTION | Freq: Four times a day (QID) | RESPIRATORY_TRACT | Status: DC | PRN
  Administered 2014-08-03 – 2014-08-11 (×7): 2.5 mg via RESPIRATORY_TRACT
  Filled 2014-08-03 (×9): qty 3

## 2014-08-03 MED ORDER — DEXTROSE 5 % IV SOLN
2.0000 g | INTRAVENOUS | Status: DC
  Administered 2014-08-03: 2 g via INTRAVENOUS

## 2014-08-03 MED ORDER — KCL IN DEXTROSE-NACL 20-5-0.45 MEQ/L-%-% IV SOLN
INTRAVENOUS | Status: DC
  Administered 2014-08-03 – 2014-08-06 (×8): via INTRAVENOUS
  Administered 2014-08-07: 1000 mL via INTRAVENOUS
  Administered 2014-08-08: 17:00:00 via INTRAVENOUS
  Administered 2014-08-10: 75 mL/h via INTRAVENOUS
  Administered 2014-08-10: 16:00:00 via INTRAVENOUS
  Administered 2014-08-11 (×2): 75 mL/h via INTRAVENOUS
  Filled 2014-08-03 (×18): qty 1000

## 2014-08-03 MED ORDER — ERYTHROMYCIN BASE 250 MG PO TABS
1000.0000 mg | ORAL_TABLET | ORAL | Status: DC
Start: 2014-08-03 — End: 2014-08-03

## 2014-08-03 MED ORDER — LACTATED RINGERS IR SOLN
Status: DC | PRN
  Administered 2014-08-03: 1000 mL

## 2014-08-03 MED ORDER — DIPHENHYDRAMINE HCL 25 MG PO CAPS: 25.0000 mg | ORAL_CAPSULE | Freq: Four times a day (QID) | ORAL | Status: DC | PRN

## 2014-08-03 MED ORDER — PROPOFOL 10 MG/ML IV BOLUS
INTRAVENOUS | Status: AC
  Filled 2014-08-03: qty 20

## 2014-08-03 MED ORDER — ROCURONIUM BROMIDE 100 MG/10ML IV SOLN
INTRAVENOUS | Status: AC
  Filled 2014-08-03: qty 1

## 2014-08-03 MED ORDER — ONDANSETRON HCL 4 MG/2ML IJ SOLN
INTRAMUSCULAR | Status: DC | PRN
  Administered 2014-08-03: 4 mg via INTRAVENOUS

## 2014-08-03 MED ORDER — FENTANYL CITRATE 0.05 MG/ML IJ SOLN
12.5000 ug | INTRAMUSCULAR | Status: DC | PRN
  Administered 2014-08-03: 12.5 ug via INTRAVENOUS
  Filled 2014-08-03: qty 2

## 2014-08-03 MED ORDER — BUPIVACAINE-EPINEPHRINE 0.25% -1:200000 IJ SOLN
INTRAMUSCULAR | Status: DC | PRN
  Administered 2014-08-03: 20 mL

## 2014-08-03 MED ORDER — ACETAMINOPHEN 10 MG/ML IV SOLN
1000.0000 mg | Freq: Once | INTRAVENOUS | Status: AC
  Administered 2014-08-03: 1000 mg via INTRAVENOUS
  Filled 2014-08-03: qty 100

## 2014-08-03 MED ORDER — ROCURONIUM BROMIDE 100 MG/10ML IV SOLN
INTRAVENOUS | Status: DC | PRN
  Administered 2014-08-03 (×4): 20 mg via INTRAVENOUS
  Administered 2014-08-03: 40 mg via INTRAVENOUS

## 2014-08-03 MED ORDER — DEXTROSE 5 % IV SOLN
2.0000 g | Freq: Two times a day (BID) | INTRAVENOUS | Status: AC
  Administered 2014-08-03: 2 g via INTRAVENOUS
  Filled 2014-08-03: qty 2

## 2014-08-03 MED ORDER — EPHEDRINE SULFATE 50 MG/ML IJ SOLN
INTRAMUSCULAR | Status: AC
  Filled 2014-08-03: qty 1

## 2014-08-03 MED ORDER — ONDANSETRON HCL 4 MG PO TABS: 4.0000 mg | ORAL_TABLET | Freq: Four times a day (QID) | ORAL | Status: DC | PRN

## 2014-08-03 MED ORDER — ALVIMOPAN 12 MG PO CAPS: 12.0000 mg | ORAL_CAPSULE | Freq: Once | ORAL | Status: DC

## 2014-08-03 MED ORDER — LACTATED RINGERS IV SOLN: INTRAVENOUS | Status: DC

## 2014-08-03 MED ORDER — PROPOFOL 10 MG/ML IV BOLUS
INTRAVENOUS | Status: DC | PRN
  Administered 2014-08-03: 350 mg via INTRAVENOUS

## 2014-08-03 SURGICAL SUPPLY — 85 items
APPLIER CLIP 5 13 M/L LIGAMAX5 (MISCELLANEOUS)
APPLIER CLIP ROT 10 11.4 M/L (STAPLE)
APR CLP MED LRG 11.4X10 (STAPLE)
APR CLP MED LRG 5 ANG JAW (MISCELLANEOUS)
BAG SPEC RTRVL LRG 6X4 10 (ENDOMECHANICALS) ×1
BLADE EXTENDED COATED 6.5IN (ELECTRODE) IMPLANT
BLADE HEX COATED 2.75 (ELECTRODE) ×2 IMPLANT
CELLS DAT CNTRL 66122 CELL SVR (MISCELLANEOUS) IMPLANT
CHLORAPREP W/TINT 26ML (MISCELLANEOUS) ×2 IMPLANT
CLAMP ENDO BABCK 10MM (STAPLE) IMPLANT
CLIP APPLIE 5 13 M/L LIGAMAX5 (MISCELLANEOUS) IMPLANT
CLIP APPLIE ROT 10 11.4 M/L (STAPLE) IMPLANT
CONNECTOR 5 IN 1 STRAIGHT STRL (MISCELLANEOUS) IMPLANT
COVER SURGICAL LIGHT HANDLE (MISCELLANEOUS) ×1 IMPLANT
CUTTER FLEX LINEAR 45M (STAPLE) ×1 IMPLANT
DECANTER SPIKE VIAL GLASS SM (MISCELLANEOUS) ×2 IMPLANT
DEVICE TROCAR PUNCTURE CLOSURE (ENDOMECHANICALS) IMPLANT
DRAPE LAPAROSCOPIC ABDOMINAL (DRAPES) ×1 IMPLANT
DRSG OPSITE POSTOP 4X8 (GAUZE/BANDAGES/DRESSINGS) ×1 IMPLANT
ELECT REM PT RETURN 9FT ADLT (ELECTROSURGICAL) ×2
ELECTRODE REM PT RTRN 9FT ADLT (ELECTROSURGICAL) ×1 IMPLANT
GAUZE SPONGE 4X4 12PLY STRL (GAUZE/BANDAGES/DRESSINGS) ×1 IMPLANT
GLOVE BIOGEL M 8.0 STRL (GLOVE) ×4 IMPLANT
GLOVE BIOGEL PI IND STRL 7.0 (GLOVE) ×1 IMPLANT
GLOVE BIOGEL PI INDICATOR 7.0 (GLOVE) ×8
GOWN SPEC L4 XLG W/TWL (GOWN DISPOSABLE) ×1 IMPLANT
GOWN STRL REUS W/TWL LRG LVL3 (GOWN DISPOSABLE) ×1 IMPLANT
GOWN STRL REUS W/TWL XL LVL3 (GOWN DISPOSABLE) ×11 IMPLANT
HOLDER FOLEY CATH W/STRAP (MISCELLANEOUS) ×1 IMPLANT
LEGGING LITHOTOMY PAIR STRL (DRAPES) IMPLANT
LIGASURE IMPACT 36 18CM CVD LR (INSTRUMENTS) ×1 IMPLANT
LIQUID BAND (GAUZE/BANDAGES/DRESSINGS) ×1 IMPLANT
NS IRRIG 1000ML POUR BTL (IV SOLUTION) ×4 IMPLANT
PACK COLON (CUSTOM PROCEDURE TRAY) ×2 IMPLANT
PAD POSITIONING PINK XL (MISCELLANEOUS) ×1 IMPLANT
POUCH SPECIMEN RETRIEVAL 10MM (ENDOMECHANICALS) ×1 IMPLANT
RELOAD 45 VASCULAR/THIN (ENDOMECHANICALS) ×2 IMPLANT
RELOAD STAPLE 45 2.5 WHT GRN (ENDOMECHANICALS) IMPLANT
RETRACTOR WND ALEXIS 18 MED (MISCELLANEOUS) IMPLANT
RTRCTR WOUND ALEXIS 18CM MED (MISCELLANEOUS)
SCISSORS LAP 5X45 EPIX DISP (ENDOMECHANICALS) ×1 IMPLANT
SCRUB PCMX 4 OZ (MISCELLANEOUS) ×2 IMPLANT
SEALER TISSUE G2 CVD JAW 35 (ENDOMECHANICALS) IMPLANT
SEALER TISSUE G2 CVD JAW 45CM (ENDOMECHANICALS)
SET IRRIG TUBING LAPAROSCOPIC (IRRIGATION / IRRIGATOR) ×1 IMPLANT
SHEARS CURVED HARMONIC AC 45CM (MISCELLANEOUS) ×1 IMPLANT
SHEARS HARMONIC ACE PLUS 36CM (ENDOMECHANICALS) ×1 IMPLANT
SHEET LAVH (DRAPES) IMPLANT
SLEEVE XCEL OPT CAN 5 100 (ENDOMECHANICALS) ×3 IMPLANT
SLEEVE Z-THREAD 5X100MM (TROCAR) IMPLANT
SOLUTION ANTI FOG 6CC (MISCELLANEOUS) ×1 IMPLANT
SPONGE LAP 18X18 X RAY DECT (DISPOSABLE) ×1 IMPLANT
STAPLER CUT CVD 40MM BLUE (STAPLE) ×1 IMPLANT
STAPLER CUT RELOAD BLUE (STAPLE) ×1 IMPLANT
STAPLER VISISTAT 35W (STAPLE) ×2 IMPLANT
STRIP CLOSURE SKIN 1/2X4 (GAUZE/BANDAGES/DRESSINGS) IMPLANT
SUT NOVA 1 T20/GS 25DT (SUTURE) ×2 IMPLANT
SUT PDS AB 1 CT1 27 (SUTURE) ×2 IMPLANT
SUT PDS AB 1 CTX 36 (SUTURE) IMPLANT
SUT PDS AB 4-0 SH 27 (SUTURE) ×2 IMPLANT
SUT PROLENE 2 0 KS (SUTURE) IMPLANT
SUT SILK 2 0 (SUTURE) ×2
SUT SILK 2 0 SH CR/8 (SUTURE) ×2 IMPLANT
SUT SILK 2-0 18XBRD TIE 12 (SUTURE) ×1 IMPLANT
SUT SILK 3 0 (SUTURE) ×2
SUT SILK 3 0 SH CR/8 (SUTURE) ×4 IMPLANT
SUT SILK 3-0 18XBRD TIE 12 (SUTURE) ×1 IMPLANT
SUT VIC AB 2-0 CT1 27 (SUTURE)
SUT VIC AB 2-0 CT1 27XBRD (SUTURE) IMPLANT
SUT VIC AB 3-0 PS2 18 (SUTURE)
SUT VIC AB 3-0 PS2 18XBRD (SUTURE) IMPLANT
SUT VIC AB 4-0 SH 18 (SUTURE) ×1 IMPLANT
SUT VICRYL 0 UR6 27IN ABS (SUTURE) IMPLANT
SYR 30ML LL (SYRINGE) ×2 IMPLANT
SYS LAPSCP GELPORT 120MM (MISCELLANEOUS) ×2
SYSTEM LAPSCP GELPORT 120MM (MISCELLANEOUS) IMPLANT
TRAY FOLEY CATH 14FRSI W/METER (CATHETERS) ×1 IMPLANT
TRAY FOLEY CATH 16FR SILVER (SET/KITS/TRAYS/PACK) ×1 IMPLANT
TROCAR BLADELESS OPT 5 100 (ENDOMECHANICALS) ×2 IMPLANT
TROCAR BLADELESS OPT 5 75 (ENDOMECHANICALS) IMPLANT
TROCAR XCEL NON-BLD 11X100MML (ENDOMECHANICALS) IMPLANT
TROCAR XCEL UNIV SLVE 11M 100M (ENDOMECHANICALS) ×1 IMPLANT
TUBING CONNECTING 10 (TUBING) IMPLANT
TUBING FILTER THERMOFLATOR (ELECTROSURGICAL) ×2 IMPLANT
YANKAUER SUCT BULB TIP NO VENT (SUCTIONS) ×1 IMPLANT

## 2014-08-03 NOTE — Transfer of Care (Signed)
Immediate Anesthesia Transfer of Care Note  Patient: Doran Heater  Procedure(s) Performed: Procedure(s): LAPAROSCOPIC TAKEDOWN INTRA-ABDOMINAL ABSCESS, SIGMOID COLECTOMY WITH PRIMARY ANASTAMOSIS, APPENDECTOMY, MOBILIZATION OF SPLENIC FLEXURE (N/A)  Patient Location: PACU  Anesthesia Type:General  Level of Consciousness: awake, alert  and oriented  Airway & Oxygen Therapy: Patient Spontanous Breathing and Patient connected to face mask oxygen  Post-op Assessment: Report given to RN and Post -op Vital signs reviewed and stable  Post vital signs: Reviewed and stable  Last Vitals:  Filed Vitals:   08/03/14 0549  BP: 152/92  Pulse: 86  Temp: 36.6 C  Resp: 18    Complications: No apparent anesthesia complications

## 2014-08-03 NOTE — Brief Op Note (Signed)
08/03/2014  12:51 PM  PATIENT:  Nathaniel Chambers  49 y.o. male  PRE-OPERATIVE DIAGNOSIS:  RECURRENT DIVERTICULITIS  POST-OPERATIVE DIAGNOSIS:  RECURRENT DIVERTICULITIS  PROCEDURE:  Procedure(s): LAPAROSCOPIC TAKEDOWN INTRA-ABDOMINAL ABSCESS, SIGMOID COLECTOMY WITH PRIMARY ANASTAMOSIS, APPENDECTOMY, MOBILIZATION OF SPLENIC FLEXURE (N/A)  SURGEON:  Surgeon(s) and Role:    * Johnathan Hausen, MD - Primary    * Jackolyn Confer, MD - Assisting  PHYSICIAN ASSISTANT:   ASSISTANTS: Jackolyn Confer, MD, FACS   ANESTHESIA:   general  EBL:  Total I/O In: 2550 [I.V.:2500; IV Piggyback:50] Out: 600 [Urine:500; Blood:100]  BLOOD ADMINISTERED:none  DRAINS: none   LOCAL MEDICATIONS USED:  MARCAINE     SPECIMEN:  Source of Specimen:  sigmoid colon, appendix  DISPOSITION OF SPECIMEN:  PATHOLOGY  COUNTS:  YES  TOURNIQUET:  * No tourniquets in log *  DICTATION: .Other Dictation: Dictation Number [id number]  PLAN OF CARE: Admit to inpatient   PATIENT DISPOSITION:  PACU - hemodynamically stable.   Delay start of Pharmacological VTE agent (>24hrs) due to surgical blood loss or risk of bleeding: no

## 2014-08-03 NOTE — Progress Notes (Signed)
Telephone order from MD Hassell Done that foley may be discontinued and order bariatric bed, oorders entered Neta Mends RN 4:05 PM 08-02-2013

## 2014-08-03 NOTE — Anesthesia Postprocedure Evaluation (Signed)
  Anesthesia Post-op Note  Patient: Nathaniel Chambers  Procedure(s) Performed: Procedure(s) (LRB): LAPAROSCOPIC TAKEDOWN INTRA-ABDOMINAL ABSCESS, SIGMOID COLECTOMY WITH PRIMARY ANASTAMOSIS, APPENDECTOMY, MOBILIZATION OF SPLENIC FLEXURE (N/A)  Patient Location: PACU  Anesthesia Type: General  Level of Consciousness: awake and alert   Airway and Oxygen Therapy: Patient Spontanous Breathing  Post-op Pain: mild  Post-op Assessment: Post-op Vital signs reviewed, Patient's Cardiovascular Status Stable, Respiratory Function Stable, Patent Airway and No signs of Nausea or vomiting  Last Vitals:  Filed Vitals:   08/03/14 1409  BP: 183/90  Pulse: 79  Temp: 36.3 C  Resp: 12    Post-op Vital Signs: stable   Complications: No apparent anesthesia complications

## 2014-08-03 NOTE — Anesthesia Preprocedure Evaluation (Addendum)
Anesthesia Evaluation  Patient identified by MRN, date of birth, ID band Patient awake    Reviewed: Allergy & Precautions, H&P , NPO status , Patient's Chart, lab work & pertinent test results  Airway Mallampati: II  TM Distance: >3 FB Neck ROM: full    Dental  (+) Caps, Dental Advisory Given Cap right upper front:   Pulmonary asthma , former smoker,  breath sounds clear to auscultation  Pulmonary exam normal       Cardiovascular Exercise Tolerance: Good negative cardio ROS  Rhythm:regular Rate:Normal     Neuro/Psych Meralgia paresthestica negative neurological ROS  negative psych ROS   GI/Hepatic negative GI ROS, Neg liver ROS, Fatty liver   Endo/Other  negative endocrine ROSMorbid obesity  Renal/GU negative Renal ROS  negative genitourinary   Musculoskeletal   Abdominal (+) + obese,   Peds  Hematology negative hematology ROS (+)   Anesthesia Other Findings   Reproductive/Obstetrics negative OB ROS                          Anesthesia Physical Anesthesia Plan  ASA: III  Anesthesia Plan: General   Post-op Pain Management:    Induction: Intravenous  Airway Management Planned: Oral ETT  Additional Equipment:   Intra-op Plan:   Post-operative Plan: Extubation in OR  Informed Consent: I have reviewed the patients History and Physical, chart, labs and discussed the procedure including the risks, benefits and alternatives for the proposed anesthesia with the patient or authorized representative who has indicated his/her understanding and acceptance.   Dental Advisory Given  Plan Discussed with: CRNA and Surgeon  Anesthesia Plan Comments:        Anesthesia Quick Evaluation

## 2014-08-03 NOTE — Interval H&P Note (Signed)
History and Physical Interval Note:  08/03/2014 7:19 AM  Nathaniel Chambers  has presented today for surgery, with the diagnosis of RECURRENT DIVERTICULITIS  The various methods of treatment have been discussed with the patient and family. After consideration of risks, benefits and other options for treatment, the patient has consented to  Procedure(s): LAPAROSCOPIC ASSISTED SIGMOID COLECTOMY (N/A) as a surgical intervention .  The patient's history has been reviewed, patient examined, no change in status, stable for surgery.  I have reviewed the patient's chart and labs.  Questions were answered to the patient's satisfaction.     Destin Kittler B

## 2014-08-03 NOTE — Anesthesia Procedure Notes (Signed)
Procedure Name: Intubation Date/Time: 08/03/2014 7:36 AM Performed by: Glory Buff Pre-anesthesia Checklist: Patient identified, Emergency Drugs available, Suction available and Patient being monitored Patient Re-evaluated:Patient Re-evaluated prior to inductionOxygen Delivery Method: Circle System Utilized Preoxygenation: Pre-oxygenation with 100% oxygen Intubation Type: IV induction Ventilation: Mask ventilation without difficulty Laryngoscope Size: Mac and 4 Grade View: Grade I Tube type: Oral Tube size: 8.0 mm Number of attempts: 1 Airway Equipment and Method: Stylet and Oral airway Placement Confirmation: ETT inserted through vocal cords under direct vision,  positive ETCO2 and breath sounds checked- equal and bilateral Secured at: 22 cm Tube secured with: Tape Dental Injury: Teeth and Oropharynx as per pre-operative assessment

## 2014-08-04 LAB — BASIC METABOLIC PANEL
Anion gap: 8 (ref 5–15)
BUN: 9 mg/dL (ref 6–23)
CO2: 25 mmol/L (ref 19–32)
CREATININE: 1.11 mg/dL (ref 0.50–1.35)
Calcium: 9.2 mg/dL (ref 8.4–10.5)
Chloride: 102 mmol/L (ref 96–112)
GFR calc non Af Amer: 76 mL/min — ABNORMAL LOW (ref >90)
GFR, EST AFRICAN AMERICAN: 88 mL/min — AB (ref >90)
GLUCOSE: 138 mg/dL — AB (ref 70–99)
Potassium: 3.8 mmol/L (ref 3.5–5.1)
Sodium: 135 mmol/L (ref 135–145)

## 2014-08-04 LAB — CBC
HCT: 43.2 % (ref 39.0–52.0)
Hemoglobin: 14 g/dL (ref 13.0–17.0)
MCH: 28.1 pg (ref 26.0–34.0)
MCHC: 32.4 g/dL (ref 30.0–36.0)
MCV: 86.7 fL (ref 78.0–100.0)
Platelets: 360 10*3/uL (ref 150–400)
RBC: 4.98 MIL/uL (ref 4.22–5.81)
RDW: 14.1 % (ref 11.5–15.5)
WBC: 15.8 10*3/uL — AB (ref 4.0–10.5)

## 2014-08-04 MED ORDER — ALVIMOPAN 12 MG PO CAPS
12.0000 mg | ORAL_CAPSULE | Freq: Two times a day (BID) | ORAL | Status: DC
  Administered 2014-08-04 – 2014-08-08 (×9): 12 mg via ORAL
  Filled 2014-08-04 (×10): qty 1

## 2014-08-04 MED ORDER — PROMETHAZINE HCL 25 MG/ML IJ SOLN
12.5000 mg | INTRAMUSCULAR | Status: DC | PRN
  Administered 2014-08-04 – 2014-08-09 (×18): 12.5 mg via INTRAVENOUS
  Filled 2014-08-04 (×17): qty 1

## 2014-08-04 NOTE — Progress Notes (Signed)
Patient ID: Nathaniel Chambers, male   DOB: [birth year], 49 y.o.   MRN: [id number]  [facility]  POD#: 1  Subjective: Patient up in room, family at bedside.  No nausea, mild pain.  No flatus.  Objective: Vital signs in last 24 hours: Temp:  [97.2 F (36.2 C)-99.2 F (37.3 C)] 98 F (36.7 C) (04/09 0500) Pulse Rate:  [70-99] 84 (04/09 0500) Resp:  [12-18] 18 (04/09 0500) BP: (145-183)/(80-103) 155/89 mmHg (04/09 0500) SpO2:  [94 %-100 %] 98 % (04/09 0500) Last BM Date: 08/02/14  Intake/Output from previous day: 04/08 0701 - 04/09 0700 In: 4640 [I.V.:4540; IV Piggyback:100] Out: 850 [Urine:750; Blood:100] Intake/Output this shift:    Physical Exam: HEENT - sclerae clear, mucous membranes moist Neck - soft Chest - clear bilaterally Cor - RRR Abdomen - soft, obese; quiet, no BS present; dressing with small serosanguinous Ext - no edema, non-tender Neuro - alert & oriented, no focal deficits  Lab Results:   Recent Labs  08/03/14 1525 08/04/14 0537  WBC 19.7* 15.8*  HGB 15.0 14.0  HCT 45.8 43.2  PLT 309 360   BMET  Recent Labs  08/03/14 1525 08/04/14 0537  NA  --  135  K  --  3.8  CL  --  102  CO2  --  25  GLUCOSE  --  138*  BUN  --  9  CREATININE 1.27 1.11  CALCIUM  --  9.2   PT/INR No results for input(s): LABPROT, INR in the last 72 hours. Comprehensive Metabolic Panel:    Component Value Date/Time   NA 135 08/04/2014 0537   NA 138 07/30/2014 1205   K 3.8 08/04/2014 0537   K 4.4 07/30/2014 1205   CL 102 08/04/2014 0537   CL 105 07/30/2014 1205   CO2 25 08/04/2014 0537   CO2 24 07/30/2014 1205   BUN 9 08/04/2014 0537   BUN 10 07/30/2014 1205   CREATININE 1.11 08/04/2014 0537   CREATININE 1.27 08/03/2014 1525   GLUCOSE 138* 08/04/2014 0537   GLUCOSE 95 07/30/2014 1205   CALCIUM 9.2 08/04/2014 0537   CALCIUM 9.5 07/30/2014 1205   AST 38* 06/16/2014 0500   AST 20 06/11/2014 0519   ALT 38 06/16/2014 0500   ALT  25 06/11/2014 0519   ALKPHOS 50 06/16/2014 0500   ALKPHOS 71 06/11/2014 0519   BILITOT 0.4 06/16/2014 0500   BILITOT 0.6 06/11/2014 0519   PROT 6.6 06/16/2014 0500   PROT 7.9 06/11/2014 0519   ALBUMIN 3.5 06/16/2014 0500   ALBUMIN 4.3 06/11/2014 0519    Studies/Results: No results found.  Anti-infectives: Anti-infectives    Start     Dose/Rate Route Frequency Ordered Stop   08/03/14 1930  cefoTEtan (CEFOTAN) 2 g in dextrose 5 % 50 mL IVPB     2 g 100 mL/hr over 30 Minutes Intravenous Every 12 hours 08/03/14 1419 08/03/14 2056   08/03/14 0630  cefoTEtan (CEFOTAN) 2 g in dextrose 5 % 50 mL IVPB  Status:  Discontinued     2 g 100 mL/hr over 30 Minutes Intravenous On call to O.R. 08/03/14 0630 08/03/14 1403   08/03/14 4580  cefoTEtan (CEFOTAN) 2 g in dextrose 5 % 50 mL IVPB  Status:  Discontinued     2 g 100 mL/hr over 30 Minutes Intravenous On call to O.R. 08/03/14 9983 08/03/14 0738   08/03/14 0607  neomycin (MYCIFRADIN) tablet 1,000 mg  Status:  Discontinued  1,000 mg Oral 3 times per day 08/03/14 0607 08/03/14 0630   08/03/14 0607  erythromycin (E-MYCIN) tablet 1,000 mg  Status:  Discontinued     1,000 mg Oral 3 times per day 08/03/14 7493 08/03/14 0630      Assessment & Plans: Status post sigmoid colectomy, lysis of adhesions  Ice chips, sips of water only  Ambulate in halls  Phenergan for pain, nausea  Await resolution of ileus  Earnstine Regal, MD, [facility] Office: [place] 08/04/2014

## 2014-08-04 NOTE — Op Note (Signed)
Nathaniel Chambers, Nathaniel Chambers                 ACCOUNT NO.:  [id number]  MEDICAL RECORD NO.:  [id number]  LOCATION:  1534                         FACILITY:  [facility]  PHYSICIAN:  Isabel Caprice. Hassell Done, MD  DATE OF BIRTH:  1965/05/26  DATE OF PROCEDURE:  08/03/2014 DATE OF DISCHARGE:                              OPERATIVE REPORT   PREOPERATIVE DIAGNOSIS:  Recurrent diverticulitis.  POSTOPERATIVE DIAGNOSES:  Intraabdominal abscess involving the sigmoid colon, perforated diverticulum with base of the small bowel mesentery and involving small bowel.  PROCEDURES:  Laparoscopic takedown of intraabdominal abscess, laparoscopic mobilization of the splenic flexure of the colon, lap- assisted sigmoid colectomy with primary anastomosis, hand-sewn 2-layer side end, and appendectomy.  SURGEON:  Isabel Caprice. Hassell Done, MD  ASSISTANT:  Odis Hollingshead, M.D.  ANESTHESIA:  General endotracheal.  DESCRIPTION OF PROCEDURE:  The patient was taken to room 11 and placed in Yellofin's stirrups, and was prepped with an alcohol-based chlorhexidine as per the colon protocol.  I entered the abdomen through the left upper quadrant using a 5-mm 0-degree OptiView without difficulty.  Two 5 mm were placed in lower midline and one was placed in the left lower quadrant.  Through these, I first began mobilizing the splenic flexure, which I did with a Harmonic Scalpel.  I then worked my way down into the pelvis and follow the sigmoid colon into a mass that involved convoluted loops of the small intestine that were all stuck down on this.  I then took my two lower midline ports and converted those to a hand assist and inserted my hand and began trying to make some sense of the loops of bowel that were all stuck to this colon.  I made some progress and then went ahead and took the hand assist device out and then just meticulously used Metzenbaum scissors to take down multiple interloop adhesions and ultimately breaking up an  abscess in the root of the mesentery that was about the size of a cherry.  I cauterized this exuberant midi wall within the mesentery.  It was somewhat contiguous with the perforation in the sigmoid colon, which appeared to be a single tick that had perforated.  The remaining portion of the small bowel was free.  There was one area where the mesentery was a little torn and I repaired that with a figure-of-eight suture of 4-0 silk.  I then performed a sigmoid resection going distally and using the contour with a blue load to divide the colon there.  The LigaSure was used to transect the mesentery and then proximally I divided it again with a contour.  I brought the tenia in that surface of the bowel down to meet the remnant of the distal colon and without any tension, I was able to perform a side-to-end Joel-Baker type anastomosis and sewing a back row of 3-0 silks.  I opened the bowel with a Harmonic Scalpel.  An inner layer running locking 4-0 PDS was used posteriorly and then carried anteriorly in a canal Mayo fashion.  Second anterior row of 3-0 silks were used to complete the anastomosis.  I then went in and looked at all this laparoscopically and because the  appendix was now aligned, really close to this anastomosis, I elected to remove it.  I mobilized it and actually went and did this open through the gel port and the staple across the base with Endo-GIA vascular load.  I then removed the apparatus and we, as per the colorectal protocol, used separate equipment and new gowns and gloves and I then closed the peritoneum with a running 0 PDS.  The fascia was then closed with interrupted #1 Novafil.  The wound was irrigated and all were closed with 4-0 Vicryl and then with staples.  The patient tolerated the procedure well and was taken to recovery room in satisfactory condition.     Isabel Caprice Hassell Done, MD     MBM/MEDQ  D:  08/03/2014  T:  08/04/2014  Job:  [id number]

## 2014-08-05 NOTE — Progress Notes (Signed)
Patient ID: Nathaniel Chambers, male   DOB: [birth year], 49 y.o.   MRN: [id number]  [facility]  POD#: 2  Subjective: Patient in bed.  Ambulated.  Passed small flatus, liquid BM.  No nausea.  Objective: Vital signs in last 24 hours: Temp:  [97.7 F (36.5 C)-99.3 F (37.4 C)] 97.7 F (36.5 C) (04/10 0524) Pulse Rate:  [77-89] 77 (04/10 0524) Resp:  [16-18] 16 (04/10 0524) BP: (141-169)/(77-95) 141/77 mmHg (04/10 0524) SpO2:  [94 %-97 %] 94 % (04/10 0524) Last BM Date: 08/02/14  Intake/Output from previous day: 04/09 0701 - 04/10 0700 In: 2400 [I.V.:2400] Out: 1575 [Urine:1575] Intake/Output this shift:    Physical Exam: HEENT - sclerae clear, mucous membranes moist Neck - soft Chest - clear bilaterally Cor - RRR Abdomen - soft, obese; few BS present; dressing intact Ext - no edema, non-tender Neuro - alert & oriented, no focal deficits  Lab Results:   Recent Labs  08/03/14 1525 08/04/14 0537  WBC 19.7* 15.8*  HGB 15.0 14.0  HCT 45.8 43.2  PLT 309 360   BMET  Recent Labs  08/03/14 1525 08/04/14 0537  NA  --  135  K  --  3.8  CL  --  102  CO2  --  25  GLUCOSE  --  138*  BUN  --  9  CREATININE 1.27 1.11  CALCIUM  --  9.2   PT/INR No results for input(s): LABPROT, INR in the last 72 hours. Comprehensive Metabolic Panel:    Component Value Date/Time   NA 135 08/04/2014 0537   NA 138 07/30/2014 1205   K 3.8 08/04/2014 0537   K 4.4 07/30/2014 1205   CL 102 08/04/2014 0537   CL 105 07/30/2014 1205   CO2 25 08/04/2014 0537   CO2 24 07/30/2014 1205   BUN 9 08/04/2014 0537   BUN 10 07/30/2014 1205   CREATININE 1.11 08/04/2014 0537   CREATININE 1.27 08/03/2014 1525   GLUCOSE 138* 08/04/2014 0537   GLUCOSE 95 07/30/2014 1205   CALCIUM 9.2 08/04/2014 0537   CALCIUM 9.5 07/30/2014 1205   AST 38* 06/16/2014 0500   AST 20 06/11/2014 0519   ALT 38 06/16/2014 0500   ALT 25 06/11/2014 0519   ALKPHOS 50 06/16/2014 0500   ALKPHOS 71 06/11/2014 0519   BILITOT 0.4 06/16/2014 0500   BILITOT 0.6 06/11/2014 0519   PROT 6.6 06/16/2014 0500   PROT 7.9 06/11/2014 0519   ALBUMIN 3.5 06/16/2014 0500   ALBUMIN 4.3 06/11/2014 0519    Studies/Results: No results found.  Anti-infectives: Anti-infectives    Start     Dose/Rate Route Frequency Ordered Stop   08/03/14 1930  cefoTEtan (CEFOTAN) 2 g in dextrose 5 % 50 mL IVPB     2 g 100 mL/hr over 30 Minutes Intravenous Every 12 hours 08/03/14 1419 08/03/14 2056   08/03/14 0630  cefoTEtan (CEFOTAN) 2 g in dextrose 5 % 50 mL IVPB  Status:  Discontinued     2 g 100 mL/hr over 30 Minutes Intravenous On call to O.R. 08/03/14 0630 08/03/14 1403   08/03/14 2542  cefoTEtan (CEFOTAN) 2 g in dextrose 5 % 50 mL IVPB  Status:  Discontinued     2 g 100 mL/hr over 30 Minutes Intravenous On call to O.R. 08/03/14 7062 08/03/14 0738   08/03/14 0607  neomycin (MYCIFRADIN) tablet 1,000 mg  Status:  Discontinued     1,000 mg Oral 3 times per day  08/03/14 0607 08/03/14 0630   08/03/14 0607  erythromycin (E-MYCIN) tablet 1,000 mg  Status:  Discontinued     1,000 mg Oral 3 times per day 08/03/14 1314 08/03/14 0630      Assessment & Plans: Status post sigmoid colectomy, lysis of adhesions Begin clear liquid diet Ambulate in halls  Decrease IV rate Await resolution of ileus  Earnstine Regal, MD, [facility] Office: [place] 08/05/2014

## 2014-08-06 ENCOUNTER — Encounter (HOSPITAL_COMMUNITY): Payer: Self-pay | Admitting: Surgery

## 2014-08-06 NOTE — Progress Notes (Signed)
Patient ID: CLEMONS SALVUCCI, male   DOB: August 03, 1965, 49 y.o.   MRN: [id number] 3 Days Post-Op  Subjective: Mild pain unchanged.  Tol CL without nausea.  Has had more flatus and loose BMs  Objective: Vital signs in last 24 hours: Temp:  [97.6 F (36.4 C)-98.4 F (36.9 C)] 97.6 F (36.4 C) (04/11 0636) Pulse Rate:  [67-85] 67 (04/11 0636) Resp:  [18] 18 (04/11 0636) BP: (137-146)/(81-84) 144/81 mmHg (04/11 0636) SpO2:  [94 %-99 %] 95 % (04/11 0812) Last BM Date: 08/02/14  Intake/Output from previous day: 04/10 0701 - 04/11 0700 In: 1875.4 [I.V.:1875.4] Out: 1350 [Urine:1350] Intake/Output this shift:    General appearance: alert, cooperative and no distress GI: Minimal appropriate incisional tenderness Incision/Wound: Some bloody drainage under bandage  Lab Results:   Recent Labs  08/03/14 1525 08/04/14 0537  WBC 19.7* 15.8*  HGB 15.0 14.0  HCT 45.8 43.2  PLT 309 360   BMET  Recent Labs  08/03/14 1525 08/04/14 0537  NA  --  135  K  --  3.8  CL  --  102  CO2  --  25  GLUCOSE  --  138*  BUN  --  9  CREATININE 1.27 1.11  CALCIUM  --  9.2     Studies/Results: No results found.  Anti-infectives: Anti-infectives    Start     Dose/Rate Route Frequency Ordered Stop   08/03/14 1930  cefoTEtan (CEFOTAN) 2 g in dextrose 5 % 50 mL IVPB     2 g 100 mL/hr over 30 Minutes Intravenous Every 12 hours 08/03/14 1419 08/03/14 2056   08/03/14 0630  cefoTEtan (CEFOTAN) 2 g in dextrose 5 % 50 mL IVPB  Status:  Discontinued     2 g 100 mL/hr over 30 Minutes Intravenous On call to O.R. 08/03/14 0630 08/03/14 1403   08/03/14 4128  cefoTEtan (CEFOTAN) 2 g in dextrose 5 % 50 mL IVPB  Status:  Discontinued     2 g 100 mL/hr over 30 Minutes Intravenous On call to O.R. 08/03/14 7867 08/03/14 0738   08/03/14 0607  neomycin (MYCIFRADIN) tablet 1,000 mg  Status:  Discontinued     1,000 mg Oral 3 times per day 08/03/14 0607 08/03/14 0630   08/03/14 0607  erythromycin (E-MYCIN) tablet  1,000 mg  Status:  Discontinued     1,000 mg Oral 3 times per day 08/03/14 0607 08/03/14 0630      Assessment/Plan: s/p Procedure(s): LAPAROSCOPIC TAKEDOWN INTRA-ABDOMINAL ABSCESS, SIGMOID COLECTOMY WITH PRIMARY ANASTAMOSIS, APPENDECTOMY, MOBILIZATION OF SPLENIC FLEXURE    LOS: 3 days    Joelie Schou T 08/06/2014

## 2014-08-06 NOTE — Progress Notes (Signed)
Self administer home meds

## 2014-08-07 LAB — CBC
HEMATOCRIT: 41.6 % (ref 39.0–52.0)
Hemoglobin: 13.4 g/dL (ref 13.0–17.0)
MCH: 28.2 pg (ref 26.0–34.0)
MCHC: 32.2 g/dL (ref 30.0–36.0)
MCV: 87.6 fL (ref 78.0–100.0)
PLATELETS: 300 10*3/uL (ref 150–400)
RBC: 4.75 MIL/uL (ref 4.22–5.81)
RDW: 13.9 % (ref 11.5–15.5)
WBC: 9.9 10*3/uL (ref 4.0–10.5)

## 2014-08-07 NOTE — Progress Notes (Signed)
Patient ID: Nathaniel Chambers, male   DOB: February 23, 1966, 49 y.o.   MRN: [id number] 4 Days Post-Op  Subjective: No complaints this morning. Asking for a soft diet. Had small bowel movement yesterday.  Objective: Vital signs in last 24 hours: Temp:  [98 F (36.7 C)-98.6 F (37 C)] 98 F (36.7 C) (04/12 0630) Pulse Rate:  [70-82] 70 (04/12 0630) Resp:  [18] 18 (04/12 0630) BP: (128-136)/(82-83) 128/83 mmHg (04/12 0630) SpO2:  [94 %-98 %] 98 % (04/12 0630) Last BM Date: 08/06/14  Intake/Output from previous day: 04/11 0701 - 04/12 0700 In: 1717.1 [P.O.:360; I.V.:1357.1] Out: 400 [Urine:400] Intake/Output this shift:    General appearance: alert, cooperative and no distress GI: normal findings: soft, non-tender Incision/Wound: clean and dry without evidence of infection  Lab Results:   Recent Labs  08/07/14 0550  WBC 9.9  HGB 13.4  HCT 41.6  PLT 300   BMET No results for input(s): NA, K, CL, CO2, GLUCOSE, BUN, CREATININE, CALCIUM in the last 72 hours.   Studies/Results: No results found.  Anti-infectives: Anti-infectives    Start     Dose/Rate Route Frequency Ordered Stop   08/03/14 1930  cefoTEtan (CEFOTAN) 2 g in dextrose 5 % 50 mL IVPB     2 g 100 mL/hr over 30 Minutes Intravenous Every 12 hours 08/03/14 1419 08/03/14 2056   08/03/14 0630  cefoTEtan (CEFOTAN) 2 g in dextrose 5 % 50 mL IVPB  Status:  Discontinued     2 g 100 mL/hr over 30 Minutes Intravenous On call to O.R. 08/03/14 0630 08/03/14 1403   08/03/14 3846  cefoTEtan (CEFOTAN) 2 g in dextrose 5 % 50 mL IVPB  Status:  Discontinued     2 g 100 mL/hr over 30 Minutes Intravenous On call to O.R. 08/03/14 6599 08/03/14 0738   08/03/14 0607  neomycin (MYCIFRADIN) tablet 1,000 mg  Status:  Discontinued     1,000 mg Oral 3 times per day 08/03/14 0607 08/03/14 0630   08/03/14 0607  erythromycin (E-MYCIN) tablet 1,000 mg  Status:  Discontinued     1,000 mg Oral 3 times per day 08/03/14 0607 08/03/14 0630       Assessment/Plan: s/p Procedure(s): LAPAROSCOPIC TAKEDOWN INTRA-ABDOMINAL ABSCESS, SIGMOID COLECTOMY WITH PRIMARY ANASTAMOSIS, APPENDECTOMY, MOBILIZATION OF SPLENIC FLEXURE Doing very well. Advance to soft diet. Anticipating discharge tomorrow.   LOS: 4 days    Natalyah Cummiskey T 08/07/2014

## 2014-08-08 NOTE — Progress Notes (Signed)
Pharmacy Brief Note - Alvimopan (Entereg)  The standing order set for alvimopan (Entereg) now includes an automatic order to discontinue the drug after the patient has had a bowel movement. The change was approved by the [facility] and the Medical Executive Committee.   This patient has had bowel movements documented by nursing. Therefore, alvimopan has been discontinued. If there are questions, please contact the pharmacy at [phone].   Thank you-  Dolly Rias RPh 08/08/2014, 10:47 AM Pager [phone]

## 2014-08-08 NOTE — Progress Notes (Signed)
Patient ID: Nathaniel Chambers, male   DOB: 11/10/1965, 49 y.o.   MRN: [id number] [facility] Progress Note:   5 Days Post-Op  Subjective: Mental status is clear.  Sore with exertion Objective: Vital signs in last 24 hours: Temp:  [98 F (36.7 C)-99 F (37.2 C)] 98.7 F (37.1 C) (04/13 0600) Pulse Rate:  [72-83] 72 (04/13 0600) Resp:  [18] 18 (04/13 0600) BP: (132-146)/(76-84) 132/76 mmHg (04/13 0600) SpO2:  [93 %-99 %] 98 % (04/13 0600)  Intake/Output from previous day: 04/12 0701 - 04/13 0700 In: 2760 [P.O.:1560; I.V.:1200] Out: 350 [Urine:350] Intake/Output this shift:    Physical Exam: Work of breathing is normal.  Incisions are OK  Lab Results:  Results for orders placed or performed during the hospital encounter of 08/03/14 (from the past 48 hour(s))  CBC     Status: None   Collection Time: 08/07/14  5:50 AM  Result Value Ref Range   WBC 9.9 4.0 - 10.5 K/uL   RBC 4.75 4.22 - 5.81 MIL/uL   Hemoglobin 13.4 13.0 - 17.0 g/dL   HCT 41.6 39.0 - 52.0 %   MCV 87.6 78.0 - 100.0 fL   MCH 28.2 26.0 - 34.0 pg   MCHC 32.2 30.0 - 36.0 g/dL   RDW 13.9 11.5 - 15.5 %   Platelets 300 150 - 400 K/uL    Radiology/Results: No results found.  Anti-infectives: Anti-infectives    Start     Dose/Rate Route Frequency Ordered Stop   08/03/14 1930  cefoTEtan (CEFOTAN) 2 g in dextrose 5 % 50 mL IVPB     2 g 100 mL/hr over 30 Minutes Intravenous Every 12 hours 08/03/14 1419 08/03/14 2056   08/03/14 0630  cefoTEtan (CEFOTAN) 2 g in dextrose 5 % 50 mL IVPB  Status:  Discontinued     2 g 100 mL/hr over 30 Minutes Intravenous On call to O.R. 08/03/14 0630 08/03/14 1403   08/03/14 2023  cefoTEtan (CEFOTAN) 2 g in dextrose 5 % 50 mL IVPB  Status:  Discontinued     2 g 100 mL/hr over 30 Minutes Intravenous On call to O.R. 08/03/14 3435 08/03/14 0738   08/03/14 0607  neomycin (MYCIFRADIN) tablet 1,000 mg  Status:  Discontinued     1,000 mg Oral 3 times per day 08/03/14 0607 08/03/14  0630   08/03/14 0607  erythromycin (E-MYCIN) tablet 1,000 mg  Status:  Discontinued     1,000 mg Oral 3 times per day 08/03/14 6861 08/03/14 0630      Assessment/Plan: Problem List: Patient Active Problem List   Diagnosis Date Noted  . S/P partial colectomy 08/03/2014  . Meralgia paraesthetica 07/12/2014  . Diverticulitis of large intestine without perforation or abscess without bleeding   . Enteritis due to Clostridium difficile   . Obesity 04/10/2014  . Colonic diverticular abscess 04/04/2014  . Sigmoid diverticulitis 04/04/2014  . Elevated lipase 12/19/2013  . Diverticulitis 12/15/2013  . Asthma 12/15/2013  . Fatty liver 12/15/2013  . 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   5 Days Post-Op    LOS: 5 days   Matt B. Hassell Done, MD, [facility] [phone] beeper [phone]  08/08/2014 11:04 AM

## 2014-08-09 ENCOUNTER — Inpatient Hospital Stay (HOSPITAL_COMMUNITY): Payer: BLUE CROSS/BLUE SHIELD

## 2014-08-09 ENCOUNTER — Encounter (HOSPITAL_COMMUNITY): Payer: Self-pay | Admitting: Radiology

## 2014-08-09 LAB — CBC WITH DIFFERENTIAL/PLATELET
Basophils Absolute: 0.1 10*3/uL (ref 0.0–0.1)
Basophils Relative: 0 % (ref 0–1)
Eosinophils Absolute: 0.4 10*3/uL (ref 0.0–0.7)
Eosinophils Relative: 3 % (ref 0–5)
HCT: 41.7 % (ref 39.0–52.0)
HEMOGLOBIN: 13.4 g/dL (ref 13.0–17.0)
Lymphocytes Relative: 32 % (ref 12–46)
Lymphs Abs: 3.7 10*3/uL (ref 0.7–4.0)
MCH: 28.2 pg (ref 26.0–34.0)
MCHC: 32.1 g/dL (ref 30.0–36.0)
MCV: 87.8 fL (ref 78.0–100.0)
MONOS PCT: 7 % (ref 3–12)
Monocytes Absolute: 0.8 10*3/uL (ref 0.1–1.0)
Neutro Abs: 6.6 10*3/uL (ref 1.7–7.7)
Neutrophils Relative %: 58 % (ref 43–77)
PLATELETS: 297 10*3/uL (ref 150–400)
RBC: 4.75 MIL/uL (ref 4.22–5.81)
RDW: 13.7 % (ref 11.5–15.5)
WBC: 11.7 10*3/uL — ABNORMAL HIGH (ref 4.0–10.5)

## 2014-08-09 MED ORDER — IOHEXOL 300 MG/ML  SOLN
25.0000 mL | INTRAMUSCULAR | Status: AC
  Administered 2014-08-09 (×2): 25 mL via ORAL

## 2014-08-09 MED ORDER — PROMETHAZINE HCL 25 MG/ML IJ SOLN
12.5000 mg | INTRAMUSCULAR | Status: DC | PRN
  Administered 2014-08-02 – 2014-08-09 (×4): 12.5 mg via INTRAVENOUS
  Administered 2014-08-10 – 2014-08-11 (×6): 25 mg via INTRAVENOUS
  Filled 2014-08-09 (×8): qty 1

## 2014-08-09 MED ORDER — IOHEXOL 300 MG/ML  SOLN
100.0000 mL | Freq: Once | INTRAMUSCULAR | Status: AC | PRN
  Administered 2014-08-09: 100 mL via INTRAVENOUS

## 2014-08-09 MED ORDER — MORPHINE SULFATE 2 MG/ML IJ SOLN
2.0000 mg | INTRAMUSCULAR | Status: DC | PRN
  Administered 2014-08-09 – 2014-08-11 (×14): 4 mg via INTRAVENOUS
  Filled 2014-08-09 (×15): qty 2

## 2014-08-09 NOTE — Plan of Care (Signed)
Problem: Phase II Progression Outcomes Goal: Discharge plan established Outcome: Not Met (add Reason) Patient was scheduled for CT of abd with contrast

## 2014-08-09 NOTE — Care Management Note (Signed)
    Page 1 of 1   08/09/2014     3:06:35 PM CARE MANAGEMENT NOTE 08/09/2014  Patient:  Nathaniel Chambers, Nathaniel Chambers   Account Number:  [id number]  Date Initiated:  08/09/2014  Documentation initiated by:  Sunday Spillers  Subjective/Objective Assessment:   49 yo male admitted s/p colectomy. PTA lived at home with spouse.     Action/Plan:   Home when stable   Anticipated DC Date:  08/09/2014   Anticipated DC Plan:  [place]  CM consult      Choice offered to / List presented to:             Status of service:  Completed, signed off Medicare Important Message given?   (If response is "NO", the following Medicare IM given date fields will be blank) Date Medicare IM given:   Medicare IM given by:   Date Additional Medicare IM given:   Additional Medicare IM given by:    Discharge Disposition:  HOME/SELF CARE  Per UR Regulation:  Reviewed for med. necessity/level of care/duration of stay  If discussed at [place] of Stay Meetings, dates discussed:    Comments:

## 2014-08-09 NOTE — Progress Notes (Addendum)
Patient ID: Nathaniel Chambers, male   DOB: [birth year], 49 y.o.   MRN: [id number] [facility] Surgery Progress Note:   6 Days Post-Op  Subjective: Mental status is clear. Complaining of new pain in lower abdomen-pelvic Objective: Vital signs in last 24 hours: Temp:  [98.4 F (36.9 C)-99 F (37.2 C)] 98.5 F (36.9 C) (04/14 0600) Pulse Rate:  [79-82] 82 (04/14 0600) Resp:  [18] 18 (04/14 0600) BP: (119-140)/(78-81) 140/80 mmHg (04/14 0600) SpO2:  [96 %-99 %] 96 % (04/14 0600)  Intake/Output from previous day: 04/13 0701 - 04/14 0700 In: 2720 [P.O.:1720; I.V.:1000] Out: 375 [Urine:375] Intake/Output this shift:    Physical Exam: Work of breathing is not labored.  More tender bilaterally in the pelvis at the level of his incision;  Incision is OK  Lab Results:  Results for orders placed or performed during the hospital encounter of 08/03/14 (from the past 48 hour(s))  CBC with Differential/Platelet     Status: Abnormal   Collection Time: 08/09/14  4:20 AM  Result Value Ref Range   WBC 11.7 (H) 4.0 - 10.5 K/uL   RBC 4.75 4.22 - 5.81 MIL/uL   Hemoglobin 13.4 13.0 - 17.0 g/dL   HCT 41.7 39.0 - 52.0 %   MCV 87.8 78.0 - 100.0 fL   MCH 28.2 26.0 - 34.0 pg   MCHC 32.1 30.0 - 36.0 g/dL   RDW 13.7 11.5 - 15.5 %   Platelets 297 150 - 400 K/uL   Neutrophils Relative % 58 43 - 77 %   Neutro Abs 6.6 1.7 - 7.7 K/uL   Lymphocytes Relative 32 12 - 46 %   Lymphs Abs 3.7 0.7 - 4.0 K/uL   Monocytes Relative 7 3 - 12 %   Monocytes Absolute 0.8 0.1 - 1.0 K/uL   Eosinophils Relative 3 0 - 5 %   Eosinophils Absolute 0.4 0.0 - 0.7 K/uL   Basophils Relative 0 0 - 1 %   Basophils Absolute 0.1 0.0 - 0.1 K/uL    Radiology/Results: No results found.  Anti-infectives: Anti-infectives    Start     Dose/Rate Route Frequency Ordered Stop   08/03/14 1930  cefoTEtan (CEFOTAN) 2 g in dextrose 5 % 50 mL IVPB     2 g 100 mL/hr over 30 Minutes Intravenous Every 12 hours 08/03/14 1419 08/03/14 2056   08/03/14 0630  cefoTEtan (CEFOTAN) 2 g in dextrose 5 % 50 mL IVPB  Status:  Discontinued     2 g 100 mL/hr over 30 Minutes Intravenous On call to O.R. 08/03/14 0630 08/03/14 1403   08/03/14 7371  cefoTEtan (CEFOTAN) 2 g in dextrose 5 % 50 mL IVPB  Status:  Discontinued     2 g 100 mL/hr over 30 Minutes Intravenous On call to O.R. 08/03/14 0626 08/03/14 0738   08/03/14 0607  neomycin (MYCIFRADIN) tablet 1,000 mg  Status:  Discontinued     1,000 mg Oral 3 times per day 08/03/14 0607 08/03/14 0630   08/03/14 0607  erythromycin (E-MYCIN) tablet 1,000 mg  Status:  Discontinued     1,000 mg Oral 3 times per day 08/03/14 9485 08/03/14 0630      Assessment/Plan: Problem List: Patient Active Problem List   Diagnosis Date Noted  . S/P partial colectomy 08/03/2014  . Meralgia paraesthetica 07/12/2014  . Diverticulitis of large intestine without perforation or abscess without bleeding   . Enteritis due to Clostridium difficile   . Obesity 04/10/2014  . Colonic diverticular abscess 04/04/2014  .  Sigmoid diverticulitis 04/04/2014  . Elevated lipase 12/19/2013  . Diverticulitis 12/15/2013  . Asthma 12/15/2013  . Fatty liver 12/15/2013  . 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 Hassell Done, MD, [facility] [phone] beeper [phone]  08/09/2014 7:22 AM   CT reviewed and no air/fluid collection seen.  Discussed results with patient by phone and he requests more pain med and more phenergan.  Orders entered.  Will check CBC in am.

## 2014-08-10 LAB — CBC WITH DIFFERENTIAL/PLATELET
Basophils Absolute: 0 10*3/uL (ref 0.0–0.1)
Basophils Relative: 0 % (ref 0–1)
Eosinophils Absolute: 0.5 10*3/uL (ref 0.0–0.7)
Eosinophils Relative: 4 % (ref 0–5)
HCT: 40.7 % (ref 39.0–52.0)
Hemoglobin: 13 g/dL (ref 13.0–17.0)
Lymphocytes Relative: 29 % (ref 12–46)
Lymphs Abs: 3.7 10*3/uL (ref 0.7–4.0)
MCH: 28.3 pg (ref 26.0–34.0)
MCHC: 31.9 g/dL (ref 30.0–36.0)
MCV: 88.5 fL (ref 78.0–100.0)
Monocytes Absolute: 0.8 10*3/uL (ref 0.1–1.0)
Monocytes Relative: 6 % (ref 3–12)
NEUTROS PCT: 61 % (ref 43–77)
Neutro Abs: 7.9 10*3/uL — ABNORMAL HIGH (ref 1.7–7.7)
PLATELETS: 321 10*3/uL (ref 150–400)
RBC: 4.6 MIL/uL (ref 4.22–5.81)
RDW: 14.1 % (ref 11.5–15.5)
WBC: 12.9 10*3/uL — AB (ref 4.0–10.5)

## 2014-08-10 LAB — BASIC METABOLIC PANEL
Anion gap: 7 (ref 5–15)
BUN: 9 mg/dL (ref 6–23)
CALCIUM: 8.7 mg/dL (ref 8.4–10.5)
CHLORIDE: 104 mmol/L (ref 96–112)
CO2: 26 mmol/L (ref 19–32)
Creatinine, Ser: 1.12 mg/dL (ref 0.50–1.35)
GFR calc Af Amer: 87 mL/min — ABNORMAL LOW (ref >90)
GFR calc non Af Amer: 75 mL/min — ABNORMAL LOW (ref >90)
Glucose, Bld: 107 mg/dL — ABNORMAL HIGH (ref 70–99)
Potassium: 4 mmol/L (ref 3.5–5.1)
Sodium: 137 mmol/L (ref 135–145)

## 2014-08-10 LAB — CLOSTRIDIUM DIFFICILE BY PCR: Toxigenic C. Difficile by PCR: NEGATIVE

## 2014-08-10 MED ORDER — METRONIDAZOLE 500 MG PO TABS
500.0000 mg | ORAL_TABLET | Freq: Three times a day (TID) | ORAL | Status: DC
  Administered 2014-08-10 – 2014-08-11 (×4): 500 mg via ORAL
  Filled 2014-08-10 (×6): qty 1

## 2014-08-10 NOTE — Progress Notes (Signed)
Patient ID: Nathaniel Chambers, male   DOB: 1966/01/05, 49 y.o.   MRN: [id number] [facility] Surgery Progress Note:   7 Days Post-Op  Subjective: Mental status is clear.  Still complaining of bilateral lower abdominal pain.  Objective: Vital signs in last 24 hours: Temp:  [98.4 F (36.9 C)-98.6 F (37 C)] 98.5 F (36.9 C) (04/15 0542) Pulse Rate:  [74-83] 74 (04/15 0542) Resp:  [16-18] 16 (04/15 0542) BP: (124-131)/(64-78) 124/64 mmHg (04/15 0542) SpO2:  [96 %-98 %] 98 % (04/15 0542)  Intake/Output from previous day: 04/14 0701 - 04/15 0700 In: 1440 [P.O.:1440] Out: 2000 [Urine:2000] Intake/Output this shift:    Physical Exam: Work of breathing is normal.  Wound staples removed and wound probed but no pus found (CT yesterday was negative).    Lab Results:  Results for orders placed or performed during the hospital encounter of 08/03/14 (from the past 48 hour(s))  CBC with Differential/Platelet     Status: Abnormal   Collection Time: 08/09/14  4:20 AM  Result Value Ref Range   WBC 11.7 (H) 4.0 - 10.5 K/uL   RBC 4.75 4.22 - 5.81 MIL/uL   Hemoglobin 13.4 13.0 - 17.0 g/dL   HCT 41.7 39.0 - 52.0 %   MCV 87.8 78.0 - 100.0 fL   MCH 28.2 26.0 - 34.0 pg   MCHC 32.1 30.0 - 36.0 g/dL   RDW 13.7 11.5 - 15.5 %   Platelets 297 150 - 400 K/uL   Neutrophils Relative % 58 43 - 77 %   Neutro Abs 6.6 1.7 - 7.7 K/uL   Lymphocytes Relative 32 12 - 46 %   Lymphs Abs 3.7 0.7 - 4.0 K/uL   Monocytes Relative 7 3 - 12 %   Monocytes Absolute 0.8 0.1 - 1.0 K/uL   Eosinophils Relative 3 0 - 5 %   Eosinophils Absolute 0.4 0.0 - 0.7 K/uL   Basophils Relative 0 0 - 1 %   Basophils Absolute 0.1 0.0 - 0.1 K/uL  CBC with Differential/Platelet     Status: Abnormal   Collection Time: 08/10/14  4:10 AM  Result Value Ref Range   WBC 12.9 (H) 4.0 - 10.5 K/uL   RBC 4.60 4.22 - 5.81 MIL/uL   Hemoglobin 13.0 13.0 - 17.0 g/dL   HCT 40.7 39.0 - 52.0 %   MCV 88.5 78.0 - 100.0 fL   MCH 28.3 26.0 - 34.0 pg    MCHC 31.9 30.0 - 36.0 g/dL   RDW 14.1 11.5 - 15.5 %   Platelets 321 150 - 400 K/uL   Neutrophils Relative % 61 43 - 77 %   Neutro Abs 7.9 (H) 1.7 - 7.7 K/uL   Lymphocytes Relative 29 12 - 46 %   Lymphs Abs 3.7 0.7 - 4.0 K/uL   Monocytes Relative 6 3 - 12 %   Monocytes Absolute 0.8 0.1 - 1.0 K/uL   Eosinophils Relative 4 0 - 5 %   Eosinophils Absolute 0.5 0.0 - 0.7 K/uL   Basophils Relative 0 0 - 1 %   Basophils Absolute 0.0 0.0 - 0.1 K/uL  Basic metabolic panel     Status: Abnormal   Collection Time: 08/10/14  4:10 AM  Result Value Ref Range   Sodium 137 135 - 145 mmol/L   Potassium 4.0 3.5 - 5.1 mmol/L   Chloride 104 96 - 112 mmol/L   CO2 26 19 - 32 mmol/L   Glucose, Bld 107 (H) 70 - 99 mg/dL  BUN 9 6 - 23 mg/dL   Creatinine, Ser 1.12 0.50 - 1.35 mg/dL   Calcium 8.7 8.4 - 10.5 mg/dL   GFR calc non Af Amer 75 (L) >90 mL/min   GFR calc Af Amer 87 (L) >90 mL/min    Comment: (NOTE) The eGFR has been calculated using the CKD EPI equation. This calculation has not been validated in all clinical situations. eGFR's persistently <90 mL/min signify possible Chronic Kidney Disease.    Anion gap 7 5 - 15    Radiology/Results: Ct Abdomen Pelvis W Contrast  08/09/2014   CLINICAL DATA:  S/p colon surgery bil low pelvic pain Diverticulitis eval for abscess  EXAM: CT ABDOMEN AND PELVIS WITH CONTRAST  TECHNIQUE: Multidetector CT imaging of the abdomen and pelvis was performed using the standard protocol following bolus administration of intravenous contrast.  CONTRAST:  167m OMNIPAQUE IOHEXOL 300 MG/ML  SOLN  COMPARISON:  06/11/2014  FINDINGS: Since prior exam, colonic surgery has been performed. There is a bowel anastomosis staple line along the mid sigmoid colon. There are no discrete colonic diverticula no evidence of diverticulitis.  Appendix also appears to have been removed since the prior exam.  There is small amount of pelvic free fluid and mild hazy opacity is noted in the right pelvic  and lower abdominal mesentery, which is likely residual postoperative edema. There is no evidence of an abscess.  There is no small bowel dilation or wall thickening. No evidence of active small-bowel or colonic inflammation.  Lap band encircles the gastroesophageal junction, stable.  There is mild atelectasis in the right lower lobe. Heart is normal in size.  Subtle stable sub cm low-density liver lesions are likely cysts. Liver otherwise unremarkable.  Spleen, gallbladder, pancreas, adrenal glands:  Unremarkable.  Normal kidneys, ureters and bladder.  Bilateral fat containing inguinal hernias.  These are stable.  Abdominal incision line no along the lower abdomen midline with overlying skin staples, new from the prior exam. No abdominal wall collection is seen to suggest an abscess.  IMPRESSION: 1. Findings reflect recent colonic surgery with resection of a portion of the sigmoid colon and a primary mid sigmoid colon anastomosis. 2. No evidence of residual or recurrent diverticulitis. 3. No evidence of a postoperative abscess. 4. Mild inflammatory change noted in the right lower abdomen and right pelvis, all likely residual postoperative edema. Trace amount of ascites noted in the pelvis.   Electronically Signed   By: DLajean ManesM.D.   On: 08/09/2014 12:04    Anti-infectives: Anti-infectives    Start     Dose/Rate Route Frequency Ordered Stop   08/10/14 1000  metroNIDAZOLE (FLAGYL) tablet 500 mg     500 mg Oral 3 times per day 08/10/14 0911     08/03/14 1930  cefoTEtan (CEFOTAN) 2 g in dextrose 5 % 50 mL IVPB     2 g 100 mL/hr over 30 Minutes Intravenous Every 12 hours 08/03/14 1419 08/03/14 2056   08/03/14 0630  cefoTEtan (CEFOTAN) 2 g in dextrose 5 % 50 mL IVPB  Status:  Discontinued     2 g 100 mL/hr over 30 Minutes Intravenous On call to O.R. 08/03/14 0630 08/03/14 1403   08/03/14 03419 cefoTEtan (CEFOTAN) 2 g in dextrose 5 % 50 mL IVPB  Status:  Discontinued     2 g 100 mL/hr over 30  Minutes Intravenous On call to O.R. 08/03/14 0622204/08/16 0738   08/03/14 0607  neomycin (MYCIFRADIN) tablet 1,000 mg  Status:  Discontinued     1,000 mg Oral 3 times per day 08/03/14 0607 08/03/14 0630   08/03/14 0607  erythromycin (E-MYCIN) tablet 1,000 mg  Status:  Discontinued     1,000 mg Oral 3 times per day 08/03/14 0050 08/03/14 0630      Assessment/Plan: Problem List: Patient Active Problem List   Diagnosis Date Noted  . S/P partial colectomy 08/03/2014  . Meralgia paraesthetica 07/12/2014  . Diverticulitis of large intestine without perforation or abscess without bleeding   . Enteritis due to Clostridium difficile   . Obesity 04/10/2014  . Colonic diverticular abscess 04/04/2014  . Sigmoid diverticulitis 04/04/2014  . Elevated lipase 12/19/2013  . Diverticulitis 12/15/2013  . Asthma 12/15/2013  . Fatty liver 12/15/2013  . 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  Bilateral pelvic pain while not severe was new yesterday and is accompanied by WBC elevation to 12K.  Will check for C dif and begin empiric Flagyl.    7 Days Post-Op    LOS: 7 days   Matt B. Hassell Done, MD, [facility] [phone] beeper [phone]  08/10/2014 9:12 AM

## 2014-08-10 NOTE — Progress Notes (Signed)
Came by to see patient per Dr. Earlie Server request.  Discussed getting back on track with band after discharge.  Patient would like referral to [facility] for diet counseling to get back on track with the band.  Provided information on support group for bariatric surgery.

## 2014-08-11 LAB — CBC WITH DIFFERENTIAL/PLATELET
BASOS PCT: 0 % (ref 0–1)
Basophils Absolute: 0 10*3/uL (ref 0.0–0.1)
EOS ABS: 0.5 10*3/uL (ref 0.0–0.7)
Eosinophils Relative: 4 % (ref 0–5)
HCT: 38.7 % — ABNORMAL LOW (ref 39.0–52.0)
HEMOGLOBIN: 12.3 g/dL — AB (ref 13.0–17.0)
LYMPHS ABS: 3.3 10*3/uL (ref 0.7–4.0)
Lymphocytes Relative: 30 % (ref 12–46)
MCH: 28.1 pg (ref 26.0–34.0)
MCHC: 31.8 g/dL (ref 30.0–36.0)
MCV: 88.6 fL (ref 78.0–100.0)
MONOS PCT: 6 % (ref 3–12)
Monocytes Absolute: 0.7 10*3/uL (ref 0.1–1.0)
NEUTROS ABS: 6.5 10*3/uL (ref 1.7–7.7)
NEUTROS PCT: 60 % (ref 43–77)
Platelets: 286 10*3/uL (ref 150–400)
RBC: 4.37 MIL/uL (ref 4.22–5.81)
RDW: 14 % (ref 11.5–15.5)
WBC: 10.9 10*3/uL — AB (ref 4.0–10.5)

## 2014-08-11 MED ORDER — PROMETHAZINE HCL 12.5 MG PO TABS: 12.5000 mg | ORAL_TABLET | Freq: Four times a day (QID) | ORAL | Status: DC | PRN

## 2014-08-11 MED ORDER — MORPHINE SULFATE 15 MG PO TABS: 15.0000 mg | ORAL_TABLET | ORAL | Status: DC | PRN

## 2014-08-11 MED ORDER — PROMETHAZINE HCL 25 MG PO TABS
25.0000 mg | ORAL_TABLET | ORAL | Status: DC | PRN
  Administered 2014-08-11: 25 mg via ORAL
  Filled 2014-08-11: qty 1

## 2014-08-11 MED ORDER — MORPHINE SULFATE 15 MG PO TABS
15.0000 mg | ORAL_TABLET | ORAL | Status: DC | PRN
  Administered 2014-08-11: 15 mg via ORAL
  Filled 2014-08-11: qty 1

## 2014-08-11 NOTE — Progress Notes (Signed)
8 Days Post-Op  Subjective: Has lower quadrant abdominal pain when he turns in bed.  Tolerating diet. BMs more formed.  Would like to go home.  Objective: Vital signs in last 24 hours: Temp:  [97.8 F (36.6 C)-98.9 F (37.2 C)] 97.8 F (36.6 C) (04/16 0507) Pulse Rate:  [73-88] 73 (04/16 0507) Resp:  [18] 18 (04/16 0507) BP: (100-133)/(64-88) 121/67 mmHg (04/16 0507) SpO2:  [96 %-100 %] 96 % (04/16 0507) Last BM Date: 08/10/14  Intake/Output from previous day: 04/15 0701 - 04/16 0700 In: 1688.8 [P.O.:240; I.V.:1448.8] Out: 1450 [Urine:1450] Intake/Output this shift: Total I/O In: -  Out: 875 [Urine:875]  PE: General- In NAD Abdomen-soft, incisions clean and intact, steri strips on  Lab Results:   C. Diff negative  Recent Labs  08/10/14 0410 08/11/14 0522  WBC 12.9* 10.9*  HGB 13.0 12.3*  HCT 40.7 38.7*  PLT 321 286   BMET  Recent Labs  08/10/14 0410  NA 137  K 4.0  CL 104  CO2 26  GLUCOSE 107*  BUN 9  CREATININE 1.12  CALCIUM 8.7   PT/INR No results for input(s): LABPROT, INR in the last 72 hours. Comprehensive Metabolic Panel:    Component Value Date/Time   NA 137 08/10/2014 0410   NA 135 08/04/2014 0537   K 4.0 08/10/2014 0410   K 3.8 08/04/2014 0537   CL 104 08/10/2014 0410   CL 102 08/04/2014 0537   CO2 26 08/10/2014 0410   CO2 25 08/04/2014 0537   BUN 9 08/10/2014 0410   BUN 9 08/04/2014 0537   CREATININE 1.12 08/10/2014 0410   CREATININE 1.11 08/04/2014 0537   GLUCOSE 107* 08/10/2014 0410   GLUCOSE 138* 08/04/2014 0537   CALCIUM 8.7 08/10/2014 0410   CALCIUM 9.2 08/04/2014 0537   AST 38* 06/16/2014 0500   AST 20 06/11/2014 0519   ALT 38 06/16/2014 0500   ALT 25 06/11/2014 0519   ALKPHOS 50 06/16/2014 0500   ALKPHOS 71 06/11/2014 0519   BILITOT 0.4 06/16/2014 0500   BILITOT 0.6 06/11/2014 0519   PROT 6.6 06/16/2014 0500   PROT 7.9 06/11/2014 0519   ALBUMIN 3.5 06/16/2014 0500   ALBUMIN 4.3 06/11/2014 0519      Studies/Results: Ct Abdomen Pelvis W Contrast  08/09/2014   CLINICAL DATA:  S/p colon surgery bil low pelvic pain Diverticulitis eval for abscess  EXAM: CT ABDOMEN AND PELVIS WITH CONTRAST  TECHNIQUE: Multidetector CT imaging of the abdomen and pelvis was performed using the standard protocol following bolus administration of intravenous contrast.  CONTRAST:  169mL OMNIPAQUE IOHEXOL 300 MG/ML  SOLN  COMPARISON:  06/11/2014  FINDINGS: Since prior exam, colonic surgery has been performed. There is a bowel anastomosis staple line along the mid sigmoid colon. There are no discrete colonic diverticula no evidence of diverticulitis.  Appendix also appears to have been removed since the prior exam.  There is small amount of pelvic free fluid and mild hazy opacity is noted in the right pelvic and lower abdominal mesentery, which is likely residual postoperative edema. There is no evidence of an abscess.  There is no small bowel dilation or wall thickening. No evidence of active small-bowel or colonic inflammation.  Lap band encircles the gastroesophageal junction, stable.  There is mild atelectasis in the right lower lobe. Heart is normal in size.  Subtle stable sub cm low-density liver lesions are likely cysts. Liver otherwise unremarkable.  Spleen, gallbladder, pancreas, adrenal glands:  Unremarkable.  Normal kidneys, ureters and  bladder.  Bilateral fat containing inguinal hernias.  These are stable.  Abdominal incision line no along the lower abdomen midline with overlying skin staples, new from the prior exam. No abdominal wall collection is seen to suggest an abscess.  IMPRESSION: 1. Findings reflect recent colonic surgery with resection of a portion of the sigmoid colon and a primary mid sigmoid colon anastomosis. 2. No evidence of residual or recurrent diverticulitis. 3. No evidence of a postoperative abscess. 4. Mild inflammatory change noted in the right lower abdomen and right pelvis, all likely residual  postoperative edema. Trace amount of ascites noted in the pelvis.   Electronically Signed   By: Lajean Manes M.D.   On: 08/09/2014 12:04    Anti-infectives: Anti-infectives    Start     Dose/Rate Route Frequency Ordered Stop   08/10/14 1000  metroNIDAZOLE (FLAGYL) tablet 500 mg     500 mg Oral 3 times per day 08/10/14 0911     08/03/14 1930  cefoTEtan (CEFOTAN) 2 g in dextrose 5 % 50 mL IVPB     2 g 100 mL/hr over 30 Minutes Intravenous Every 12 hours 08/03/14 1419 08/03/14 2056   08/03/14 0630  cefoTEtan (CEFOTAN) 2 g in dextrose 5 % 50 mL IVPB  Status:  Discontinued     2 g 100 mL/hr over 30 Minutes Intravenous On call to O.R. 08/03/14 0630 08/03/14 1403   08/03/14 8891  cefoTEtan (CEFOTAN) 2 g in dextrose 5 % 50 mL IVPB  Status:  Discontinued     2 g 100 mL/hr over 30 Minutes Intravenous On call to O.R. 08/03/14 6945 08/03/14 0738   08/03/14 0607  neomycin (MYCIFRADIN) tablet 1,000 mg  Status:  Discontinued     1,000 mg Oral 3 times per day 08/03/14 0607 08/03/14 0630   08/03/14 0607  erythromycin (E-MYCIN) tablet 1,000 mg  Status:  Discontinued     1,000 mg Oral 3 times per day 08/03/14 0388 08/03/14 0630      Assessment Active Problems:   S/P partial colectomy and appendectomy for diverticular disease 08/03/14-feeling better today; C. Diff negative; WBC down; wants to go home.    LOS: 8 days   Plan: Discharge today.  Instructions given to him.   Yaire Kreher J 08/11/2014

## 2014-08-11 NOTE — Plan of Care (Signed)
Problem: Consults Goal: Nutrition Consult-if indicated Outcome: Completed/Met Date Met:  08/11/14 Spoke with bariatric coordinator ref his prior lap band sx and current diet

## 2014-08-11 NOTE — Discharge Instructions (Signed)
[facility]      [facility] °[phone] ° °OPEN ABDOMINAL SURGERY: POST OP INSTRUCTIONS ° °Always review your discharge instruction sheet given to you by the facility where your surgery was performed. ° °IF YOU HAVE DISABILITY OR FAMILY LEAVE FORMS, YOU MUST BRING THEM TO THE OFFICE FOR PROCESSING.  PLEASE DO NOT GIVE THEM TO YOUR DOCTOR. ° °1. A prescription for pain medication may be given to you upon discharge.  Take your pain medication as prescribed, if needed.  If narcotic pain medicine is not needed, then you may take acetaminophen (Tylenol) or ibuprofen (Advil) as needed. °2. Take your usually prescribed medications unless otherwise directed. °3. If you need a refill on your pain medication, please contact your pharmacy. They will contact our office to request authorization.  Prescriptions will not be filled after 5pm or on week-ends. °4. You should follow the diet you and your doctor discussed.  Be sure to include lots of fluids daily. Most patients will experience some swelling and bruising in the area of the incision. Ice pack will help. Swelling and bruising can take several days to resolve..  °5. It is common to experience some constipation if taking pain medication after surgery.  Increasing fluid intake and taking a stool softener will usually help or prevent this problem from occurring.  A mild laxative (Milk of Magnesia or Miralax) should be taken according to package directions if there are no bowel movements after 48 hours. °6.  You may have steri-strips (small skin tapes) in place directly over the incision.  These strips should be left on the skin.  If your surgeon used skin glue on the incision, you may shower in 24 hours.  The glue will flake off over the next 2-3 weeks.  Any sutures or staples will be removed at the office during your follow-up visit. You may find that a light gauze bandage over your incision may keep your staples from being rubbed or pulled. You may shower and  replace the bandage daily. °7. ACTIVITIES:  You may resume regular (light) daily activities beginning the next day--such as daily self-care, walking, climbing stairs--gradually increasing activities as tolerated.  You may have sexual intercourse when it is comfortable.  Refrain from any heavy lifting or straining for at least 6 weeks.  Do not lift anything over 10 pounds.  °a. You may drive when you no longer are taking prescription pain medication, you can comfortably wear a seatbelt, and you can safely maneuver your car and apply brakes °b. Return to Work: _When released to do so by doctor.__________________________________ °8. You should see your doctor in the office for a follow-up appointment approximately 2-3 weeks after your surgery.  Make sure that you call for this appointment within a day or two after you arrive home to insure a convenient appointment time. °OTHER INSTRUCTIONS:  °_____________________________________________________________ °_____________________________________________________________ ° °WHEN TO CALL YOUR DOCTOR: °1. Fever over 101.0 °2. Inability to urinate °3. Nausea and/or vomiting °4. Extreme swelling or bruising °5. Continued bleeding from incision. °6. Increased pain, redness, or drainage from the incision. ° °The clinic staff is available to answer your questions during regular business hours.  Please don’t hesitate to call and ask to speak to one of the nurses if you have concerns. ° °For further questions, please visit [email or web address] ° ° °

## 2014-08-11 NOTE — Discharge Summary (Signed)
Reviewed d/c instructions with pt including follow-up appointment, precautions, s/s infection, incision care, and medications.  Pt had no questions and verbalized good understanding of all topics discussed.  Pt waiting for wife to pick him up for d/c.  He is being d/c to home into care of wife.

## 2014-08-13 ENCOUNTER — Encounter: Payer: Self-pay | Admitting: Infectious Diseases

## 2014-08-20 NOTE — Discharge Summary (Signed)
Physician Discharge Summary  Patient ID: Nathaniel Chambers MRN: [id number] DOB/AGE: [birth year] 49 y.o.  Admit date: 08/03/2014 Discharge date: 08/11/2014  Admission Diagnoses:  Recurrent diverticulitis  Discharge Diagnoses:  same  Active Problems:   S/P partial colectomy   Surgery:  Lap assisted sigmoid colectomy  Discharged Condition: improved  Hospital Course:   Had surgery.  Slow return of bowel function.  Had some pain.  CT abdomen done without cause of pain.  Pain resolved and patient able to be discharged  Consults: none  Significant Diagnostic Studies: CT abdomen    Discharge Exam: Blood pressure 121/67, pulse 73, temperature 97.8 F (36.6 C), temperature source Oral, resp. rate 18, height 6\' 2"  (1.88 m), weight 148.326 kg (327 lb), SpO2 97 %. Pain improved.  Discharged per Dr. Zella Richer.   Disposition: 01-Home or Self Care     Medication List    STOP taking these medications        ertapenem 1 g in sodium chloride 0.9 % 50 mL     ondansetron 4 MG tablet  Commonly known as:  ZOFRAN     saccharomyces boulardii 250 MG capsule  Commonly known as:  FLORASTOR     vancomycin 50 mg/mL oral solution  Commonly known as:  VANCOCIN      TAKE these medications        albuterol 108 (90 BASE) MCG/ACT inhaler  Commonly known as:  PROVENTIL HFA;VENTOLIN HFA  Inhale 2 puffs into the lungs every 6 (six) hours as needed for wheezing or shortness of breath (wheezing).     CIALIS 10 MG tablet  Generic drug:  tadalafil  Take 10 mg by mouth daily as needed for erectile dysfunction (erectile dysfunction).     clotrimazole-betamethasone cream  Commonly known as:  LOTRISONE  Apply 1 application topically 2 (two) times daily as needed (rash).     Fluticasone-Salmeterol 250-50 MCG/DOSE Aepb  Commonly known as:  ADVAIR  Inhale 1 puff into the lungs 2 (two) times daily.     gabapentin 300 MG capsule  Commonly known as:  NEURONTIN  Take 1 capsule (300 mg total) by mouth 2  (two) times daily.     HYDROcodone-acetaminophen 5-325 MG per tablet  Commonly known as:  NORCO/VICODIN  Take 1-2 tablets by mouth every 4 (four) hours as needed for moderate pain.     morphine 15 MG tablet  Commonly known as:  MSIR  Take 1 tablet (15 mg total) by mouth every 4 (four) hours as needed for severe pain.     polyethylene glycol packet  Commonly known as:  MIRALAX / GLYCOLAX  Take 17 g by mouth daily.     promethazine 12.5 MG tablet  Commonly known as:  PHENERGAN  Take 1-2 tablets (12.5-25 mg total) by mouth every 6 (six) hours as needed for nausea.     Vitamin D (Ergocalciferol) 50000 UNITS Caps capsule  Commonly known as:  DRISDOL  Take 50,000 Units by mouth every 7 (seven) days. Days of week vary.         SignedPedro Earls 08/20/2014, 9:17 PM

## 2014-09-20 ENCOUNTER — Encounter: Payer: BLUE CROSS/BLUE SHIELD | Attending: Surgery | Admitting: Dietician

## 2014-09-20 ENCOUNTER — Encounter: Payer: Self-pay | Admitting: Dietician

## 2014-09-20 DIAGNOSIS — Z6841 Body Mass Index (BMI) 40.0 and over, adult: Secondary | ICD-10-CM | POA: Insufficient documentation

## 2014-09-20 DIAGNOSIS — Z713 Dietary counseling and surveillance: Secondary | ICD-10-CM | POA: Diagnosis not present

## 2014-09-20 DIAGNOSIS — E669 Obesity, unspecified: Secondary | ICD-10-CM | POA: Diagnosis present

## 2014-09-20 NOTE — Progress Notes (Signed)
  LAGB Refresher  Medical Nutrition Therapy:  Appt start time: 1120 end time:  1200.  Primary concerns today: Post-operative Bariatric Surgery Nutrition Management. Had LAGB in 2007 and lost 83 lbs in about 6 months (lowest weight after surgery was 261 lbs). Starting weight was 344 lbs (current weight is 314 lbs). Had rotator cuff surgery and hernia surgery and gained weight from being inactive. Maintained weight after gaining 15-20 lbs and had another rotator cuff surgery in 2013 and gained most of the weight back. 2 tumorous cancers removed recently, diagnosed with diverticulitis, and had part of colon removed this year. Will need more surgery on 09/28/2014 to remove more tumors.    Has been having a lot of anxiety d/t upcoming surgery and gained a few pounds this week. Feels like he needs to have less starches and not eat as late at night.   Does not feel much restriction in his band though will vomit if he overeats.   Weight loss goal is 210 lbs.   Preferred Learning Style:   No preference indicated   Learning Readiness:   Ready  24-hr recall: B (AM): 2-3 eggs with [place] bacon sometimes  Snk (AM): none  L (PM): bowl of special k with protein sometimes with Life cereal Snk (PM): none or applesauce D (PM): larger portion sizes, chicken fajitas or 1.5 sandwiches, eating more starches than before 4/8 surgery   Snk (PM): crackers  Fluid intake: 1 Gatorade, 17 oz water  Estimated total protein intake: varies   Medications: taking Supplementation: not taking  Using straws: No Drinking while eating: very rarely, waits 15-20 minutes to drink after eating Hair loss: No Carbonated beverages: No N/V/D/C: some constipation, not bad Last Lap-Band fill: several years ago  Recent physical activity:  Hasn't been active d/t surgeries and diverticulities, was active and going to the gym   Progress Towards Goal(s):  In progress.  Handouts given during visit include:  Specialized Post-Op  Diet   Nutritional Diagnosis:  [place]-3.3 Overweight/obesity related to past poor dietary habits and physical inactivity as evidenced by patient w/ hx of LAGB surgery following dietary guidelines for continued weight loss.    Intervention:  Nutrition counseling provided. Goals:  Follow Bariatric Surgery Specialized Post-Op Diet (ideas)  Aim for maximum of 15 grams of carbs per meal/10-15 grams per snack  Avoid starches and starchy veggies (potatoes, peas, corn, etc)  Avoid sweetened drinks (Gatorade)   Eat 3-6 small meals/snacks, every 3-5 hrs  No meal skipping  Increase lean protein foods to meet 80g goal  Always have a protein source with carbs  Increase fluid intake to 64oz + (try Powerade Zero)  Wait 30 minutes before drinking after eating  Aim for >30 min of physical activity daily when possible    Resume daily vitamin supplementation   *(1) complete multivitamin with iron and 2-3 doses of 500 mg calcium   * Make sure to separate the multivitamin and calcium by 2 hours to prevent iron/calcium from binding together and leaving your body  * Make sure to take your calcium in 3 separate doses because your body cannot absorb more than 500 mg at a time  Take Vitamin D prescription  Teaching Method Utilized:  Visual Auditory Hands on  Barriers to learning/adherence to lifestyle change: upcoming surgery, chronic health conditions   Demonstrated degree of understanding via:  Teach Back   Monitoring/Evaluation:  Dietary intake, exercise, lap band fills, and body weight. Follow up in 1 month

## 2014-09-20 NOTE — Patient Instructions (Addendum)
Goals:  Follow Bariatric Surgery Specialized Post-Op Diet (ideas)  Aim for maximum of 15 grams of carbs per meal/10-15 grams per snack  Avoid starches and starchy veggies (potatoes, peas, corn, etc)  Avoid sweetened drinks (Gatorade)   Eat 3-6 small meals/snacks, every 3-5 hrs  No meal skipping  Increase lean protein foods to meet 80g goal  Always have a protein source with carbs  Increase fluid intake to 64oz + (try Powerade Zero)  Wait 30 minutes before drinking after eating  Aim for >30 min of physical activity daily when possible    Resume daily vitamin supplementation   *(1) complete multivitamin with iron and 2-3 doses of 500 mg calcium   * Make sure to separate the multivitamin and calcium by 2 hours to prevent iron/calcium from binding together and leaving your body  * Make sure to take your calcium in 3 separate doses because your body cannot absorb more than 500 mg at a time  Take Vitamin D prescription

## 2014-10-25 ENCOUNTER — Ambulatory Visit: Payer: BLUE CROSS/BLUE SHIELD | Admitting: Dietician

## 2014-12-12 ENCOUNTER — Encounter: Payer: Self-pay | Admitting: Radiation Oncology

## 2014-12-12 NOTE — Progress Notes (Signed)
Histology and Location: Fibromatosis of the Left Posterior Thigh  Path: 06/30/13 Result Narrative  [phone]   DIAGNOSIS   1. SOFT TISSUE MASS, LEFT POSTERIOR THIGH, BIOPSY:  EXTRA-ABDOMINAL DESMOID FIBROMATOSIS. (SEE COMMENT)   2. SOFT TISSUE MASS, LEFT POSTERIOR THIGH, BIOPSY:  EXTRA-ABDOMINAL DESMOID FIBROMATOSIS. (SEE COMMENT)   3. SOFT TISSUE MASS, LEFT POSTERIOR THIGH, BIOPSY:  EXTRA-ABDOMINAL DESMOID FIBROMATOSIS. (SEE COMMENT)   4. SOFT TISSUE MASS, LEFT THIGH, WIDE RESECTION:  EXTRA-ABDOMINAL DESMOID FIBROMATOSIS, 18.0 CM, INVOLVING THE PROXIMAL  AND DEEP MARGINS, FOCALLY.  (SEE COMMENT)   5. SOFT TISSUE MASS, LEFT POSTERIOR HIP, WIDE RESECTION:  EXTRA-ABDOMINAL DESMOID FIBROMATOSIS, 11.3 CM, INVOLVING THE MEDIAL,  DISTAL, DEEP, AND SUPERFICIAL  MARGINS AND IS WITHIN 0.1 CM OF THE PROXIMAL MARGIN. (SEE COMMENT)   6. SOFT TISSUE, LEFT DEEP MEDIAL POSTERIOR HIP MARGIN, BIOPSY:  EXTRA-ABDOMINAL DESMOID FIBROMATOSIS. (SEE COMMENT)   7. SOFT TISSUE, LEFT DEEP INFERIOR HIP, BIOPSY:  EXTRA-ABDOMINAL DESMOID FIBROMATOSIS. (SEE COMMENT)  (SEK)   COMMENT  ALTHOUGH MORE CLASSIC-APPEARING FASCICULAR AREAS CONSISTENT WITH DESMOID  TUMOR ARE PRESENT, THERE ALSO ARE LESS CELLULAR REGIONS AND FOCI OF  EDEMA, MYXOID CHANGES, AND HYALINIZATION.  THE DIFFERENTIAL DIAGNOSIS  INCLUDES DESMOID FIBROMATOSIS AND LOW GRADE FIBROMYXOID SARCOMA.   IMMUNOHISTOCHEMICALLY, THE NEOPLASTIC CELLS APPEAR FOCALLY AND WEAKLY  POSITIVE FOR SMA BUT NEGATIVE FOR EMA, MUC-4, AND S100.  GIVEN THE ABOVE  FINDINGS, I BELIEVE THE BEST DIAGNOSIS IS EXTRA-ABDOMINAL DESMOID  FIBROMATOSIS.    Nilda Simmer MD  PATHOLOGIST  (CASE SIGNED 07/06/2013)   CLINICAL INFORMATION  1. LEFT LEG MASS, NEOPLASM.   SPECIMEN  1. SOFT TISSUE, LEFT THIGH-POSTERIOR-BIOPSY  2. SOFT TISSUE, LEFT THIGH POSTERIOR-BIOPSY  3. SOFT TISSUE, LEFT THIGH POSTERIOR-EXCISION  4. SOFT TISSUE, LEFT THIGH-EXCISION  5. SOFT  TISSUE, LEFT POSTERIOR HIP-EXCISION  6. SOFT TISSUE, LEFT LEG DEEP MEDIAL POSTERIOR-EXCISION  7. SOFT TISSUE, LEFT LEG DEEP INFERIOR MARGIN-EXCISION   GROSS DESCRIPTION  1. Frerichs, Jarel; "BIOPSY LEFT POSTERIOR THIGH MASS". RECEIVED FRESH FOR  INTRAOPERATIVE CONSULTATION ARE TWO FRAGMENTS OF TAN-FLESHY SOFT TISSUE  MEASURING 1.4 CM IN GREATEST DIMENSION. THE SPECIMEN IS SUBMITTED IN  TOTO IN 1FA. (LP) (NLG/RSB)   2. Tignor, Torian; "LEFT THIGH BIOPSY POSTERIOR MASS."  RECEIVED FRESH  FOR INTRAOPERATIVE CONSULTATION IS A 5 CM AGGREGATE OF TAN FLESHY SOFT  TISSUE FRAGMENTS.  HALF OF THIS SPECIMEN IS SUBMITTED IN 2FA AND THE  REMAINDER OF THE SPECIMEN IS ENTIRELY SUBMITTED FOR FROZEN IN TWO  BLOCKS.  NO PERMANENT BLOCKS ARE SUBMITTED.  (LP) (NLG/RB1)   3. Homann, Tyvion; "LEFT THIGH POSTERIOR MASS BIOPSY."  RECEIVED FRESH IS  A 5 CM AGGREGATE OF PINK-TAN, FLESHY SOFT TISSUE.  THE SPECIMEN IS  SUBMITTED IN TOTO IN FOUR CASSETTES.  (LP) (NLG/RB1)   4. Ebbert, Coleton; "POSTERIOR LEFT THIGH SARCOMA."  RECEIVED IN FORMALIN  IS A SOFT TISSUE RESECTION, ORIENTED WITH THE FOLLOWING SUTURES:  LONG  SINGLE STITCH- DISTAL SUPERFICIAL, LONG DOUBLE STITCH- PROXIMAL, SHORT  DOUBLE STITCH- SUPERFICIAL CENTRAL.  THE SPECIMEN MEASURES 17.8 CM FROM  PROXIMAL TO DISTAL X 10.8 CM SUPERFICIAL TO DEEP X 10 CM MEDIAL TO  LATERAL.  THE SPECIMEN IS INKED AS FOLLOWS:  PROXIMAL- BLUE, DISTAL-  GREEN, MEDIAL- RED, LATERAL- YELLOW, DEEP- BLACK, SUPERFICIAL- ORANGE.   THE SPECIMEN IS SERIALLY SECTIONED FROM PROXIMAL TO DISTAL TO REVEAL A  ILL-DEFINED MASS THAT MEASURES APPROXIMATELY 18 CM FROM PROXIMAL TO  DISTAL X 9.5 CM MEDIAL TO LATERAL X 7.4 CM SUPERFICIAL TO DEEP.  THE  MASS HAS A PALE TAN, FLESHY CUT SURFACE.  FOCAL AREAS OF THE LESION  GROSSLY APPEAR WELL ENCAPSULATED.  IT IS SURROUNDED BY SKELETAL MUSCLE.   THE LESION HAS THE FOLLOWING DISTANCES FROM THE MARGINS:  LESS THAN 0.1  CM FROM THE BLUE-INKED PROXIMAL  MARGIN, LESS THAN 0.1 CM FROM THE  BLACK-INKED DEEP MARGIN, 0.3 CM FROM THE YELLOW-INKED LATERAL MARGIN,  0.2 CM FROM THE ORANGE-INKED SUPERFICIAL MARGIN, 0.1 CM FROM THE  RED-INKED MEDIAL MARGIN AND 1.1 CM FROM THE GREEN-INKED DISTAL MARGIN.   THE MASS IS SURROUNDED BY INDURATED FIBROUS TISSUE.  IT CANNOT BE  DETERMINED GROSSLY WHETHER THE FIBROUS AREA IS PART OF THE MASS.   REPRESENTATIVE SECTIONS ARE SUBMITTED AS FOLLOWS:  A, MASS TO BLUE-INKED  PROXIMAL MARGIN.  B, MASS TO BLACK-INKED DEEP MARGIN.  C, FIBROUS TISSUE  TO BLACK-INKED DEEP MARGIN.  D, MASS TO YELLOW-INKED LATERAL MARGIN.  E,  MASS TO ORANGE-INKED SUPERFICIAL MARGIN.  F, MASS TO RED-INKED MEDIAL  MARGIN.  G, REPRESENTATIVE GREEN-INKED DISTAL MARGIN.  H-I, ADDITIONAL  SECTIONS OF MASS. (BRA/RB1,LP)   5. Condron, Tung; "POSTERIOR HIP TUMOR."  RECEIVED IN FORMALIN IS A SOFT  TISSUE RESECTION MARGIN ORIENTED WITH THE FOLLOWING SUTURES:  LONG  STITCH AT DISTAL SUPERFICIAL; PROXIMAL STITCH AT OBTURATOR MARGIN;  DOUBLE SHORT STITCH AT SUPERFICIAL LATERAL.  THE PROXIMAL STITCH IS  PRESUMED TO BE THE DOUBLE LONG SUTURE PRESENT ON THE SPECIMEN.  THE  SPECIMEN MEASURES 11.5 CM FROM PROXIMAL TO DISTAL X 9.2 CM FROM MEDIAL  TO LATERAL X 4.8 CM SUPERFICIAL TO DEEP.  THE SPECIMEN IS INKED AS  FOLLOWS:  PROXIMAL MARGIN- BLUE; DISTAL MARGIN- GREEN; MEDIAL MARGIN-  RED; LATERAL MARGIN- YELLOW; DEEP MARGIN- BLACK; SUPERFICIAL MARGIN-  ORANGE.  THE SPECIMEN IS SERIALLY SECTIONED FROM PROXIMAL TO DISTAL TO  REVEAL A WHITE-TAN INDURATED IRREGULAR MASS THAT MEASURES 11.3 CM FROM  PROXIMAL TO DISTAL X 8.1 CM MEDIAL TO LATERAL X 5.4 CM SUPERFICIAL TO  DEEP.  THE FOLLOWING MARGINS ARE ALL GROSSLY POSITIVE:  RED-INKED MEDIAL  MARGIN, BLACK-INKED DEEP MARGIN, GREEN-INKED DISTAL MARGIN.  THE MASS  MEASURES WITHIN LESS THAN 0.1 CM OF THE ORANGE-INKED SUPERFICIAL MARGIN  AND BLUE-INKED PROXIMAL MARGIN.  THE LATERAL ASPECT OF THE LESION IS  GROSSLY  FIBROTIC.  THE FIBROUS TISSUE GROSSLY EXTENDS TO THE  YELLOW-INKED MARGIN BUT THE MASS DOES NOT APPEAR TO EXTEND TO THE  MARGIN.   REPRESENTATIVE SECTIONS ARE SUBMITTED AS FOLLOWS:  A, MASS TO  BLACK-INKED DEEP MARGIN.  B, MASS TO RED-INKED MEDIAL MARGIN.  C, MASS  TO GREEN-INKED DISTAL MARGIN.  D, MASS TO ORANGE-INKED SUPERFICIAL  MARGIN.  E, MASS TO BLUE-INKED PROXIMAL MARGIN.  F, MASS TO YELLOW-INKED  LATERAL MARGIN. (BRA/RB1,LP)   6. Madrazo, Glade; "DEEP MEDIAL POSTERIOR HIP MARGIN."  RECEIVED IN  FORMALIN IS A 5 CM AGGREGATE OF DIFFUSELY FRIABLE, CAUTERIZED RED-TAN TO  RED-BROWN SOFT TISSUE.  THE MOST INTACT PORTION MEASURES 2.3 X 1.9 X 1.5  CM.  THIS PORTION IS ENTIRELY INKED BLACK.  THE NEXT LARGEST PORTION  MEASURES 2.2 X 1.9 X 0.8 CM AND IS ENTIRELY INKED BLUE.  THE BLACK-INKED  PORTION IS SECTIONED TO REVEAL WHITE-TAN SOFT TISSUE THAT GROSSLY  TOUCHES THE BLACK-INKED MARGIN.  THE BLUE-INKED PORTION IS SERIALLY  SECTIONED TO REVEAL SIMILAR APPEARING WHITE-TAN CUT SURFACE AT THE  BLUE-INKED MARGIN.   REPRESENTATIVE SECTIONS ARE SUBMITTED AS FOLLOWS:  A, SMALL FRIABLE  FRAGMENTS.  B-C, SECTIONS OF BLACK-INKED PORTION.  D, SECTIONS OF  BLUE-INKED PORTION. (BRA/RB1)   7. Sarabia, Jayzon; "DEEP INFERIOR HIP."  RECEIVED IN FORMALIN IS A 0.8 CM  WHITE-TAN IRREGULAR SOFT TISSUE FRAGMENT THAT IS TRISECTED AND ENTIRELY  SUBMITTED IN ONE CASSETTE.  (LP) (BRA/RB1)    FROZEN SECTION:  1. 1FA.  "BLAND-APPEARING SPINDLE CELL PROCESS, CANNOT EXCLUDE LOW  GRADE SARCOMA."  [place] REPORT TO DR. Leonides Schanz, 06/30/13, 2:43 P.M.  Nilda Simmer, M.D.    Nilda Simmer MD  PATHOLOGIST  (FROZEN SECTION SIGNED 07/06/2013)  REPORTED: 2013-07-06 AT 1546   2. 2FA-B.  "AT LEAST LOW GRADE SARCOMA."  [facility] REPORT TO DR. Leonides Schanz, 06/30/13, 3.20 P.Claudean Kinds, M.D.    Nilda Simmer MD  PATHOLOGIST  (FROZEN SECTION SIGNED 07/06/2013)  REPORTED: 2013-07-06 AT 28    THE ABOVE  DIAGNOSIS IS BASED ON PERFORMANCE OF THOROUGH GROSS AND/OR  MICROSCOPIC EVALUATIONS.  IMMUNOPEROXIDASE PROCEDURES WERE DEVELOPED AND  PERFORMANCE CHARACTERISTICS DETERMINED BY PATHOLOGISTS DIAG[facility]ST[facility]  [facility].  NOT ALL HAVE BEEN CLEARED OR APPROVED BY THE [place] [employer]OD AND  DRUG ADMINISTRATION.    Performed by: [facility] [address] [place], [place], [place] [address]     SPECIMEN  1. SKIN, LEFT SCALP AREA-EXCISION-  2. SKIN, RIGHT THIGH-EXCISION-   GROSS DESCRIPTION  1. Browe, Chosen; "LEFT SCALP AREA."  RECEIVED IN FORMALIN ARE MULTIPLE  IRREGULAR TAN-PINK SOFT TISSUE FRAGMENTS THAT RANGE IN SIZE FROM 0.3 UP  TO 1.7 CM.  THE LARGEST FRAGMENT IS FRIABLE UPON MANIPULATION.  THE  SPECIMEN IS ENTIRELY SUBMITTED IN THREE TOTAL CASSETTES WITH THE LARGER  FRAGMENT SERIALLY SECTIONED IN CASSETTE C.    2. Mccaster, Joniel; "RIGHT THIGH SKIN LESION."  RECEIVED IN FORMALIN IS A  1.6 X 0.6 CM ELLIPSE OF TAN-BROWN WRINKLED SKIN EXCISED TO A DEPTH OF  1.2 CM.  THE SKIN SURFACE IS GROSSLY UNREMARKABLE.  THE RESECTION MARGIN  IS INKED BLACK.  THE SPECIMEN IS SERIALLY SECTIONED AND SUBMITTED WITH  THE TIPS IN A AND CENTRAL CROSS-SECTIONS IN B.  (LP)    (BRA/RB1)   THE ABOVE DIAGNOSIS IS BASED ON PERFORMANCE OF THOROUGH GROSS AND/OR  MICROSCOPIC EVALUATIONS.  IMMUNOPEROXIDASE PROCEDURES WERE DEVELOPED AND  PERFORMANCE CHARACTERISTICS DETERMINED BY PATHOLOGISTS DIAGNOSTIC  LABORATORY.  NOT ALL HAVE BEEN CLEARED OR APPROVED BY THE [place] FOOD AND  DRUG ADMINISTRATION.   Doran Heater presented in January 2015 with a questionable "pulled thigh" along his left thigh.  He was seen by Dr.  Drema Dallas at [facility]. An MRI of his left thigh demonstrated a left posterior thigh mass.  He was referred to Dr. Gwyndolyn Saxon Ward in [place] with another repeat MRI which demonstrated an 11 x 25 cm mass extending medially and posteriorly to the femoral neck and distally into the  hamstrings.He was taken to the OR for a radical resection of the soft tissue thigh tumor measuring 19 x 12 cm and resection of a complex  posterior buttocks mass measuring  10/15 cm.  Past/Anticipated interventions by patient's surgeon/dermatologist for current problematic lesion, if any:  Radical Resection of the Desmoid from the left posterior thigh and buttocks  Pain: Tenderness and itching.  Taking Gabapentin  Mobility:  Cane when ambulating  SAFETY ISSUES:  Prior radiation? No  Pacemaker/ICD? No  Possible current pregnancy? N/A  Is the patient on methotrexate?   Current Complaints / other details:

## 2014-12-13 ENCOUNTER — Ambulatory Visit
Admission: RE | Admit: 2014-12-13 | Discharge: 2014-12-13 | Disposition: A | Discharge status: Home / Self Care | Payer: BLUE CROSS/BLUE SHIELD | Source: Ambulatory Visit | Attending: Radiation Oncology | Admitting: Radiation Oncology

## 2014-12-13 ENCOUNTER — Encounter: Payer: Self-pay | Admitting: Radiation Oncology

## 2014-12-13 VITALS — BP 150/100 | HR 75 | Temp 98.0°F | Ht 74.0 in | Wt 238.4 lb

## 2014-12-13 DIAGNOSIS — Z885 Allergy status to narcotic agent status: Secondary | ICD-10-CM | POA: Diagnosis not present

## 2014-12-13 DIAGNOSIS — Z880 Allergy status to penicillin: Secondary | ICD-10-CM | POA: Diagnosis not present

## 2014-12-13 DIAGNOSIS — D481 Neoplasm of uncertain behavior of connective and other soft tissue: Secondary | ICD-10-CM

## 2014-12-13 DIAGNOSIS — Z9109 Other allergy status, other than to drugs and biological substances: Secondary | ICD-10-CM | POA: Insufficient documentation

## 2014-12-13 DIAGNOSIS — Z510 Encounter for antineoplastic radiation therapy: Secondary | ICD-10-CM | POA: Diagnosis not present

## 2014-12-13 DIAGNOSIS — M792 Neuralgia and neuritis, unspecified: Secondary | ICD-10-CM

## 2014-12-13 NOTE — Progress Notes (Signed)
Saw Radiation Therapy Video today prior to [place] visit with Dr. Valere Dross.

## 2014-12-13 NOTE — Addendum Note (Signed)
Encounter addended by: Benn Moulder, RN on: 12/13/2014 11:10 AM<BR>     Documentation filed: Charges VN

## 2014-12-13 NOTE — Addendum Note (Signed)
Encounter addended by: Benn Moulder, RN on: 12/13/2014 11:12 AM<BR>     Documentation filed: Charges VN

## 2014-12-13 NOTE — Progress Notes (Signed)
[facility] Follow up Note  Name: Nathaniel Chambers   Date:   12/13/2014 MRN:  [id number] DOB: 01-30-1966   CC:  Nathaniel Smolder, MD  Ward, Nathaniel Chambers., MD  DIAGNOSIS: Desmoid fibromatosis, recurrent     ALLERGIES: Penicillins; Solu-medrol; Dilaudid; Oxycodone; and Percocet   MEDICATIONS:  Current Outpatient Prescriptions  Medication Sig Dispense Refill  . albuterol (PROVENTIL HFA;VENTOLIN HFA) 108 (90 BASE) MCG/ACT inhaler Inhale 2 puffs into the lungs every 6 (six) hours as needed for wheezing or shortness of breath (wheezing).     . Fluticasone-Salmeterol (ADVAIR) 250-50 MCG/DOSE AEPB Inhale 1 puff into the lungs 2 (two) times daily.     Marland Kitchen morphine (MSIR) 15 MG tablet Take 1 tablet (15 mg total) by mouth every 4 (four) hours as needed for severe pain. 40 tablet 0  . polyethylene glycol (MIRALAX / GLYCOLAX) packet Take 17 g by mouth daily. 14 each 0  . promethazine (PHENERGAN) 12.5 MG tablet Take 1-2 tablets (12.5-25 mg total) by mouth every 6 (six) hours as needed for nausea. 30 tablet 1  . Vitamin D, Ergocalciferol, (DRISDOL) 50000 UNITS CAPS capsule Take 50,000 Units by mouth every 7 (seven) days. Days of week vary.    . clotrimazole-betamethasone (LOTRISONE) cream Apply 1 application topically 2 (two) times daily as needed (rash).   0  . gabapentin (NEURONTIN) 300 MG capsule Take 1 capsule (300 mg total) by mouth 2 (two) times daily. 180 capsule 3  . HYDROcodone-acetaminophen (NORCO/VICODIN) 5-325 MG per tablet Take 1-2 tablets by mouth every 4 (four) hours as needed for moderate pain. (Patient not taking: Reported on 12/13/2014) 30 tablet 0  . tadalafil (CIALIS) 10 MG tablet Take 10 mg by mouth daily as needed for erectile dysfunction (erectile dysfunction).      No current facility-administered medications for this encounter.     NARRATIVE:  Nathaniel Chambers is a most pleasant 49 year old male who is seen today through the courtesy of Nathaniel Chambers for  consideration of postoperative ration therapy in the management of his desmoid tumor/aggressive fibromatosis.  I first saw the patient in consultation on 10/19/2013.  To review, The patient felt that he had a "pulled muscle" along his left thigh in January 2015. He was seen at [facility] where he saw Nathaniel Chambers. He obtained a MRI scan which showed a left posterior thigh mass. He was referred to Nathaniel Chambers in [place]. I believe that he had a repeat MRI scan which showed an 11 x 25 cm mass extending medially and posteriorly to the femoral neck and distally into the hamstrings. He was taken to the OR on 06/30/2013 where he underwent radical resection of a the soft tissue thigh tumor measuring 19 x 12 cm and resection of a complex posterior buttocks mass measuring 10 x 15 cm through a buttock extension of a thigh incision with the final incision measuring 55 cm. The masses were discontinuous. There was soft tissue rearrangements including the biceps femoris, semimembranosus and semi-tendinosis local muscle transfer. On review of his pathology he was found to have a desmoid tumor from the left posterior hip measuring 11.3 cm, involving the medial, distal, deep, and superficial margins and within 0.1 cm of the proximal margin. The left thigh mass appears histologically similar and measured 18 cm. This involved the proximal and deep margins, focally. Comment is made that there were also less cellular regions and foci of edema, myxoid changes and hyalinization such that the differential diagnosis would  also include a low-grade fibro- myxoid sarcoma.  He did well postoperatively following his first surgery although he had some right sided thigh discomfort for which she was placed on gabapentin.  At that time we decided to follow him with MRI scans in order to proceed with radiation therapy if he had a future recurrence.  On 08/03/2014, he underwent a partial colectomy for recurrent sigmoid  diverticulitis with Nathaniel Chambers here in [place].  A follow-up MRI scan on 08/20/2014 showed postsurgical changes of the left buttocks and thigh with new areas of nodular enhancement apparently representing recurrent neoplasm along the inferior lateral margin of the left pubic ramus within the deep aspect of the posterior compartment starting approximately 20 cm above the knee joint with curvilinear extension along the lateral and posterior aspect of the sciatic neurovascular bundle and extending inferiorly along the tendon sheath of the semi-tendinosis.  A third separate area of nodular enhancement was present about the deep aspect of the semimembranosus muscle centered approximately 8 cm above the knee.  Postsurgical changes of the posterior compartment of the left thigh were seen with muscle atrophy.  There was curvilinear area of enhancement about the posterior aspect of the femoral neck at the level of the quadratus femoris appearing slightly smaller than on the previous exam and may represent scarring or chronic bursal formation in this region.  He was taken to the OR by Nathaniel Chambers for resection of soft tissue tumor along the left posterior thigh measuring 6 x 15 cm.  He also had neuro lysis of the sciatic nerve was soft tissue rearrangement.  The palpable mass along the posterior thigh extended in multiple directions.  He was able to get the vast majority of the nerve clean but approximately 15% of the sciatic nerve surface in the area of the dissection had dense fibrosis to it and he was only able to debulk the tumor, leading some on the nerve to avoid sacrifice of the nerve.  The lesion size was approximately 6-7 cm in diameter and approximately 12 cm proximal to distal in the main area.  However, there was extension going down distally and medially for at least an additional 10 cm were extended to within 5-6  cm of the true knee joint.  Nathaniel Chambers resected additional small fragments of fibrotic tissue  multiple directions with additional neuro lysis of the sciatic nerve.  Frozen section confirmed recurrent desmoid tumor.  He performed rearrangement of soft tissue of the semitendinosus to cover the posterior medial aspect of the tissues and coverage of the sciatic nerve.  Pathology showed desmoid tumor, focally reaching surgical margins of the left posterior thigh.  He underwent extensive rehabilitation and is doing well.  He does walk with a cane, but does have excellent left lower extremity strength.   PHYSICAL EXAM:   height is 6\' 2"  (1.88 m) and weight is 238 lb 6.4 oz (108.138 kg). His temperature is 98 F (36.7 C). His blood pressure is 150/100 and his pulse is 75.  Alert and oriented.  On inspection of his posterior pelvis/extremities there is a surgical scar extending from just above the level of the mid o'clock along the left mid buttock down to the superior aspect of the popliteal fossa.  There is a mild volume defect.  There is no palpable evidence for recurrent disease.  He is good range of motion of his hip, and knee.  Left lower posterior extremity strength including hip and knee flexion/extension is excellent.  There  is slightly diminished sensation to light touch along the mid to lower posterior left thigh.   LABORATORY DATA:  Lab Results  Component Value Date   WBC 10.9* 08/11/2014   HGB 12.3* 08/11/2014   HCT 38.7* 08/11/2014   MCV 88.6 08/11/2014   PLT 286 08/11/2014   Lab Results  Component Value Date   NA 137 08/10/2014   K 4.0 08/10/2014   CL 104 08/10/2014   CO2 26 08/10/2014   Lab Results  Component Value Date   ALT 38 06/16/2014   AST 38* 06/16/2014   ALKPHOS 50 06/16/2014   BILITOT 0.4 06/16/2014       IMPRESSION: Recurrent desmoid tumor/aggressive fibromatosis.  In the setting of recurrent disease and positive margins, I do recommend postoperative radiation therapy to improve local regional control.  We discussed the potential acute and late toxicities of  radiation therapy including neurovascular damage and chronic lymphedema/pain.    PLAN: I would like to review his outside MRI scans, and once I have reviewed his scans he will return here for CT simulation.  From a technical standpoint, he will be treated with external beam/IMRT with Tomotherapy.  I plan to deliver at least 5000 cGy to his initial tumor bed, perhaps slightly more to his areas of suspected persistent disease.   I spent 60  minutes face to face with the patient and more than 50% of that time was spent in counseling and/or coordination of care.

## 2014-12-17 ENCOUNTER — Encounter: Payer: Self-pay | Admitting: Radiation Oncology

## 2014-12-17 NOTE — Progress Notes (Signed)
Forwarded disability for RN for processing

## 2014-12-18 ENCOUNTER — Other Ambulatory Visit: Payer: Self-pay | Admitting: Radiation Oncology

## 2014-12-18 ENCOUNTER — Inpatient Hospital Stay
Admission: RE | Admit: 2014-12-18 | Discharge: 2014-12-18 | Disposition: A | Discharge status: Home / Self Care | Payer: Self-pay | Source: Ambulatory Visit | Attending: Radiation Oncology | Admitting: Radiation Oncology

## 2014-12-18 DIAGNOSIS — C4921 Malignant neoplasm of connective and soft tissue of right lower limb, including hip: Secondary | ICD-10-CM

## 2014-12-19 ENCOUNTER — Ambulatory Visit
Admission: RE | Admit: 2014-12-19 | Discharge: 2014-12-19 | Disposition: A | Discharge status: Home / Self Care | Payer: BLUE CROSS/BLUE SHIELD | Source: Ambulatory Visit | Attending: Radiation Oncology | Admitting: Radiation Oncology

## 2014-12-19 DIAGNOSIS — Z510 Encounter for antineoplastic radiation therapy: Secondary | ICD-10-CM | POA: Diagnosis not present

## 2014-12-19 DIAGNOSIS — D481 Neoplasm of uncertain behavior of connective and other soft tissue: Secondary | ICD-10-CM

## 2014-12-20 ENCOUNTER — Encounter: Payer: Self-pay | Admitting: Radiation Oncology

## 2014-12-20 NOTE — Addendum Note (Signed)
Encounter addended by: Benn Moulder, RN on: 12/20/2014  5:34 PM<BR>     Documentation filed: Visit Diagnoses

## 2014-12-20 NOTE — Progress Notes (Signed)
Faxed to [place] at [place] the physician statement for disability. Fax# [phone]. Confirmation of fax rec'd.

## 2014-12-21 NOTE — Progress Notes (Signed)
CC: Dr. Gwyndolyn Saxon Ward  I reviewed all of Nathaniel Chambers' outside MRI scans from [place] and also from [facility] with Dr. Janeece Fitting of musculoskeletal radiology here at [facility].  To my knowledge, his last MRI scan was on 08/20/2014 which was a preoperative scan.  I spoke with Dr. Gwyndolyn Saxon Ward yesterday evening and reviewed Nathaniel Chambers' case.  Dr. Leonides Schanz may obtain a baseline MRI scan [facility] for comparison in the future.  We will start his IMRT with Tomotherapy on Tuesday, September 6.

## 2014-12-21 NOTE — Progress Notes (Signed)
Complex simulation/treatment planning note on 12/19/2014 Nathaniel Chambers was taken to the CT simulator.  I marked his left buttock and proximal left lower extremity scar with a radiopaque wire.  He was placed supine in a Vac lock immobilization device was constructed with a Styrofoam wedge the placed between his thighs.  He was then scanned.  The CT data set was sent to the MIM planning system where I contoured his sciatic/femoral nerve, high-risk CTV (CTV 56) and also low risk CTV (CTV 50.4).  Each of the CTV's were expanded by 0.8 cm to create respective PTV's.  These volumes will receive 5600 cGy in 28 sessions and also 5040 cGy 28 sessions.  I reviewed with dosimetry the need for a lymphatic strip anteriorly and also to avoid treatment of the entire femur circumference and respecting the femoral head and neck tolerance.  He is now ready for IMRT simulation/treatment planning.

## 2014-12-25 ENCOUNTER — Encounter: Payer: Self-pay | Admitting: Radiation Oncology

## 2014-12-25 NOTE — Progress Notes (Signed)
Faxed to [employer] at [phone] an appeal letter made out by Dr. Arloa Koh. Rec'd successful fax receipt. Letter and fax receipt are scanned.

## 2014-12-27 ENCOUNTER — Encounter: Payer: Self-pay | Admitting: Radiation Oncology

## 2014-12-27 DIAGNOSIS — Z510 Encounter for antineoplastic radiation therapy: Secondary | ICD-10-CM | POA: Diagnosis not present

## 2014-12-27 NOTE — Progress Notes (Signed)
IMRT simulation/treatment planning note: [name] underwent IMRT simulation/treatment planning for postoperative treatment to his left proximal lower extremity/adjacent inferior pelvis.  IMRT was chosen to decrease the risk for both acute and late toxicities including the femoral neck head and femoral shaft in addition to what would be a desquamating recent dermatitis of the genitalia.  In addition, we were able to leave a lymphatic strip along his anterolateral left thigh.  Dose volume histograms were obtained for the target structures including PTV 50 and PTV 56.  Dose volume histograms were obtained for the femoral head/neck and femoral shaft, nerve, and lymphatic strip.  We met our departmental guidelines.  He is being treated with 6 MV photons, helical IMRT.  I'm prescribing 5600 cGy to his PTV 56 and 28 sessions and 5000 cGy to his PTV 50 in 28 sessions.

## 2015-01-01 ENCOUNTER — Ambulatory Visit: Payer: BLUE CROSS/BLUE SHIELD | Admitting: Radiation Oncology

## 2015-01-01 ENCOUNTER — Ambulatory Visit
Admission: RE | Admit: 2015-01-01 | Discharge: 2015-01-01 | Disposition: A | Discharge status: Home / Self Care | Payer: BLUE CROSS/BLUE SHIELD | Source: Ambulatory Visit | Attending: Radiation Oncology | Admitting: Radiation Oncology

## 2015-01-01 ENCOUNTER — Ambulatory Visit
Admission: RE | Admit: 2015-01-01 | Payer: BLUE CROSS/BLUE SHIELD | Source: Ambulatory Visit | Admitting: Radiation Oncology

## 2015-01-01 DIAGNOSIS — Z510 Encounter for antineoplastic radiation therapy: Secondary | ICD-10-CM | POA: Diagnosis not present

## 2015-01-02 ENCOUNTER — Ambulatory Visit
Admission: RE | Admit: 2015-01-02 | Discharge: 2015-01-02 | Disposition: A | Discharge status: Home / Self Care | Payer: BLUE CROSS/BLUE SHIELD | Source: Ambulatory Visit | Attending: Radiation Oncology | Admitting: Radiation Oncology

## 2015-01-02 ENCOUNTER — Ambulatory Visit: Payer: BLUE CROSS/BLUE SHIELD | Admitting: Radiation Oncology

## 2015-01-03 ENCOUNTER — Ambulatory Visit
Admission: RE | Admit: 2015-01-03 | Payer: BLUE CROSS/BLUE SHIELD | Source: Ambulatory Visit | Admitting: Radiation Oncology

## 2015-01-04 ENCOUNTER — Ambulatory Visit: Payer: BLUE CROSS/BLUE SHIELD

## 2015-01-07 ENCOUNTER — Ambulatory Visit
Admission: RE | Admit: 2015-01-07 | Payer: BLUE CROSS/BLUE SHIELD | Source: Ambulatory Visit | Admitting: Radiation Oncology

## 2015-01-07 ENCOUNTER — Ambulatory Visit: Payer: BLUE CROSS/BLUE SHIELD

## 2015-01-07 ENCOUNTER — Ambulatory Visit
Admission: RE | Admit: 2015-01-07 | Discharge: 2015-01-07 | Disposition: A | Discharge status: Home / Self Care | Payer: BLUE CROSS/BLUE SHIELD | Source: Ambulatory Visit | Attending: Radiation Oncology | Admitting: Radiation Oncology

## 2015-01-08 ENCOUNTER — Ambulatory Visit
Admission: RE | Admit: 2015-01-08 | Discharge: 2015-01-08 | Disposition: A | Discharge status: Home / Self Care | Payer: BLUE CROSS/BLUE SHIELD | Source: Ambulatory Visit | Attending: Radiation Oncology | Admitting: Radiation Oncology

## 2015-01-08 ENCOUNTER — Ambulatory Visit: Payer: BLUE CROSS/BLUE SHIELD

## 2015-01-09 ENCOUNTER — Ambulatory Visit: Payer: BLUE CROSS/BLUE SHIELD

## 2015-01-09 ENCOUNTER — Ambulatory Visit
Admission: RE | Admit: 2015-01-09 | Discharge: 2015-01-09 | Disposition: A | Discharge status: Home / Self Care | Payer: BLUE CROSS/BLUE SHIELD | Source: Ambulatory Visit | Attending: Radiation Oncology | Admitting: Radiation Oncology

## 2015-01-10 ENCOUNTER — Ambulatory Visit
Admission: RE | Admit: 2015-01-10 | Discharge: 2015-01-10 | Disposition: A | Discharge status: Home / Self Care | Payer: BLUE CROSS/BLUE SHIELD | Source: Ambulatory Visit | Attending: Radiation Oncology | Admitting: Radiation Oncology

## 2015-01-10 ENCOUNTER — Ambulatory Visit: Payer: BLUE CROSS/BLUE SHIELD

## 2015-01-11 ENCOUNTER — Ambulatory Visit: Payer: BLUE CROSS/BLUE SHIELD

## 2015-01-11 ENCOUNTER — Ambulatory Visit
Admission: RE | Admit: 2015-01-11 | Discharge: 2015-01-11 | Disposition: A | Discharge status: Home / Self Care | Payer: BLUE CROSS/BLUE SHIELD | Source: Ambulatory Visit | Attending: Radiation Oncology | Admitting: Radiation Oncology

## 2015-01-14 ENCOUNTER — Ambulatory Visit
Admission: RE | Admit: 2015-01-14 | Discharge: 2015-01-14 | Disposition: A | Discharge status: Home / Self Care | Payer: BLUE CROSS/BLUE SHIELD | Source: Ambulatory Visit | Attending: Radiation Oncology | Admitting: Radiation Oncology

## 2015-01-14 ENCOUNTER — Ambulatory Visit: Payer: BLUE CROSS/BLUE SHIELD

## 2015-01-14 ENCOUNTER — Encounter: Payer: Self-pay | Admitting: Radiation Oncology

## 2015-01-14 VITALS — BP 128/76 | HR 87 | Temp 98.6°F | Resp 20 | Wt 338.6 lb

## 2015-01-14 DIAGNOSIS — Z510 Encounter for antineoplastic radiation therapy: Secondary | ICD-10-CM | POA: Diagnosis not present

## 2015-01-14 DIAGNOSIS — D481 Neoplasm of uncertain behavior of connective and other soft tissue: Secondary | ICD-10-CM

## 2015-01-14 NOTE — Progress Notes (Signed)
Weekly Management Note:  Site: proximal left lower extremity Current Dose:   200  cGy Projected Dose:  5600  cGy  Narrative: The patient is seen today for routine under treatment assessment. CBCT/MVCT images/port films were reviewed. The chart was reviewed.    Mr. Bartoszek begins his IMRT today after receiving approval from [facility]. He is without new complaints today.  His treatment setup was excellent.  Physical Examination:  Filed Vitals:   01/14/15 1603  BP: 128/76  Pulse: 87  Temp: 98.6 F (37 C)  Resp: 20  .  Weight: 338 lb 9.6 oz (153.588 kg).  No change.  Impression: Tolerating radiation therapy well.  Plan: Continue radiation therapy as planned.

## 2015-01-14 NOTE — Progress Notes (Signed)
See dictated note from earlier today. 

## 2015-01-14 NOTE — Progress Notes (Signed)
Weekly rad txs left posterior thigh  1/28  Completd, will do patient education tomorrow, no c/o pain at present,  4:08 PM BP 128/76 mmHg  Pulse 87  Temp(Src) 98.6 F (37 C) (Oral)  Resp 20  Wt 338 lb 9.6 oz (153.588 kg)  .k

## 2015-01-14 NOTE — Progress Notes (Signed)
Chart note: please see dictation from earlier today for weekly management note. The patient underwent Tomotherapy segmentation for his first treatment to his proximal left lower extremity.  He was treated to 13.2 delivered field widths corresponding to one set of IMRT treatment devices [phone]).

## 2015-01-15 ENCOUNTER — Ambulatory Visit
Admission: RE | Admit: 2015-01-15 | Discharge: 2015-01-15 | Disposition: A | Discharge status: Home / Self Care | Payer: BLUE CROSS/BLUE SHIELD | Source: Ambulatory Visit | Attending: Radiation Oncology | Admitting: Radiation Oncology

## 2015-01-15 DIAGNOSIS — Z510 Encounter for antineoplastic radiation therapy: Secondary | ICD-10-CM | POA: Diagnosis not present

## 2015-01-15 DIAGNOSIS — D481 Neoplasm of uncertain behavior of connective and other soft tissue: Secondary | ICD-10-CM

## 2015-01-15 MED ORDER — BIAFINE EX EMUL
CUTANEOUS | Status: DC | PRN
  Administered 2015-01-15: 17:00:00 via TOPICAL

## 2015-01-15 NOTE — Progress Notes (Signed)
Pt here for patient teaching.  Pt given Radiation and You booklet, skin care instructions and Biafine. Pt reports they have watched the Radiation Therapy Education video on December 13, 2014.  Reviewed areas of pertinence such as fatigue, hair loss and skin changes . Pt able to give teach back of to pat skin, use unscented/gentle soap and drink plenty of water,apply Biafine bid and avoid applying anything to skin within 4 hours of treatment. Pt demonstrated understanding of information given and will contact nursing with any questions or concerns.

## 2015-01-16 ENCOUNTER — Ambulatory Visit
Admission: RE | Admit: 2015-01-16 | Discharge: 2015-01-16 | Disposition: A | Discharge status: Home / Self Care | Payer: BLUE CROSS/BLUE SHIELD | Source: Ambulatory Visit | Attending: Radiation Oncology | Admitting: Radiation Oncology

## 2015-01-16 DIAGNOSIS — Z510 Encounter for antineoplastic radiation therapy: Secondary | ICD-10-CM | POA: Diagnosis not present

## 2015-01-17 ENCOUNTER — Ambulatory Visit
Admission: RE | Admit: 2015-01-17 | Discharge: 2015-01-17 | Disposition: A | Discharge status: Home / Self Care | Payer: BLUE CROSS/BLUE SHIELD | Source: Ambulatory Visit | Attending: Radiation Oncology | Admitting: Radiation Oncology

## 2015-01-17 DIAGNOSIS — Z510 Encounter for antineoplastic radiation therapy: Secondary | ICD-10-CM | POA: Diagnosis not present

## 2015-01-18 ENCOUNTER — Ambulatory Visit
Admission: RE | Admit: 2015-01-18 | Discharge: 2015-01-18 | Disposition: A | Discharge status: Home / Self Care | Payer: BLUE CROSS/BLUE SHIELD | Source: Ambulatory Visit | Attending: Radiation Oncology | Admitting: Radiation Oncology

## 2015-01-18 DIAGNOSIS — Z510 Encounter for antineoplastic radiation therapy: Secondary | ICD-10-CM | POA: Diagnosis not present

## 2015-01-21 ENCOUNTER — Ambulatory Visit
Admission: RE | Admit: 2015-01-21 | Discharge: 2015-01-21 | Disposition: A | Discharge status: Home / Self Care | Payer: BLUE CROSS/BLUE SHIELD | Source: Ambulatory Visit | Attending: Radiation Oncology | Admitting: Radiation Oncology

## 2015-01-21 ENCOUNTER — Encounter: Payer: Self-pay | Admitting: Radiation Oncology

## 2015-01-21 ENCOUNTER — Ambulatory Visit: Payer: BLUE CROSS/BLUE SHIELD | Admitting: Radiation Oncology

## 2015-01-21 VITALS — BP 139/79 | HR 78 | Temp 97.8°F | Ht 74.0 in | Wt 342.2 lb

## 2015-01-21 DIAGNOSIS — D481 Neoplasm of uncertain behavior of connective and other soft tissue: Secondary | ICD-10-CM

## 2015-01-21 DIAGNOSIS — Z510 Encounter for antineoplastic radiation therapy: Secondary | ICD-10-CM | POA: Diagnosis not present

## 2015-01-21 NOTE — Progress Notes (Signed)
Weekly Management Note:  Site: proximal left lower extremity Current Dose:12 Gy Projected Dose:  56 Gy  Narrative: The patient is seen today for routine under treatment assessment. CBCT/MVCT images/port films were reviewed. The chart was reviewed.     Mr. Kohlmeyer has received 6 fractions to his left posterior thigh.  He reports tightness in left posterior thigh with intermittent throbbing in this area since start of treatment.  Physical Examination:  Filed Vitals:   01/21/15 1220  BP: 139/79  Pulse: 78  Temp: 97.8 F (36.6 C)  .  Weight: 342 lb 3.2 oz (155.221 kg). Slightly dry skin over posterior thigh (left).  Impression: Tolerating radiation therapy well.  Plan: Continue radiation therapy as planned. Use lotion provided by nursing for skin care.  -----------------------------------  Eppie Gibson, MD

## 2015-01-21 NOTE — Progress Notes (Signed)
Nathaniel Chambers has received 6 fractions to his left posterior thigh.  He reports tightness in left posterior thigh with intermittent throbbing in this area since start of treatment. BP 139/79 mmHg  Pulse 78  Temp(Src) 97.8 F (36.6 C)  Ht 6\' 2"  (1.88 m)  Wt 342 lb 3.2 oz (155.221 kg)  BMI 43.92 kg/m2

## 2015-01-22 ENCOUNTER — Ambulatory Visit
Admission: RE | Admit: 2015-01-22 | Discharge: 2015-01-22 | Disposition: A | Discharge status: Home / Self Care | Payer: BLUE CROSS/BLUE SHIELD | Source: Ambulatory Visit | Attending: Radiation Oncology | Admitting: Radiation Oncology

## 2015-01-22 DIAGNOSIS — Z510 Encounter for antineoplastic radiation therapy: Secondary | ICD-10-CM | POA: Diagnosis not present

## 2015-01-23 ENCOUNTER — Ambulatory Visit
Admission: RE | Admit: 2015-01-23 | Discharge: 2015-01-23 | Disposition: A | Discharge status: Home / Self Care | Payer: BLUE CROSS/BLUE SHIELD | Source: Ambulatory Visit | Attending: Radiation Oncology | Admitting: Radiation Oncology

## 2015-01-23 DIAGNOSIS — Z510 Encounter for antineoplastic radiation therapy: Secondary | ICD-10-CM | POA: Diagnosis not present

## 2015-01-24 ENCOUNTER — Ambulatory Visit
Admission: RE | Admit: 2015-01-24 | Discharge: 2015-01-24 | Disposition: A | Discharge status: Home / Self Care | Payer: BLUE CROSS/BLUE SHIELD | Source: Ambulatory Visit | Attending: Radiation Oncology | Admitting: Radiation Oncology

## 2015-01-24 DIAGNOSIS — Z510 Encounter for antineoplastic radiation therapy: Secondary | ICD-10-CM | POA: Diagnosis not present

## 2015-01-25 ENCOUNTER — Ambulatory Visit
Admission: RE | Admit: 2015-01-25 | Discharge: 2015-01-25 | Disposition: A | Discharge status: Home / Self Care | Payer: BLUE CROSS/BLUE SHIELD | Source: Ambulatory Visit | Attending: Radiation Oncology | Admitting: Radiation Oncology

## 2015-01-25 DIAGNOSIS — Z510 Encounter for antineoplastic radiation therapy: Secondary | ICD-10-CM | POA: Diagnosis not present

## 2015-01-28 ENCOUNTER — Ambulatory Visit
Admission: RE | Admit: 2015-01-28 | Discharge: 2015-01-28 | Disposition: A | Discharge status: Home / Self Care | Payer: BLUE CROSS/BLUE SHIELD | Source: Ambulatory Visit | Attending: Radiation Oncology | Admitting: Radiation Oncology

## 2015-01-28 ENCOUNTER — Encounter: Payer: Self-pay | Admitting: Radiation Oncology

## 2015-01-28 VITALS — BP 143/86 | HR 82 | Temp 97.6°F | Ht 74.0 in | Wt 340.4 lb

## 2015-01-28 DIAGNOSIS — Z510 Encounter for antineoplastic radiation therapy: Secondary | ICD-10-CM | POA: Diagnosis not present

## 2015-01-28 DIAGNOSIS — D481 Neoplasm of uncertain behavior of connective and other soft tissue: Secondary | ICD-10-CM

## 2015-01-28 MED ORDER — HYDROCODONE-ACETAMINOPHEN 5-325 MG PO TABS: 2.0000 | ORAL_TABLET | Freq: Four times a day (QID) | ORAL | Status: DC | PRN

## 2015-01-28 NOTE — Progress Notes (Signed)
[name] presents for his 11th fraction to his left posterior thigh. He reports his pain at a 6 in his left leg intermittently and when he is walking. He reports some fatigue, and does nap frequently. His skin is slightly darker and sore per the patient. He also reports he feels some swelling in that Left posterior thigh. He reports he has a dentist appointment for a regular cleaning, and needs to know if he should go, or wait until radiation completes.

## 2015-01-28 NOTE — Progress Notes (Addendum)
Weekly Management Note:  Site: Left proximal lower extremity Current Dose:  2200  cGy Projected Dose: 5600  cGy  Narrative: The patient is seen today for routine under treatment assessment. CBCT/MVCT images/port films were reviewed. The chart was reviewed.   The setup is excellent with a 3 mm shift.  He does have persistent discomfort along his left posterior thigh.  He has been taking morphine but he would like to change to hydrocodone.  Hydrocodone is listed as an allergy, but he really only has occasional nausea/vomiting and not truly and allergy.  He's had hydrocodone in the past with good results.  He will stop his morphine.  He was given Biafine cream to use as needed.  Physical Examination:  Filed Vitals:   01/28/15 1152  BP: 143/86  Pulse: 82  Temp: 97.6 F (36.4 C)  .  Weight: 340 lb 6.4 oz (154.404 kg).  There are no significant skin changes.  I examined his left posterior thigh.  Impression: Tolerating radiation therapy well.  I would prescription for 60 tablets of hydrocodone/APAP (Norco).  Plan: Continue radiation therapy as planned.

## 2015-01-29 ENCOUNTER — Ambulatory Visit
Admission: RE | Admit: 2015-01-29 | Discharge: 2015-01-29 | Disposition: A | Discharge status: Home / Self Care | Payer: BLUE CROSS/BLUE SHIELD | Source: Ambulatory Visit | Attending: Radiation Oncology | Admitting: Radiation Oncology

## 2015-01-29 DIAGNOSIS — Z510 Encounter for antineoplastic radiation therapy: Secondary | ICD-10-CM | POA: Diagnosis not present

## 2015-01-30 ENCOUNTER — Ambulatory Visit
Admission: RE | Admit: 2015-01-30 | Discharge: 2015-01-30 | Disposition: A | Discharge status: Home / Self Care | Payer: BLUE CROSS/BLUE SHIELD | Source: Ambulatory Visit | Attending: Radiation Oncology | Admitting: Radiation Oncology

## 2015-01-30 DIAGNOSIS — Z510 Encounter for antineoplastic radiation therapy: Secondary | ICD-10-CM | POA: Diagnosis not present

## 2015-01-31 ENCOUNTER — Ambulatory Visit
Admission: RE | Admit: 2015-01-31 | Discharge: 2015-01-31 | Disposition: A | Discharge status: Home / Self Care | Payer: BLUE CROSS/BLUE SHIELD | Source: Ambulatory Visit | Attending: Radiation Oncology | Admitting: Radiation Oncology

## 2015-01-31 DIAGNOSIS — Z510 Encounter for antineoplastic radiation therapy: Secondary | ICD-10-CM | POA: Diagnosis not present

## 2015-02-01 ENCOUNTER — Ambulatory Visit
Admission: RE | Admit: 2015-02-01 | Discharge: 2015-02-01 | Disposition: A | Discharge status: Home / Self Care | Payer: BLUE CROSS/BLUE SHIELD | Source: Ambulatory Visit | Attending: Radiation Oncology | Admitting: Radiation Oncology

## 2015-02-01 DIAGNOSIS — Z510 Encounter for antineoplastic radiation therapy: Secondary | ICD-10-CM | POA: Diagnosis not present

## 2015-02-04 ENCOUNTER — Encounter: Payer: Self-pay | Admitting: Radiation Oncology

## 2015-02-04 ENCOUNTER — Ambulatory Visit
Admission: RE | Admit: 2015-02-04 | Discharge: 2015-02-04 | Disposition: A | Discharge status: Home / Self Care | Payer: BLUE CROSS/BLUE SHIELD | Source: Ambulatory Visit | Attending: Radiation Oncology | Admitting: Radiation Oncology

## 2015-02-04 VITALS — BP 123/78 | HR 86 | Temp 97.6°F | Ht 74.0 in | Wt 340.6 lb

## 2015-02-04 DIAGNOSIS — Z510 Encounter for antineoplastic radiation therapy: Secondary | ICD-10-CM | POA: Diagnosis not present

## 2015-02-04 DIAGNOSIS — C4490 Unspecified malignant neoplasm of skin, unspecified: Secondary | ICD-10-CM

## 2015-02-04 DIAGNOSIS — D481 Neoplasm of uncertain behavior of connective and other soft tissue: Secondary | ICD-10-CM

## 2015-02-04 MED ORDER — BIAFINE EX EMUL
Freq: Two times a day (BID) | CUTANEOUS | Status: DC
  Administered 2015-02-04: 19:00:00 via TOPICAL

## 2015-02-04 NOTE — Addendum Note (Signed)
Encounter addended by: Ernst Spell, RN on: 02/04/2015  7:04 PM<BR>     Documentation filed: Visit Diagnoses, Orders, Dx Association, Inpatient [facility]

## 2015-02-04 NOTE — Progress Notes (Signed)
[name] has received 16 fractions to his left posterior thigh.  States pain as a level 4/10 in his tx field with bloody stools this weekend after taking a laxative.

## 2015-02-04 NOTE — Progress Notes (Signed)
Weekly Management Note:  Site: left posterior thigh Current Dose:   3200  cGy Projected Dose:  5600  cGy  Narrative: The patient is seen today for routine under treatment assessment. CBCT/MVCT images/port films were reviewed. The chart was reviewed.    He is still doing well although he did have some rectal bleeding over the weekend. This was associated with constipation ,taking a laxative, and straining with his bowel movements.  He takes up to 2 hydrocodone tablets a day and this probably precipitated his recent constipation. His  radiation therapy field is not deliver a significant dose to the rectum.   Physical Examination:  Filed Vitals:   02/04/15 1121  BP: 123/78  Pulse: 86  Temp: 97.6 F (36.4 C)  .  Weight: 340 lb 9.6 oz (154.495 kg).  On inspection of the anal margin there are no significant skin changes.  There are no significant skin changes along his posterior thigh /down to the popliteal region.  Impression: Tolerating radiation therapy well.  Plan: Continue radiation therapy as planned.

## 2015-02-05 ENCOUNTER — Ambulatory Visit
Admission: RE | Admit: 2015-02-05 | Discharge: 2015-02-05 | Disposition: A | Discharge status: Home / Self Care | Payer: BLUE CROSS/BLUE SHIELD | Source: Ambulatory Visit | Attending: Radiation Oncology | Admitting: Radiation Oncology

## 2015-02-05 ENCOUNTER — Encounter: Payer: Self-pay | Admitting: Radiation Oncology

## 2015-02-05 DIAGNOSIS — Z510 Encounter for antineoplastic radiation therapy: Secondary | ICD-10-CM | POA: Diagnosis not present

## 2015-02-05 NOTE — Progress Notes (Signed)
CC: Dr. Darcus Austin  [name] it tells [place] he is been having more rectal bleeding with pain on defecation.  He tells me he feels as if he "have a cut" within the anorectum.  He is not on aspirin or blood thinners.  His constipation, apparently precipitated by hydrocodone, is somewhat improved on stool softeners.  He will see Dr. Darcus Austin tomorrow.  I wonder if he has development of an anal fissure or internal hemorrhoids.  The amount of radiation going to his rectum is quite low and I would not expect for him to have any radiation proctitis.

## 2015-02-06 ENCOUNTER — Ambulatory Visit
Admission: RE | Admit: 2015-02-06 | Discharge: 2015-02-06 | Disposition: A | Discharge status: Home / Self Care | Payer: BLUE CROSS/BLUE SHIELD | Source: Ambulatory Visit | Attending: Radiation Oncology | Admitting: Radiation Oncology

## 2015-02-06 DIAGNOSIS — Z510 Encounter for antineoplastic radiation therapy: Secondary | ICD-10-CM | POA: Diagnosis not present

## 2015-02-07 ENCOUNTER — Ambulatory Visit
Admission: RE | Admit: 2015-02-07 | Discharge: 2015-02-07 | Disposition: A | Discharge status: Home / Self Care | Payer: BLUE CROSS/BLUE SHIELD | Source: Ambulatory Visit | Attending: Radiation Oncology | Admitting: Radiation Oncology

## 2015-02-07 ENCOUNTER — Ambulatory Visit: Payer: BLUE CROSS/BLUE SHIELD

## 2015-02-07 DIAGNOSIS — Z510 Encounter for antineoplastic radiation therapy: Secondary | ICD-10-CM | POA: Diagnosis not present

## 2015-02-08 ENCOUNTER — Ambulatory Visit
Admission: RE | Admit: 2015-02-08 | Discharge: 2015-02-08 | Disposition: A | Discharge status: Home / Self Care | Payer: BLUE CROSS/BLUE SHIELD | Source: Ambulatory Visit | Attending: Radiation Oncology | Admitting: Radiation Oncology

## 2015-02-08 ENCOUNTER — Ambulatory Visit: Payer: BLUE CROSS/BLUE SHIELD

## 2015-02-08 DIAGNOSIS — Z510 Encounter for antineoplastic radiation therapy: Secondary | ICD-10-CM | POA: Diagnosis not present

## 2015-02-11 ENCOUNTER — Ambulatory Visit
Admission: RE | Admit: 2015-02-11 | Discharge: 2015-02-11 | Disposition: A | Discharge status: Home / Self Care | Payer: BLUE CROSS/BLUE SHIELD | Source: Ambulatory Visit | Attending: Radiation Oncology | Admitting: Radiation Oncology

## 2015-02-11 ENCOUNTER — Ambulatory Visit: Payer: BLUE CROSS/BLUE SHIELD

## 2015-02-11 ENCOUNTER — Encounter: Payer: Self-pay | Admitting: Radiation Oncology

## 2015-02-11 VITALS — BP 148/88 | HR 79 | Temp 97.8°F | Ht 74.0 in | Wt 339.4 lb

## 2015-02-11 DIAGNOSIS — D481 Neoplasm of uncertain behavior of connective and other soft tissue: Secondary | ICD-10-CM

## 2015-02-11 DIAGNOSIS — Z510 Encounter for antineoplastic radiation therapy: Secondary | ICD-10-CM | POA: Diagnosis not present

## 2015-02-11 NOTE — Progress Notes (Signed)
[name] is here for his 21/28 fraction to his left posterior thigh. He reports fatigue, but is still able to work from home.  He reports some rectal bleeding, starting with wiping and having some on his tissue. Today he reports his stools have some blood in them also. He relates it to being constipated and has been using miralax, and colace to help have bowel movements. He reports the pain in his rectum is a 9, and has spoken to his primary MD about this and she examed him and is following him as needed, and he states she believes it might be broken skin, no hemmoroids noted. The skin on the back of his thigh is tender, tough per patient, and the skin is slightly dry, and darker in color on my exam.  BP 148/88 mmHg  Pulse 79  Temp(Src) 97.8 F (36.6 C)  Ht 6\' 2"  (1.88 m)  Wt 339 lb 6.4 oz (153.951 kg)  BMI 43.56 kg/m2   Wt Readings from Last 3 Encounters:  02/11/15 339 lb 6.4 oz (153.951 kg)  02/04/15 340 lb 9.6 oz (154.495 kg)  01/28/15 340 lb 6.4 oz (154.404 kg)

## 2015-02-11 NOTE — Progress Notes (Signed)
Weekly Management Note:  Site: Left posterior thigh Current Dose:  4200  cGy Projected Dose: 5600  cGy  Narrative: The patient is seen today for routine under treatment assessment. CBCT/MVCT images/port films were reviewed. The chart was reviewed.   His treatment setup remains excellent.  He still has rectal pain and felt to be secondary to a fissure.  We believe that this was precipitated by constipation secondary to hydrocodone use.  He's had intermittent rectal bleeding.  He was seen by his primary care physician, Dr. Darcus Austin.  He is using sitz baths area  Physical Examination:  Filed Vitals:   02/11/15 1208  BP: 148/88  Pulse: 79  Temp: 97.8 F (36.6 C)  .  Weight: 339 lb 6.4 oz (153.951 kg).  There is hyperpigmentation skin along the posterior thigh with no areas of desquamation.  Impression: Tolerating radiation therapy well.  Plan: Continue radiation therapy as planned.

## 2015-02-12 ENCOUNTER — Ambulatory Visit
Admission: RE | Admit: 2015-02-12 | Discharge: 2015-02-12 | Disposition: A | Discharge status: Home / Self Care | Payer: BLUE CROSS/BLUE SHIELD | Source: Ambulatory Visit | Attending: Radiation Oncology | Admitting: Radiation Oncology

## 2015-02-12 DIAGNOSIS — Z510 Encounter for antineoplastic radiation therapy: Secondary | ICD-10-CM | POA: Diagnosis not present

## 2015-02-13 ENCOUNTER — Ambulatory Visit: Payer: BLUE CROSS/BLUE SHIELD | Admitting: Radiation Oncology

## 2015-02-13 ENCOUNTER — Ambulatory Visit
Admission: RE | Admit: 2015-02-13 | Discharge: 2015-02-13 | Disposition: A | Discharge status: Home / Self Care | Payer: BLUE CROSS/BLUE SHIELD | Source: Ambulatory Visit | Attending: Radiation Oncology | Admitting: Radiation Oncology

## 2015-02-13 ENCOUNTER — Ambulatory Visit: Payer: BLUE CROSS/BLUE SHIELD

## 2015-02-13 DIAGNOSIS — Z510 Encounter for antineoplastic radiation therapy: Secondary | ICD-10-CM | POA: Diagnosis not present

## 2015-02-14 ENCOUNTER — Ambulatory Visit: Payer: BLUE CROSS/BLUE SHIELD

## 2015-02-14 ENCOUNTER — Ambulatory Visit
Admission: RE | Admit: 2015-02-14 | Discharge: 2015-02-14 | Disposition: A | Discharge status: Home / Self Care | Payer: BLUE CROSS/BLUE SHIELD | Source: Ambulatory Visit | Attending: Radiation Oncology | Admitting: Radiation Oncology

## 2015-02-14 DIAGNOSIS — Z510 Encounter for antineoplastic radiation therapy: Secondary | ICD-10-CM | POA: Diagnosis not present

## 2015-02-15 ENCOUNTER — Ambulatory Visit
Admission: RE | Admit: 2015-02-15 | Discharge: 2015-02-15 | Disposition: A | Discharge status: Home / Self Care | Payer: BLUE CROSS/BLUE SHIELD | Source: Ambulatory Visit | Attending: Radiation Oncology | Admitting: Radiation Oncology

## 2015-02-15 DIAGNOSIS — Z510 Encounter for antineoplastic radiation therapy: Secondary | ICD-10-CM | POA: Diagnosis not present

## 2015-02-18 ENCOUNTER — Ambulatory Visit
Admission: RE | Admit: 2015-02-18 | Discharge: 2015-02-18 | Disposition: A | Discharge status: Home / Self Care | Payer: BLUE CROSS/BLUE SHIELD | Source: Ambulatory Visit | Attending: Radiation Oncology | Admitting: Radiation Oncology

## 2015-02-18 ENCOUNTER — Encounter: Payer: Self-pay | Admitting: Radiation Oncology

## 2015-02-18 VITALS — BP 135/84 | HR 79 | Temp 98.1°F | Ht 74.0 in | Wt 337.7 lb

## 2015-02-18 DIAGNOSIS — Z510 Encounter for antineoplastic radiation therapy: Secondary | ICD-10-CM | POA: Diagnosis not present

## 2015-02-18 DIAGNOSIS — D481 Neoplasm of uncertain behavior of connective and other soft tissue: Secondary | ICD-10-CM

## 2015-02-18 NOTE — Progress Notes (Signed)
[name] is here for his 26/28 fraction to his Left Posterior Thigh. He reports being tired, without a whole lot of energy. He states he slept a lot this weekend because he felt "wiped out". He is able to complete his normal daily activities. His Right Posterior leg is hyperpigmented, and he reports it is tender.  He is using the biafine cream as directed. He using a cane to assist with ambulation, which he reports he has been using.  BP 135/84 mmHg  Pulse 79  Temp(Src) 98.1 F (36.7 C)  Ht 6\' 2"  (1.88 m)  Wt 337 lb 11.2 oz (153.18 kg)  BMI 43.34 kg/m2   Wt Readings from Last 3 Encounters:  02/18/15 337 lb 11.2 oz (153.18 kg)  02/11/15 339 lb 6.4 oz (153.951 kg)  02/04/15 340 lb 9.6 oz (154.495 kg)

## 2015-02-18 NOTE — Progress Notes (Signed)
Weekly Management Note:  Site: Left posterior thigh Current Dose:  5200  cGy Projected Dose: 5600  cGy  Narrative: The patient is seen today for routine under treatment assessment. CBCT/MVCT images/port films were reviewed. The chart was reviewed.   His major complaint is that of rectal discomfort secondary to a suspected anal fissure secondary to constipation from use of hydrocodone for thigh pain.  He remains off hydrocodone because of the risk for constipation.  He uses Biafine cream along his posterior thigh.  His other complaint is that of moderate to severe fatigue.  Physical Examination:  Filed Vitals:   02/18/15 1227  BP: 135/84  Pulse: 79  Temp: 98.1 F (36.7 C)  .  Weight: 337 lb 11.2 oz (153.18 kg).  There is hyperpigmentation the skin along the left posterior thigh but no areas of desquamation.  He denies desquamation along his left groin.  Impression: Tolerating radiation therapy well except for pain felt to be secondary to an anal fissure.  He tells me he has seen Dr. Johnathan Hausen the past, and he would like to see him again for further evaluation and management of his suspected anal fissure.  I again explained to him that the dose going to his anorectum is quite low and would not be expected to cause any problems.  He will finish his radiation therapy this Wednesday.  Plan: Continue radiation therapy as planned.  He will try to visit Dr. Hassell Done.  He will finish his treatment this Wednesday and then see me for a follow-up visit in one month.

## 2015-02-19 ENCOUNTER — Ambulatory Visit
Admission: RE | Admit: 2015-02-19 | Discharge: 2015-02-19 | Disposition: A | Discharge status: Home / Self Care | Payer: BLUE CROSS/BLUE SHIELD | Source: Ambulatory Visit | Attending: Radiation Oncology | Admitting: Radiation Oncology

## 2015-02-19 DIAGNOSIS — Z510 Encounter for antineoplastic radiation therapy: Secondary | ICD-10-CM | POA: Diagnosis not present

## 2015-02-20 ENCOUNTER — Ambulatory Visit
Admission: RE | Admit: 2015-02-20 | Discharge: 2015-02-20 | Disposition: A | Discharge status: Home / Self Care | Payer: BLUE CROSS/BLUE SHIELD | Source: Ambulatory Visit | Attending: Radiation Oncology | Admitting: Radiation Oncology

## 2015-02-20 ENCOUNTER — Ambulatory Visit: Payer: BLUE CROSS/BLUE SHIELD

## 2015-02-20 ENCOUNTER — Encounter: Payer: Self-pay | Admitting: Radiation Oncology

## 2015-02-20 DIAGNOSIS — Z510 Encounter for antineoplastic radiation therapy: Secondary | ICD-10-CM | POA: Diagnosis not present

## 2015-02-20 NOTE — Progress Notes (Signed)
[facility] End of Treatment Note  Name:Nathaniel Chambers  Date: 02/20/2015 NMM:[id number] DOB:[birth year]   Status:outpatient    CC: [name], MD  , Dr. Gwyndolyn Saxon Ward  REFERRING PHYSICIAN:  Dr. 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  Initiation of his treatment was delayed by approximately 3 weeks because of his insurance carrier requiring an  "expedited" appeal of initial denial of IMRT treatment.  He tolerated treatment well, but he may have developed an anal fissure secondary to constipation as a result of hydrocodone/APAP 2 help him with his thigh pain.  He had marked hyperpigmentation of the skin along his posterior thigh with patchy dry desquamation but no areas of moist desquamation on completion of therapy.                    PLAN: Routine followup in one month. Patient instructed to call if questions or worsening complaints in interim.

## 2015-02-21 ENCOUNTER — Ambulatory Visit
Admission: RE | Admit: 2015-02-21 | Discharge: 2015-02-21 | Disposition: A | Discharge status: Home / Self Care | Payer: BLUE CROSS/BLUE SHIELD | Source: Ambulatory Visit | Attending: Radiation Oncology | Admitting: Radiation Oncology

## 2015-02-21 ENCOUNTER — Ambulatory Visit: Payer: BLUE CROSS/BLUE SHIELD

## 2015-02-21 DIAGNOSIS — D481 Neoplasm of uncertain behavior of connective and other soft tissue: Secondary | ICD-10-CM

## 2015-02-21 DIAGNOSIS — Z9049 Acquired absence of other specified parts of digestive tract: Secondary | ICD-10-CM

## 2015-02-21 DIAGNOSIS — Z510 Encounter for antineoplastic radiation therapy: Secondary | ICD-10-CM | POA: Diagnosis not present

## 2015-03-13 MED ORDER — SONAFINE EX EMUL
1.0000 "application " | Freq: Two times a day (BID) | CUTANEOUS | Status: DC
  Administered 2015-03-13: 1 via TOPICAL
  Filled 2015-03-13: qty 45

## 2015-03-19 ENCOUNTER — Encounter: Payer: Self-pay | Admitting: Radiation Oncology

## 2015-03-20 ENCOUNTER — Encounter: Payer: Self-pay | Admitting: Radiation Oncology

## 2015-03-20 ENCOUNTER — Ambulatory Visit
Admission: RE | Admit: 2015-03-20 | Discharge: 2015-03-20 | Disposition: A | Discharge status: Home / Self Care | Payer: BLUE CROSS/BLUE SHIELD | Source: Ambulatory Visit | Attending: Radiation Oncology | Admitting: Radiation Oncology

## 2015-03-20 VITALS — BP 132/76 | HR 86 | Temp 98.0°F | Ht 74.0 in | Wt 345.0 lb

## 2015-03-20 DIAGNOSIS — D481 Neoplasm of uncertain behavior of connective and other soft tissue: Secondary | ICD-10-CM

## 2015-03-20 HISTORY — DX: Personal history of irradiation: Z92.3

## 2015-03-20 NOTE — Progress Notes (Signed)
CC: Dr. Darcus Austin, Dr. Gwyndolyn Saxon Ward  Follow-up note:  Nathaniel Chambers returns today approximately 1 month following completion of postoperative radiation therapy in the management of his desmoid tumor/aggressive fibromatosis involving his left posterior thigh.  Following radiation therapy he had symptomatic dermatitis with dry desquamation of the skin but this has improved.  He continues to have left posterior thigh discomfort for which he takes an occasional hydrocodone tablet.  At times his pain is severe as 7-8/10.  Following his treatment he did see Nathaniel Chambers of general surgery who confirmed that he had an anal fissure, felt to be secondary to constipation, which has since healed with steroid cream.  He takes MiraLAX to keep his stool soft.  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  This began postoperatively.  His radiation related fatigue is improved.  He is now unemployed and is looking for work recognizing that he does have the physical limitations.  Physical examination: Alert and oriented. Filed Vitals:   03/20/15 1040  BP: 132/76  Pulse: 86  Temp: 98 F (36.7 C)   On inspection of the left thigh there is residual hyperpigmentation with dry desquamation along the posterior medial thigh extending from the buttock down to his popliteal fossa.  There is minimal fibrosis and I do not feel any discrete masses on deep palpation.  He has good range of motion of his hip and knee.  Neurologic examination is grossly unchanged.  Impression: Clinically stable.  Nathaniel Chambers tells me he will probably have a MRI scan in February and then a follow-up visit with Nathaniel Chambers in March.  I would like for him to return here in April to see Nathaniel Chambers who will be assigned to Nathaniel Chambers with my retirement.  Plan: As above.

## 2015-03-20 NOTE — Progress Notes (Signed)
Nathaniel Chambers here for F/U visit no new medical concerns todays.    Using the Sonafine to left thigh everyday.  Reports a low energy level.  BP 132/76 mmHg  Pulse 86  Temp(Src) 98 F (36.7 C) (Oral)  Ht 6\' 2"  (1.88 m)  Wt 345 lb (156.491 kg)  BMI 44.28 kg/m2  SpO2 95%  Wt Readings from Last 3 Encounters:  03/20/15 345 lb (156.491 kg)  02/18/15 337 lb 11.2 oz (153.18 kg)  02/11/15 339 lb 6.4 oz (153.951 kg)

## 2015-07-12 ENCOUNTER — Encounter: Payer: Self-pay | Admitting: Neurology

## 2015-07-12 ENCOUNTER — Ambulatory Visit (INDEPENDENT_AMBULATORY_CARE_PROVIDER_SITE_OTHER): Payer: Commercial Managed Care - PPO | Admitting: Neurology

## 2015-07-12 VITALS — BP 120/84 | HR 86 | Ht 74.0 in | Wt 352.1 lb

## 2015-07-12 DIAGNOSIS — G5712 Meralgia paresthetica, left lower limb: Secondary | ICD-10-CM

## 2015-07-12 DIAGNOSIS — G5711 Meralgia paresthetica, right lower limb: Secondary | ICD-10-CM | POA: Diagnosis not present

## 2015-07-12 DIAGNOSIS — M792 Neuralgia and neuritis, unspecified: Secondary | ICD-10-CM | POA: Diagnosis not present

## 2015-07-12 MED ORDER — GABAPENTIN 600 MG PO TABS: 1200.0000 mg | ORAL_TABLET | Freq: Every day | ORAL | Status: DC

## 2015-07-12 MED ORDER — GABAPENTIN 300 MG PO CAPS: ORAL_CAPSULE | ORAL | Status: DC

## 2015-07-12 NOTE — Progress Notes (Signed)
Follow-up Visit   Date: 07/12/2015    NORIS HOHMAN MRN: [id number] DOB: 20-Sep-1965   Interim History: Nathaniel Chambers is a 50 y.o. 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      History of present illness: Since 2014, he started feeling an abnormality behind his thigh and was eventually found to have found to have a mass involving the posterior thigh. 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 Gwyndolyn Saxon Ward in [place]. A few days following surgery, he developed numbness involving the RIGHT anterolateral thigh. Symptoms have worsened over the past several months and now he has extreme sensitivity with sharp pain involving the left thigh. Light pressure, air, and even bed sheets causes severe pain. He tried on amitriptyline and Lyrica which did not help pain, but made him sleepy. He was taking ibuprofen 600mg  following surgery for a brief time, but since stopping it, he feels that pain may have worsened. He also reports having sharp pain involving the left thigh around mid-May. Prior to surgery, he had no numbness/tingling of the thighs. He has gained 30lb following surgery.   UPDATE 03/30/2014:  Since starting gabapentin 600mg  qhs, he noticed mild improvement of pain but he reports getting very lightheaded with it so is only taking it at night.  From an oncology stand-point, he was recently re-evaluated and there has been no reoccurrence.  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  He is staying very active and trying to loose weight.  Aspercream helps more than lidocaine ointment.  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  He is getting relief with gabapentin 300mg  twice daily.  He has lost 7lb since his last visit.  No new complaints.    UPDATE 07/12/2015:  Since his last visit, he underwent colon resection for diverticulitis, recurrence of his desmoid tumor in the left thigh which he has resected in June followed radiation therapy.  In November, his employer of 14 years moved the company elsewhere and he lost his job.  Needless to say, he has had a rough 2016.   Around August 2016, he started increasing his gabapentin because of worsening painful paresthesias of the legs.  He is currently taking gabapentin 1200mg  at bedtime.  His symptoms are well controlled at nighttime, but by 8am they start to bother him again.  He has some left leg weakness and gait instability due to scar tissue.    Medications:  Current Outpatient Prescriptions on File Prior to Visit  Medication Sig Dispense Refill  . albuterol (PROVENTIL HFA;VENTOLIN HFA) 108 (90 BASE) MCG/ACT inhaler Inhale 2 puffs into the lungs every 6 (six) hours as needed for wheezing or shortness of breath (wheezing).     . clotrimazole-betamethasone (LOTRISONE) cream Apply 1 application topically 2 (two) times daily as needed (rash).   0  . docusate sodium (COLACE) 100 MG capsule Take 100 mg by mouth 3 (three) times daily as needed for mild constipation.    Marland Kitchen emollient (BIAFINE) cream 1 application 2 (two) times daily.    . Fluticasone-Salmeterol (ADVAIR) 250-50 MCG/DOSE AEPB Inhale 1 puff into the lungs 2 (two) times daily.     Marland Kitchen  HYDROcodone-acetaminophen (NORCO) 5-325 MG tablet Take 2 tablets by mouth every 6 (six) hours as needed for moderate pain. 60 tablet 0  . polyethylene glycol (MIRALAX / GLYCOLAX) packet Take 17 g by mouth daily. 14 each 0  . tadalafil (CIALIS) 10 MG tablet Take 10 mg by mouth daily as needed for erectile dysfunction (erectile dysfunction).     . Vitamin D, Ergocalciferol,  (DRISDOL) 50000 UNITS CAPS capsule Take 50,000 Units by mouth every 7 (seven) days. Days of week vary.     No current facility-administered medications on file prior to visit.    Allergies:  Allergies  Allergen Reactions  . Methylprednisolone Other (See Comments)  . Penicillins Shortness Of Breath    Swelling of uvula; tolerates augmentin  . Solu-Medrol [Methylprednisolone Acetate] Anaphylaxis  . Dilaudid [Hydromorphone Hcl] Nausea And Vomiting  . Oxycodone Nausea And Vomiting    Takes phenergan to alleviate symptoms  . Percocet [Oxycodone-Acetaminophen] Nausea And Vomiting    Review of Systems:  CONSTITUTIONAL: No fevers, chills, night sweats, or weight loss.  EYES: No visual changes or eye pain ENT: No hearing changes.  No history of nose bleeds.   RESPIRATORY: No cough, wheezing and shortness of breath.   CARDIOVASCULAR: Negative for chest pain, and palpitations.   GI: Negative for abdominal discomfort, blood in stools or black stools.  No recent change in bowel habits.   GU:  No history of incontinence.   MUSCLOSKELETAL: No history of joint pain or swelling.  No myalgias.   SKIN: Negative for lesions, rash, and itching.   ENDOCRINE: Negative for cold or heat intolerance, polydipsia or goiter.   PSYCH:  No depression or anxiety symptoms.   NEURO: As Above.   Vital Signs:  BP 120/84 mmHg  Pulse 86  Ht 6\' 2"  (1.88 m)  Wt 352 lb 1 oz (159.695 kg)  BMI 45.18 kg/m2  SpO2 93%  Neurological Exam: MENTAL STATUS including orientation to time, place, and person is normal.  Speech is not dysarthric.  CRANIAL NERVES: Pupils equal round and reactive to light.  No ptosis.  Face is symmetric. Palate elevates symmetrically.  MOTOR:  Motor strength is 5/5 in all extremities, except LLE 5-/5.  Tone is normal.    MSRs:  Reflexes are 2+/4 throughout, except 1+ Achilles bilaterally.  SENSORY: Diminished sensation to temperature and light touch over the anterolateral thigh (R >L),  .  COORDINATION/GAIT:    Gait narrow based and stable.   Data: CT abdomen pelvis with contrast 09/15/2013: Acute sigmoid diverticulitis.  No drainable fluid collection/abscess. No free air.  MRA head 10/31/2008: 1. Possible moderate 50% stenosis involving the right  vertebrobasilar junction.  2. No occlusions, dissections or aneurysms seen on the study.  MRI brain wo contrast 10/31/2008: 1. No evidence of acute ischemia.  2. Abnormal marrow signal involving the upper one third of the clivus. Differential considerations include marrow changes related  to anemia, smoking, or a neoplastic infiltrative process. Clinical correlation and further evaluation with a dedicated CT scan through the cranial skull base is suggested.  3. Moderate inflammatory thickening of the mucosa in the ethmoids and the maxillary sinuses.   IMPRESSION/PLAN: 1.  Bilateral meralgia paresthetica with persistent neuropathic pain  - Previously tried:  Lyrica, amitriptyline, lidocaine    - Increase neurontin to 600mg  in the morning and 1200mg  at bedtime.    - Call in 2 weeks to determine further titration  - Consider nortriptyline going forward  2.  Return to clinic in 2 months  The duration of this appointment visit was 30 minutes of face-to-face time with the patient.  Greater than 50% of this time was spent in counseling, explanation of diagnosis, planning of further management, and coordination of care.   Thank you for allowing me to participate in patient's care.  If I can answer any additional questions, I would be pleased to do so.    Sincerely,    Donika K. Posey Pronto, DO

## 2015-07-12 NOTE — Patient Instructions (Addendum)
1.  Increase gabapentin as follows:  Day 1-3:  Morning: 300 mg        Bedtime: 1200 mg  Day 4-6:  Morning: 600 mg         Bedtime: 1200 mg  Day 7-9:  Morning: 600 mg    Noon: 300 mg    Bedtime: 1200 mg  Call with update in 2 weeks, to determine further increase in medication.  If you develop increased sleepiness, stay at the lower dose.           Return to clinic 2 months

## 2015-07-16 ENCOUNTER — Telehealth: Payer: Self-pay | Admitting: Neurology

## 2015-07-17 ENCOUNTER — Telehealth: Payer: Self-pay | Admitting: *Deleted

## 2015-07-17 NOTE — Telephone Encounter (Signed)
Call the pt wife to let her know that medical records are ready to be pick up they are at [facility],

## 2015-07-24 ENCOUNTER — Telehealth: Payer: Self-pay | Admitting: Neurology

## 2015-07-24 NOTE — Telephone Encounter (Signed)
PT called and wanted to know if Dr Posey Pronto can prescribe a cream for pain, he has the name of the cream/Dawn Please call wife Andee Poles at [phone]

## 2015-07-25 MED ORDER — DICLOFENAC SODIUM 1 % TD GEL: 2.0000 g | Freq: Three times a day (TID) | TRANSDERMAL | Status: DC | PRN

## 2015-07-25 NOTE — Telephone Encounter (Signed)
Patient would like Rx for Voltaren gel.  Can we order TENS unit.  Patient can't afford PT right now.  Gabapentin is helping with the burning sensation but not the pain.  Please advise.   Call back # [phone]

## 2015-07-25 NOTE — Telephone Encounter (Signed)
TENS units are over the counter, no prescription needed.  Rx sent.  Maurya Nethery K. Posey Pronto, DO

## 2015-08-08 ENCOUNTER — Ambulatory Visit
Admission: RE | Admit: 2015-08-08 | Discharge: 2015-08-08 | Disposition: A | Discharge status: Home / Self Care | Payer: BLUE CROSS/BLUE SHIELD | Source: Ambulatory Visit | Attending: Radiation Oncology | Admitting: Radiation Oncology

## 2015-08-14 NOTE — Progress Notes (Signed)
Nathaniel Chambers is here for a follow up visit for Desmoid Fibromatosis of left posterior thigh.  Skin:Left posterior thigh: Hard to touch smooth no signs if infections.  Applying coco butter lotion to his leg everyday. Pain: Bilateral meraigia paresthetica with persistent neuropathic pain taking Neurontin,Aspercream, out of his hydrocodone Mobility:Using a cane to walk as needed, does not walk a very long distance. To see surgeon next Wednesday 08-21-15

## 2015-08-15 ENCOUNTER — Encounter: Payer: Self-pay | Admitting: Radiation Oncology

## 2015-08-15 ENCOUNTER — Ambulatory Visit
Admission: RE | Admit: 2015-08-15 | Discharge: 2015-08-15 | Disposition: A | Discharge status: Home / Self Care | Payer: Commercial Managed Care - PPO | Source: Ambulatory Visit | Attending: Radiation Oncology | Admitting: Radiation Oncology

## 2015-08-15 ENCOUNTER — Ambulatory Visit: Payer: Self-pay | Admitting: Radiation Oncology

## 2015-08-15 ENCOUNTER — Ambulatory Visit
Admission: RE | Admit: 2015-08-15 | Discharge: 2015-08-15 | Disposition: A | Discharge status: Home / Self Care | Payer: BLUE CROSS/BLUE SHIELD | Source: Ambulatory Visit | Attending: Radiation Oncology | Admitting: Radiation Oncology

## 2015-08-15 ENCOUNTER — Ambulatory Visit: Payer: Commercial Managed Care - PPO | Admitting: Radiation Oncology

## 2015-08-15 ENCOUNTER — Telehealth: Payer: Self-pay | Admitting: *Deleted

## 2015-08-15 VITALS — BP 156/88 | HR 78 | Temp 98.0°F | Resp 18 | Ht 74.0 in | Wt 347.0 lb

## 2015-08-15 DIAGNOSIS — D481 Neoplasm of uncertain behavior of connective and other soft tissue: Secondary | ICD-10-CM

## 2015-08-15 MED ORDER — ONDANSETRON HCL 4 MG PO TABS: 4.0000 mg | ORAL_TABLET | Freq: Three times a day (TID) | ORAL | Status: DC | PRN

## 2015-08-15 MED ORDER — HYDROCODONE-ACETAMINOPHEN 5-325 MG PO TABS: 2.0000 | ORAL_TABLET | Freq: Four times a day (QID) | ORAL | Status: DC | PRN

## 2015-08-15 MED ORDER — IBUPROFEN 800 MG PO TABS: 800.0000 mg | ORAL_TABLET | Freq: Every day | ORAL | Status: DC | PRN

## 2015-08-15 NOTE — Telephone Encounter (Signed)
Called patient to inform that his fu visit for today @ 5:40 pm is o.k. Per Dr. Pablo Ledger, lvm for a return call

## 2015-08-15 NOTE — Progress Notes (Signed)
   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  Summary and Interval since last radiation: 6 months. Completed radiation 01/14/2015-02/20/2015 to the left posterior thighs/inferior pelvis 5600 cGy in 28 sessions.  Interval History: Nathaniel Chambers presents today for routine followup. He mentions that the skin on his left posterior thigh is hard to touch smooth with no signs of infection. He is applying cocoa butter lotion to his leg everyday. He has bilateral neuropathic pain and is taking Neurontin, Aspercream, and is out of his hydrocodone. He is using a cane to walk as needed and does not walk a very long distance. He mentions that tramadol is not helping the pain in his leg. He has been taking his wife's ibuprofen with good relief.  He is also using a TENS unit for his back.  He cannot afford the copay for an MRI or physical therapy due to the high deductible on his wife's health plan. He is seeing Dr. Leonides Schanz next week for follow up. He has started a new job in [place] that is mostly sitting at a desk and will not get insurance through this job until September.   Physical Exam:  Filed Vitals:   08/15/15 1753  BP: 156/88  Pulse: 78  Temp: 98 F (36.7 C)  TempSrc: Oral  Resp: 18  Height: 6\' 2"  (1.88 m)  Weight: 347 lb (157.398 kg)  SpO2: 97%  He had 3+ non-pitting edema over his posterior left thigh with 1+ pitting edema of his calf.  IMPRESSION: Nathaniel Chambers is a 50 y.o. male recovering from the acute effects of radiation.  PLAN: We discussed that he should start physical therapy. He is concerned with his insurance not covering the physical therapy. I will ask our physical therapist call him to talk to him about some preventative things he could do. I recommend until he can get into  physical therapy to keep his legs elevated when he is home to help drain the fluid. He is going to check out the physical therapy program at [facility] that is closer to his work and try to save for PT copays. I mentioned that he could wear compression stockings while he is at work to keep the fluid from going to his ankles and he knows this will probably get worse over the summer  He will call in three months to make a follow up appointment with me.    I prescribed him a 90 day supply of 800 mg Ibuprofen and a 90 day supply of hydrocodone and told him to alternate the two to help manage his pain. I prescribed him Zofran for the nausea from his pain medication. I told him the hydrocodone and Zofran together can cause constipation. We also discussed kideny and renal damage with too much ibuprofen and I recommended only 800 mg every other day of the Motrin.    ------------------------------------------------  Thea Silversmith, MD    This document serves as a record of services personally performed by Thea Silversmith, MD. It was created on her behalf by  Lendon Collar, a trained medical scribe. The creation of this record is based on the scribe's personal observations and the provider's statements to them. This document has been checked and approved by the attending provider.

## 2015-08-20 ENCOUNTER — Other Ambulatory Visit: Payer: Self-pay | Admitting: Radiation Oncology

## 2015-08-20 DIAGNOSIS — C4922 Malignant neoplasm of connective and soft tissue of left lower limb, including hip: Secondary | ICD-10-CM

## 2015-08-26 ENCOUNTER — Other Ambulatory Visit: Payer: Self-pay | Admitting: Neurology

## 2015-08-26 NOTE — Telephone Encounter (Signed)
Rx sent 

## 2015-09-09 NOTE — Telephone Encounter (Signed)
Closed

## 2015-09-12 ENCOUNTER — Telehealth: Payer: Self-pay | Admitting: Gynecologic Oncology

## 2015-09-12 NOTE — Telephone Encounter (Signed)
Offered the patient an appt tomorrow at 11:30am with Dr. Rolanda Jay but patient stating he works in [place] from 8-5 and he would need the latest appt possible.  Advised I would give him a call back once I had assessed for Dr. Suzzanne Cloud availability.

## 2015-09-13 ENCOUNTER — Encounter: Payer: Self-pay | Admitting: General Surgery

## 2015-09-13 ENCOUNTER — Ambulatory Visit (HOSPITAL_BASED_OUTPATIENT_CLINIC_OR_DEPARTMENT_OTHER): Payer: Commercial Managed Care - PPO | Admitting: General Surgery

## 2015-09-13 VITALS — BP 163/89 | Temp 98.1°F | Resp 20 | Ht 74.0 in | Wt 348.2 lb

## 2015-09-13 DIAGNOSIS — D481 Neoplasm of uncertain behavior of connective and other soft tissue: Secondary | ICD-10-CM

## 2015-09-13 NOTE — Patient Instructions (Signed)
Our office with reach out to the Lymphedema specialist as well make a referral to physical therapy for you.  Plan to follow up in October with an MRI of the pelvis, knee, and left femur prior to that visit.

## 2015-09-15 NOTE — Progress Notes (Signed)
[facility] NOTE  Patient Care Team: Darcus Austin, MD as PCP - General (Family Medicine)  CHIEF COMPLAINTS/PURPOSE OF CONSULTATION: Left lower extremity desmoid tumor.  Request establishment for long-term follow-up.   HISTORY OF PRESENTING ILLNESS:  [name] is a very pleasant 50 yo gentleman who presents today with his wife to discuss his history of a left lower extremity desmoid tumor that has undergone resection, re-excision of local recurrence and subsequent adjuvant external beam radiation therapy.  His history dates back to the Fall of 2014 when he had an ill-defined sensation involving his left thigh.  By February of 2015 he described the feeling as his left thigh was sitting on a book.  He underwent evaluation and a MRI scan was obtained identifying two masses involving the posterior/lateral deep left thigh with a second primary mass more proximal near the hip.  He underwent an uncomplicated resection by Dr. Leonides Schanz [facility]) and was told that there were microscopically involved margins.  He saw Dr. Valere Dross of radiation oncology who recommended observation.  He had physical therapy and began routine imaging and physical exams.  By May of 2016 he was noted to have evidence of local recurrence prompting re-excision and he recalls being told that the margins were again involved.  At this point he began external beam radiation therapy from September through November of 2016.  He reports he was advised to undergo annual MRI with physical examination.  Currently, he experiences severe stiffness and decreased mobility of the left knee and hip.  He has symptomatic lymphedema and is undergoing evaluation for a compression device to assist with this issue.  He has had limited success with the compression stockings secondary to discomfort (hot) and sliding down.  He requires a cane to assist his ambulation and has recently undergone evaluation of his right knee that has begun to cause him  pain.  Finally, he reports symptoms of burning and pain radiating from his right hip over his anterior/lateral thigh made worse by touch, even from his clothing.  He has chronic pain requiring narcotic pain medication daily, gabapentum and  maximum strength ibuprofen daily with residual pain despite the medications.  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  His desire is to avoid going on disability and to continue his job.  I reviewed her records extensively and collaborated the history with the patient.  MEDICAL HISTORY:  Past Medical History  Diagnosis Date  . Asthma   . Pneumonia     history of  . 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 Leonides Schanz  . Meralgia paraesthetica March 2015    Bilaterally  . Diverticulitis   . S/P radiation therapy 01/14/2015 through 02/20/2015     Left posterior thighs/inferior pelvis 5600 cGy in 28 sessions     SURGICAL HISTORY: Past Surgical History  Procedure Laterality Date  . Laparoscopic gastric banding  04-26-2007  . Repair recurrent right inguinal hernia  10-22-2008  . Surg. for undescended testicles/ bilateral inguinal hernia repair  AGE 37 MON OLD  . Left shoulder surg  2000    rotator cuff  . Hernia repair  08/2008    rt inguinal  . Shoulder arthroscopy  01/2009    rt   . Shoulder arthroscopy  07/01/2011    Procedure: ARTHROSCOPY SHOULDER;  Surgeon: Magnus Sinning, MD;  Location: [facility];  Service: Orthopedics;  Laterality: Left;  WITH LABRAL DEBRIDEMENT  . Shoulder open rotator cuff repair  07/01/2011    Procedure: ROTATOR CUFF REPAIR SHOULDER OPEN;  Surgeon: Magnus Sinning, MD;  Location: [facility];  Service: Orthopedics;  Laterality: Left;  OPEN DISTAL CLAVICLE REPAIR  . Vasectomy     . Leg surgery      Desmoid tumor  . Laparoscopic sigmoid colectomy N/A 08/03/2014    Procedure: LAPAROSCOPIC TAKEDOWN INTRA-ABDOMINAL ABSCESS, SIGMOID COLECTOMY WITH PRIMARY ANASTAMOSIS, APPENDECTOMY, MOBILIZATION OF SPLENIC FLEXURE;  Surgeon: Johnathan Hausen, MD;  Location: [facility];  Service: General;  Laterality: N/A;  . Left thigh fibromatosis resection  06/30/13    SOCIAL HISTORY: Social History   Social History  . Marital Status: Married    Spouse Name: N/A  . Number of Children: N/A  . Years of Education: N/A   Occupational History  . Not on file.   Social History Main Topics  . Smoking status: Former Smoker -- 0.25 packs/day for 1 years    Quit date: 06/25/1988  . Smokeless tobacco: Former Systems developer     Comment: smoked socially  . Alcohol Use: No  . Drug Use: No  . Sexual Activity: Yes   Other Topics Concern  . Not on file   Social History Narrative   He works in Therapist, art for [employer], drilling.   He lives with his wife and they have three daughters.   Highest level of education:  Some college          FAMILY HISTORY: Family History  Problem Relation Age of Onset  . Heart disease Father   . Cancer Mother     kidney, spleen    ALLERGIES:  is allergic to methylprednisolone; penicillins; solu-medrol; dilaudid; oxycodone; and percocet.  MEDICATIONS:  Current Outpatient Prescriptions  Medication Sig Dispense Refill  . albuterol (PROVENTIL HFA;VENTOLIN HFA) 108 (90 BASE) MCG/ACT inhaler Inhale 2 puffs into the lungs every 6 (six) hours as needed for wheezing or shortness of breath (wheezing).     Marland Kitchen diclofenac sodium (VOLTAREN) 1 % GEL Apply 2 g topically 3 (three) times daily as needed. 100 g 0  . docusate sodium (COLACE) 100 MG capsule Take 100 mg by mouth 3 (three) times daily as needed for mild constipation.    . Fluticasone-Salmeterol (ADVAIR) 250-50 MCG/DOSE AEPB Inhale 1 puff into the lungs 2 (two) times daily.     Marland Kitchen gabapentin (NEURONTIN) 300 MG capsule  Take 1 capsule (300 mg total) by mouth 2 (two) times daily. Take 600 mg qam and 1200 mg qhs 180 capsule 2  . gabapentin (NEURONTIN) 600 MG tablet Take 2 tablets (1,200 mg total) by mouth at bedtime. 180 tablet 3  . HYDROcodone-acetaminophen (NORCO) 5-325 MG tablet Take 2 tablets by mouth every 6 (six) hours as needed for moderate pain. 120 tablet 0  . ibuprofen (ADVIL,MOTRIN) 800 MG tablet Take 1 tablet (800 mg total) by mouth daily as needed. 90 tablet 0  . montelukast (SINGULAIR) 10 MG tablet Take by mouth. Reported on 08/15/2015    . ondansetron (ZOFRAN) 4 MG tablet Take 1 tablet (4 mg total) by mouth every 8 (eight) hours as needed for nausea or vomiting. 30 tablet 2  . polyethylene glycol (MIRALAX / GLYCOLAX) packet Take 17 g by mouth daily. 14 each 0  . Vitamin D, Ergocalciferol, (DRISDOL) 50000 UNITS CAPS capsule Take 50,000 Units by mouth every 7 (seven) days. Days of week vary.  No current facility-administered medications for this visit.    REVIEW OF SYSTEMS:   Constitutional: Denies fevers, chills or abnormal night sweats Eyes: Denies blurriness of vision, double vision or watery eyes Ears, nose, mouth, throat, and face: Denies mucositis or sore throat Respiratory: Denies cough, dyspnea or wheezes Cardiovascular: Denies palpitation, chest discomfort or lower extremity swelling Gastrointestinal:  Denies nausea, heartburn or change in bowel habits Skin: Denies abnormal skin rashes Lymphatics: Denies new lymphadenopathy or easy bruising Neurological:Denies numbness, tingling or new weaknesses Behavioral/Psych: Mood is stable, no new changes   All other systems were reviewed with the patient and are negative.  PHYSICAL EXAMINATION: ECOG PERFORMANCE STATUS: 1 - Symptomatic but completely ambulatory  Filed Vitals:   09/13/15 1441  BP: 163/89  Temp: 98.1 F (36.7 C)  Resp: 20   Filed Weights   09/13/15 1441  Weight: 348 lb 3.2 oz (157.942 kg)    GENERAL:alert, no  distress and comfortable SKIN: skin color, texture, turgor are normal, no rashes or significant lesions EYES: normal, conjunctiva are pink and non-injected, sclera clear LYMPH:  1+ lymphedema of the LLE                              Musculoskeletal:no cyanosis of digits and no clubbing.  Weak 4/5 strength in LLE Knee flexion/hip abduction.  Otherwise, 5/5 strength throughout bilateral lower extremities.  Firm woody induration of the posterior left thigh particularly the distal half of the thigh with expected post radiation skin changes.  Well healed longitudinal scar extending the entire length of the left posterior thigh to the posterior lateral hip.  No palpable masses or cutaneous lesions to suggest local recurrence.  PSYCH: alert & oriented x 3 with fluent speech   RADIOGRAPHIC STUDIES: The most current radiographic evaluation of the Left Lower Extremity are unavailable for review, however the report from a MRI dated 09/02/2015 describes post surgery/radiation changes without evidence of local recurrence.   ASSESSMENT AND PLAN:  This patient is s/p resection of left leg desmoid fibromatosis with subsequent local recurrence treated with re-excision and adjuvant external beam radiation therapy.  At present, he appears to be free of recurrence. We had an extended discussion of his treatment to date, which I completely agree with.  We reviewed the biology and natural history of these tumors and they understand it has a low metastatic potential, however this can be a challenging disease to maintain long-term local control while minimizing treatment side effects.  Due to the fact that he has already had one local recurrence in a relatively short period of time, I have recommended that we have him undergo MRI/exam every six months for two years, then transition to annual for the next three years.  He has asked that I assume his surveillance due to travel and work considerations.  We will have the most  recent imaging loaded into the PACS system for review and future comparison.   We will obtain his pathology slides for review by our pathologist. We will plan to see him back in six months from the time of most recent imaging for repeat MRI and physical exam.  We will contact our lymphedema therapist to discuss a treatment plan. We will contact a physical therapist to discuss a treatment plan. We will obtain the report and images of his recent spine imaging to see if there is a report of nerve impingement that may explain his right leg/hip symptoms.  All  questions were answered. The patient knows to call the clinic with any problems, questions or concerns.    Frederich Cha, MD 9:05 AM

## 2015-09-16 ENCOUNTER — Other Ambulatory Visit: Payer: Self-pay | Admitting: Gynecologic Oncology

## 2015-09-16 ENCOUNTER — Telehealth: Payer: Self-pay | Admitting: *Deleted

## 2015-09-16 DIAGNOSIS — I890 Lymphedema, not elsewhere classified: Secondary | ICD-10-CM

## 2015-09-16 DIAGNOSIS — M25662 Stiffness of left knee, not elsewhere classified: Secondary | ICD-10-CM

## 2015-09-16 DIAGNOSIS — D48119 Desmoid tumor of unspecified site: Secondary | ICD-10-CM

## 2015-09-16 DIAGNOSIS — R29898 Other symptoms and signs involving the musculoskeletal system: Secondary | ICD-10-CM

## 2015-09-16 DIAGNOSIS — D481 Neoplasm of uncertain behavior of connective and other soft tissue: Secondary | ICD-10-CM

## 2015-09-16 NOTE — Telephone Encounter (Signed)
Per Dr. Rolanda Jay patient needs a referral to [facility] orthopedics for stiffness and decreased mobility of the left knee and hip. Spoke with [place] at [facility] who stated that Nathaniel Chambers is already a patient but no new appointmentts can be scheduled until Nathaniel Chambers calls the office himself. Patient was notified and states he will contact the office

## 2015-09-18 ENCOUNTER — Other Ambulatory Visit: Payer: Self-pay | Admitting: General Surgery

## 2015-09-18 ENCOUNTER — Inpatient Hospital Stay
Admission: RE | Admit: 2015-09-18 | Discharge: 2015-09-18 | Disposition: A | Discharge status: Home / Self Care | Payer: Self-pay | Source: Ambulatory Visit | Attending: General Surgery | Admitting: General Surgery

## 2015-09-18 DIAGNOSIS — G5710 Meralgia paresthetica, unspecified lower limb: Secondary | ICD-10-CM

## 2015-09-19 ENCOUNTER — Ambulatory Visit: Payer: Self-pay | Admitting: Physical Therapy

## 2015-09-19 ENCOUNTER — Ambulatory Visit: Payer: Commercial Managed Care - PPO | Attending: Gynecologic Oncology | Admitting: Physical Therapy

## 2015-09-19 ENCOUNTER — Encounter: Payer: Self-pay | Admitting: Physical Therapy

## 2015-09-19 DIAGNOSIS — I890 Lymphedema, not elsewhere classified: Secondary | ICD-10-CM | POA: Insufficient documentation

## 2015-09-19 DIAGNOSIS — R262 Difficulty in walking, not elsewhere classified: Secondary | ICD-10-CM | POA: Diagnosis present

## 2015-09-19 DIAGNOSIS — R293 Abnormal posture: Secondary | ICD-10-CM | POA: Diagnosis present

## 2015-09-19 DIAGNOSIS — M6281 Muscle weakness (generalized): Secondary | ICD-10-CM | POA: Insufficient documentation

## 2015-09-19 DIAGNOSIS — M25561 Pain in right knee: Secondary | ICD-10-CM | POA: Insufficient documentation

## 2015-09-19 NOTE — Therapy (Signed)
[place], [place], [address] Phone: [phone]   Fax:  [phone]  Physical Therapy Evaluation  Patient Details  Name: Nathaniel Chambers MRN: [id number] Date of Birth: January 07, 1966 Referring Provider: Dr. Frederich Cha  Encounter Date: 09/19/2015      PT End of Session - 09/19/15 0908    Visit Number 1   Number of Visits 1   PT Start Time 0730   PT Stop Time 0900   PT Time Calculation (min) 90 min   Activity Tolerance Patient tolerated treatment well   Behavior During Therapy [facility] for tasks assessed/performed      Past Medical History  Diagnosis Date  . Asthma   . Pneumonia     history of  . 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 Leonides Schanz  . Meralgia paraesthetica March 2015    Bilaterally  . Diverticulitis   . S/P radiation therapy 01/14/2015 through 02/20/2015     Left posterior thighs/inferior pelvis 5600 cGy in 28 sessions     Past Surgical History  Procedure Laterality Date  . Laparoscopic gastric banding  04-26-2007  . Repair recurrent right inguinal hernia  10-22-2008  . Surg. for undescended testicles/ bilateral inguinal hernia repair  AGE 8 MON OLD  . Left shoulder surg  2000    rotator cuff  . Hernia repair  08/2008    rt inguinal  . Shoulder arthroscopy  01/2009    rt   . Shoulder arthroscopy  07/01/2011    Procedure: ARTHROSCOPY SHOULDER;  Surgeon: Magnus Sinning, MD;  [facility];  Service: Orthopedics;  Laterality: Left;  WITH LABRAL DEBRIDEMENT  . Shoulder open rotator cuff repair  07/01/2011    Procedure: ROTATOR CUFF REPAIR SHOULDER OPEN;  Surgeon: Magnus Sinning, MD;  Location: [facility];  Service: Orthopedics;  Laterality: Left;  OPEN DISTAL CLAVICLE REPAIR  . Vasectomy    . Leg surgery      cancer  . Laparoscopic sigmoid colectomy N/A 08/03/2014     Procedure: LAPAROSCOPIC TAKEDOWN INTRA-ABDOMINAL ABSCESS, SIGMOID COLECTOMY WITH PRIMARY ANASTAMOSIS, APPENDECTOMY, MOBILIZATION OF SPLENIC FLEXURE;  Surgeon: Johnathan Hausen, MD;  Location: [facility];  Service: General;  Laterality: N/A;  . Left thigh fibromatosis resection  06/30/13    There were no vitals filed for this visit.       Subjective Assessment - 09/19/15 0913    Subjective Had desmoid tumor 06/30/13 resected from left gluteal region and posterior thigh and then it recurred and he had it resected again 09/28/14.  He underwent radiation which was completed in November 2016. Since that time, he has had functional limitations and left leg lymphedema.  He denies genital swelling.   Pertinent History Desmoid tumor left glut and posterior thigh resected 06/30/13 and 09/28/14.  Colon resection 08/03/14.   Limitations Lifting;Standing;Walking;House hold activities   How long can you sit comfortably? 30 minutes   How long can you stand comfortably? 15 minutes   How long can you walk comfortably? Ambulates with single point cane; can walk 2-3 minutes without stopping   Patient Stated Goals Walk with less difficulty, reduce swelling, be able to do his work            [facility] PT Assessment - 09/19/15 0001    Assessment   Medical Diagnosis Left leg desmoid tumor resected   Referring Provider Dr. Frederich Cha   Onset Date/Surgical Date 09/28/14   Hand Dominance Left  Next MD Visit 11/17   Prior Therapy Had PT in [place] for 1 visit   Precautions   Precautions Fall  Ambulates with single point cane   Restrictions   Weight Bearing Restrictions No   Balance Screen   Has the patient fallen in the past 6 months No   Has the patient had a decrease in activity level because of a fear of falling?  No   Is the patient reluctant to leave their home because of a fear of falling?  No   Home Environment   Living Environment Private residence   Living Arrangements Spouse/significant other;Children   Wife, 44 y.o. twin daughters and 55 y.o. 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; sitting most of the day   Leisure Unable to exercise right now   Cognition   Overall Cognitive Status Within Functional Limits for tasks assessed   Observation/Other Assessments   Other Surveys  --  Lymphedema Life Impact Scale - score 55; 81% impairment   Sensation   Light Touch Impaired by gross assessment  Left posterior/lateral thigh / glut - reduced sensation    Posture/Postural Control   Posture/Postural Control Postural limitations   Postural Limitations Forward head;Rounded Shoulders   ROM / Strength   AROM / PROM / Strength AROM;Strength   AROM   Overall AROM Comments Full bilateral knee ROM; able ot perform full knee extension in sitting with some c/o stiffness and pain on right knee   AROM Assessment Site Knee   Right/Left Knee Right;Left   Strength   Strength Assessment Site Hip;Knee   Right/Left Hip Right;Left   Right Hip Flexion 5/5   Right Hip Extension 3-/5   Right Hip External Rotation  4-/5  Limited by pain   Right Hip Internal Rotation 5/5   Right Hip ABduction 4+/5   Right Hip ADduction 3-/5   Left Hip Flexion 5/5   Left Hip Extension 4+/5   Left Hip External Rotation 5/5   Left Hip Internal Rotation 5/5   Left Hip ABduction 4+/5   Left Hip ADduction 4+/5   Right/Left Knee Right;Left   Right Knee Flexion 4-/5  limited by pain   Right Knee Extension 5/5   Left Knee Flexion 5/5   Left Knee Extension 5/5   Palpation   Patella mobility Normal right patellar mobility; reduced left patellar mobility due to edema   Palpation comment Palpable fibrosis / hardness present on left posterior and lateral  thigh; poor scar mobilization; very tender to palpation with hyersensitivity   Special Tests   Knee Special tests  other   other    Findings Positive   Side  Right   Comments Pain on right medial knee with right genu valgus stress test   Transfers   Transfers Independent with all Transfers  But is slow and has some difficulty   Ambulation/Gait   Gait Comments Slow antalgic gait with single point cane; primarily limited by right knee pain           LYMPHEDEMA/ONCOLOGY QUESTIONNAIRE - 09/19/15 0752    Type   Cancer Type Desmoid tumor   Date Lymphedema/Swelling Started   Date 02/26/15   Treatment   Active Chemotherapy Treatment No  Past Chemotherapy Treatment No   Active Radiation Treatment No   Past Radiation Treatment Yes   Date 02/26/15   Body Site left posterior leg and glut   Current Hormone Treatment No   Past Hormone Therapy No   What other symptoms do you have   Are you Having Heaviness or Tightness Yes   Are you having Pain Yes   Are you having pitting edema Yes   Body Site left leg   Is it Hard or Difficult finding clothes that fit Yes   Do you have infections No   Is there Decreased scar mobility Yes   Lymphedema Stage   Stage STAGE 2 SPONTANEOUSLY IRREVERSIBLE   Lymphedema Assessments   Lymphedema Assessments Lower extremities   Right Lower Extremity Lymphedema   At Groin Measure at Horizontal from Pubic Bone 81.5 cm   20 cm Proximal to Suprapatella 75.5 cm   10 cm Proximal to Suprapatella 62.6 cm   At Midpatella/Popliteal Crease 42.5 cm   30 cm Proximal to Floor at Lateral Plantar Foot 39.6 cm   20 cm Proximal to Floor at Lateral Plantar Foot 29.5 1   10  cm Proximal to Floor at Lateral Malleoli 27.8 cm   5 cm Proximal to 1st MTP Joint 24.8 cm   Across MTP Joint 24.5 cm   Around Proximal Great Toe 9.5 cm   Other 46.5  40 cm proximal to floor at lateral plantar   Left Lower Extremity Lymphedema   At Groin Measure at Horizontal from Pubic Bone 81.1 cm    20 cm Proximal to Suprapatella 77.3 cm   10 cm Proximal to Suprapatella 63.9 cm   At Midpatella/Popliteal Crease 42 cm   30 cm Proximal to Floor at Lateral Plantar Foot 38.8 cm   20 cm Proximal to Floor at Lateral Plantar Foot 29.5 cm   10 cm Proximal to Floor at Lateral Malleoli 27.5 cm   5 cm Proximal to 1st MTP Joint 26 cm   Across MTP Joint 25.2 cm   Around Proximal Great Toe 9.2 cm   Other 45.9  40 cm proximal to floor at lateral plantar                        PT Education - 09/19/15 0908    Education provided Yes   Education Details HEP and plan   Person(s) Educated Patient;Spouse   Methods Explanation;Demonstration;Handout   Comprehension Returned demonstration;Verbalized understanding                [facility] Goals - 09/19/15 [phone]    CC Long Term Goal  #1   Title Patient will be able to demonstrate home exercise program given at evaluation   Time 1   Period Days   Status Achieved   CC Long Term Goal  #2   Title Patient and his wife will verbalize understanding of scar desensitization techniques   Time 1   Period Days   Status Achieved   CC Long Term Goal  #3   Title Patient and his wife will verbalize understanding of overall treatment plan including getting compression garments, doing scar desensitization, doing home exercises, and going to [facility] for right knee pain.   Time 1   Period Days   Status Achieved            Plan - 09/19/15 0914    Clinical Impression Statement Patient is a very pleasant 50 y.o. man s/p desmoid  tumor resection 06/30/13 left gluteal region and posterior thigh. It then recurred and he had it resected again 09/28/14.  He underwent external beam radiation to the same area which was completed in November 2016. Since that time, he has had functional limitations and left leg lymphedema.  He denies genital swelling.  More recently, his right knee began hurting approximately 6 weeks ago which has  been more functionally limiting that his left leg.  He is currently walking with a single point cane.  His right medial knee along the joint line is painful to palpation and pain was severe with a genu valgus stress test.  His right knee was x-rayed and was negative for any pathology.  He is continuing to work in Therapist, art but that is complicated by the fact that he lives in [place] but is working in [place] and is trying hard to keep his job.  Due to that, he is unable to attend physical therapy appointments during our normal open hours.  He is getting a Flexitouch pump delivered today which will help his swelling and fibrosis of his left leg.  We are working with a sales rep to try to get him a custom thigh high compression stocking and also a night time Reid sleeve compression garment.  With those garments, he may manage his swelling well at home and would benefit from lymphedema treatment when work schedules allows.  He would benefit from physical therapy to also address his right knee pain, left leg scar tissue, left hip weakness, and overall mobility.  He agreed to go to [facility] for PT as they have hours that can accommodate him.  Due to his home situation of needing to care for his 3 daughters, his work situation of wanting to continue working although it is out of town, his evolving medical condition with the new knee pain being likely due to altered gait from his left leg impairments, and the complexity of determining an appropriate plan, this evaluation is of moderate complexity.   Rehab Potential Good   Clinical Impairments Affecting Rehab Potential Lives in [place] / works in [place]   PT Frequency One time visit   PT Treatment/Interventions ADLs/Self Care Home Management;Therapeutic exercise   PT Next Visit Plan we will be happy to see him in the future as his work schedules allows   PT Home Exercise Plan Hamstring stretching and left hip abduction strengthening    Recommended Other [facility] to accommodate his work schedule   Consulted and Agree with Plan of Care Patient;Family member/caregiver   Family Member Consulted wife      Patient will benefit from skilled therapeutic intervention in order to improve the following deficits and impairments:  Abnormal gait, Decreased activity tolerance, Decreased mobility, Decreased strength, Increased edema, Impaired sensation, Postural dysfunction, Impaired flexibility, Decreased scar mobility, Decreased knowledge of precautions, Pain, Obesity, Decreased endurance, Decreased range of motion, Difficulty walking, Increased fascial restricitons  Visit Diagnosis: Lymphedema, not elsewhere classified - Plan: PT plan of care cert/re-cert  Abnormal posture - Plan: PT plan of care cert/re-cert  Pain in right knee - Plan: PT plan of care cert/re-cert  Difficulty in walking, not elsewhere classified - Plan: PT plan of care cert/re-cert  Muscle weakness (generalized) - Plan: PT plan of care cert/re-cert     Problem List Patient Active Problem List   Diagnosis Date Noted  . Desmoid fibromatosis 12/13/2014  . S/P partial colectomy 08/03/2014  . Meralgia paraesthetica 07/12/2014  . Diverticulitis of  large intestine without perforation or abscess without bleeding   . Enteritis due to Clostridium difficile   . Obesity 04/10/2014  . Colonic diverticular abscess 04/04/2014  . Sigmoid diverticulitis 04/04/2014  . Elevated lipase 12/19/2013  . Diverticulitis 12/15/2013  . Asthma 12/15/2013  . Fatty liver 12/15/2013    Annia Friendly, PT 09/19/2015 9:33 AM  [place], [place], [address] Phone: [phone]   Fax:  [phone]  Name: Nathaniel Chambers MRN: [id number] Date of Birth: 1965/09/20

## 2015-09-19 NOTE — Patient Instructions (Signed)
Plan: PT to [facility] for a custom thigh high compression garment and Reid sleeve Patient to use Flexitouch daily for 1 hour Work with your wife on desensitization techniques Pt to do Hamstring stretching, abduction exercise on left leg Patient will contact [facility] for PT for right knee pain and to work on left leg scar Patient will contact us if and when he wants more intensive therapy for lymphedema   Desensitization Techniques  Perform these exercises ever 2 hours for 15 minute sessions.  Progress to the next exercises when the exercises you are doing become easy.  1)  Using light pressure, rub the various textures along with the hypersensitive area:  A.  [place]  C.  Wool  G.  Cotton material  D.  Terry cloth  2)  With the same textures use a firmer pressure. 3)  Use a hand held vibrator and massage along the sensitive area. 4)  With a small dowel rod, eraser on a pencil or base of an ink pen tap along the sensitive area. 5)  Use an empty roll-on deodorant bottle to roll along the sensitive area. 6)  Place your hand/forearm in separate containers of the following items:  A.  Sand  D.  Dry lentil beans  B.  Dry Rice  E.  Dry kidney beans  C.  Ball bearings F.  Dry pinto beans  Scar Massage Purpose: To soften/smooth scar tissue.   To desensitize sensitive areas after surgery.   To mechanically break up inner scar tissue, adhesions, therefore allowing freer        movement of injured tendons and muscle.  Technique: Use a cream to massage with, as it insures a smooth gliding motion and avoids irritation    caused by rubbing skin to skin.  Cream is preferred over a lotion.   May begin as soon as any suture areas are healed.   Apply a firm, steady pressure with your finger-tip, pulling the skin in a circular motion over the   scarred area.  Do not rub.  Message 3 minutes, at least 2-3 times a day, unless you are getting  tender afterwards   Copyright  VHI. All rights reserved.  Abduction Lift    Tighten muscles on outside of hip to raise  limb _12___ inches. Hold __3__ seconds. Repeat _10___ times. Do __2__ sessions per day.  Copyright  VHI. All rights reserved.   Hamstring Stretch (Sitting)    Sitting, extend one leg and place hands on same thigh for support. Keeping torso straight, lean forward, sliding hands down leg, until a stretch is felt in back of thigh. Hold __15__ seconds. Repeat with other leg. Do this 3 times, twice a day.  Copyright  VHI. All rights reserved.

## 2015-10-01 ENCOUNTER — Telehealth: Payer: Self-pay | Admitting: *Deleted

## 2015-10-01 ENCOUNTER — Encounter: Payer: Self-pay | Admitting: *Deleted

## 2015-10-01 NOTE — Telephone Encounter (Addendum)
Returned call to Nathaniel Chambers after speaking with Dr. Sondra Come about the request for a refill on his Hydrocodone and Ibuprofen.   [facility] [address].  I asked him to call the pharmacy to make sure the prescriptions have been filled before going to the pharmacy to purchase the prescriptions.

## 2015-10-08 ENCOUNTER — Encounter: Payer: Self-pay | Admitting: Radiation Oncology

## 2015-10-09 ENCOUNTER — Telehealth: Payer: Self-pay | Admitting: *Deleted

## 2015-10-09 ENCOUNTER — Encounter: Payer: Self-pay | Admitting: *Deleted

## 2015-10-09 ENCOUNTER — Other Ambulatory Visit: Payer: Self-pay | Admitting: Radiation Oncology

## 2015-10-09 DIAGNOSIS — D481 Neoplasm of uncertain behavior of connective and other soft tissue: Secondary | ICD-10-CM

## 2015-10-09 MED ORDER — IBUPROFEN 800 MG PO TABS: 800.0000 mg | ORAL_TABLET | Freq: Every day | ORAL | Status: DC | PRN

## 2015-10-09 MED ORDER — HYDROCODONE-ACETAMINOPHEN 5-325 MG PO TABS: 2.0000 | ORAL_TABLET | Freq: Four times a day (QID) | ORAL | Status: DC | PRN

## 2015-10-09 NOTE — Telephone Encounter (Signed)
Thanks, Tonie. He will have to pick up the script for Norco.  I cannot refill electronically. Please ask him what the status of his physical therapy is...where he is being seen and how the flexitouch pump is working.  I hope he is feeling better. SW

## 2015-10-09 NOTE — Progress Notes (Signed)
Spoke with  Nathaniel Chambers and let him know he could pick up his Narco prescription.  He is doing well with his physical therapy it started last Friday.  He will have PT again this Friday then twice a week.  The flexi touch pump is working well he is using it daily.   His wife will pick up the prescription tomorrow.

## 2015-10-09 NOTE — Telephone Encounter (Signed)
[name] called requesting a refill on his Hydrocodone and Ibuprofen.  [facility] [address] [phone].

## 2015-10-22 ENCOUNTER — Other Ambulatory Visit: Payer: Self-pay | Admitting: Gynecologic Oncology

## 2015-10-22 DIAGNOSIS — D481 Neoplasm of uncertain behavior of connective and other soft tissue: Secondary | ICD-10-CM

## 2015-10-22 NOTE — Progress Notes (Signed)
Patient called and informed of recommendation for referral to Medical Oncology, Dr. Alen Blew to discuss potential treatment options for fibromatosis.  Verbalizing understanding.  Advised he will receive a phone call from the [facility] with an appointment.  Advised to call for any needs.

## 2015-11-20 ENCOUNTER — Telehealth: Payer: Self-pay | Admitting: Oncology

## 2015-11-20 NOTE — Telephone Encounter (Signed)
Pt confirmed appt and completed intake

## 2015-11-21 ENCOUNTER — Telehealth: Payer: Self-pay | Admitting: Oncology

## 2015-11-21 ENCOUNTER — Ambulatory Visit (HOSPITAL_BASED_OUTPATIENT_CLINIC_OR_DEPARTMENT_OTHER): Payer: Commercial Managed Care - PPO | Admitting: Oncology

## 2015-11-21 VITALS — BP 156/83 | HR 86 | Temp 98.0°F | Resp 18 | Ht 74.0 in | Wt 345.7 lb

## 2015-11-21 DIAGNOSIS — Z87891 Personal history of nicotine dependence: Secondary | ICD-10-CM

## 2015-11-21 DIAGNOSIS — D481 Neoplasm of uncertain behavior of connective and other soft tissue: Secondary | ICD-10-CM

## 2015-11-21 DIAGNOSIS — G8929 Other chronic pain: Secondary | ICD-10-CM

## 2015-11-21 MED ORDER — HYDROCODONE-ACETAMINOPHEN 5-325 MG PO TABS: 2.0000 | ORAL_TABLET | Freq: Four times a day (QID) | ORAL | 0 refills | Status: DC | PRN

## 2015-11-21 MED ORDER — ONDANSETRON HCL 4 MG PO TABS: 4.0000 mg | ORAL_TABLET | Freq: Three times a day (TID) | ORAL | 2 refills | Status: DC | PRN

## 2015-11-21 MED ORDER — IBUPROFEN 800 MG PO TABS: 800.0000 mg | ORAL_TABLET | Freq: Every day | ORAL | 0 refills | Status: DC | PRN

## 2015-11-21 NOTE — Progress Notes (Signed)
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   HPI: This is a pleasant 50 year old gentleman native of [place] but have been living in this area for an extended period of time. He was found to have a mass involving the posterior and the lateral aspect of his deep left thigh after experiencing pain in that area. MRI at that time from the presence of 2 masses involving the left thigh as well as a second primary mass in the near his hip. He underwent resection initially 2015 under the care of Dr. Leonides Schanz with positive margins. He underwent a reexcision in May 2016 with evidence of recurrence at that time. He received adjuvant external beam radiation between September and November 2016. He has been on active surveillance since that time without any further therapy. His most recent MRI done on May 2017 at [facility]. The report indicates the following findings:  "There is a nodular enhancing lesion of the posterior compartment of the distal thigh along the medial aspect of the semimembranosus muscle at the level of the superior patella which appears decreased in size from the previous exam.  Although there is somewhat focal prominent enhancement involving the deep aspect of the proximal adductor magnus adjacent to the proximal femur no definite nodular enhancing masses or lesions are identified at the level of previous nodular  Enhancement.  New edema, mild enhancement and atrophy of the posterior compartment and partially of the medial compartment musculature of the thigh likely related to previous surgery and radiation therapy."  Patient had been receiving physical therapy which have helped with his overall functionality and stiffness. He does report chronic leg pain and uses hydrocodone and ibuprofen. His pain is predominantly at nighttime or during the day is there but is more manageable. He continues to attend to her activities of daily living. He does use a cane and does not report any falls or  syncope.  He does report some paresthesias on the right thigh which she noted postoperatively in 2015. He takes Neurontin for that at that time.   He is not report any constitutional symptoms of fevers or chills or sweats. He does not report any headaches, blurry vision, syncope or seizures. He does not report any fevers or chills or sweats. He does not report any cough, wheezing or hemoptysis. He does not report any nausea, vomiting or abdominal pain. He does not report any frequency urgency or hesitancy. Remainder review of systems unremarkable.   Past Medical History:  Diagnosis Date  . Asthma   . 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 Leonides Schanz  . Diverticulitis   . Meralgia paraesthetica March 2015   Bilaterally  . Pneumonia    history of  . S/P radiation therapy 01/14/2015 through 02/20/2015    Left posterior thighs/inferior pelvis 5600 cGy in 28 sessions   :  Past Surgical History:  Procedure Laterality Date  . HERNIA REPAIR  08/2008   rt inguinal  . LAPAROSCOPIC GASTRIC BANDING  04-26-2007  . LAPAROSCOPIC SIGMOID COLECTOMY N/A 08/03/2014   Procedure: LAPAROSCOPIC TAKEDOWN INTRA-ABDOMINAL ABSCESS, SIGMOID COLECTOMY WITH PRIMARY ANASTAMOSIS, APPENDECTOMY, MOBILIZATION OF SPLENIC FLEXURE;  Surgeon: Johnathan Hausen, MD;  Location: [facility];  Service: General;  Laterality: N/A;  . LEFT SHOULDER SURG  2000   rotator cuff  . Left Thigh Fibromatosis Resection  06/30/13  . LEG SURGERY     cancer  . REPAIR RECURRENT RIGHT INGUINAL HERNIA  10-22-2008  . SHOULDER ARTHROSCOPY  01/2009   rt   . SHOULDER  ARTHROSCOPY  07/01/2011   Procedure: ARTHROSCOPY SHOULDER;  Surgeon: Magnus Sinning, MD;  Location: [facility];  Service: Orthopedics;  Laterality: Left;  WITH LABRAL DEBRIDEMENT  . SHOULDER OPEN ROTATOR CUFF REPAIR  07/01/2011   Procedure: ROTATOR CUFF REPAIR SHOULDER OPEN;  Surgeon: Magnus Sinning,  MD;  Location: [facility];  Service: Orthopedics;  Laterality: Left;  OPEN DISTAL CLAVICLE REPAIR  . SURG. FOR UNDESCENDED TESTICLES/ BILATERAL INGUINAL HERNIA REPAIR  AGE 51 MON OLD  . vasectomy    :   Current Outpatient Prescriptions:  .  albuterol (PROVENTIL HFA;VENTOLIN HFA) 108 (90 BASE) MCG/ACT inhaler, Inhale 2 puffs into the lungs every 6 (six) hours as needed for wheezing or shortness of breath (wheezing). , Disp: , Rfl:  .  docusate sodium (COLACE) 100 MG capsule, Take 100 mg by mouth 3 (three) times daily as needed for mild constipation., Disp: , Rfl:  .  Fluticasone-Salmeterol (ADVAIR) 250-50 MCG/DOSE AEPB, Inhale 1 puff into the lungs 2 (two) times daily. , Disp: , Rfl:  .  gabapentin (NEURONTIN) 300 MG capsule, Take 1 capsule (300 mg total) by mouth 2 (two) times daily. Take 600 mg qam and 1200 mg qhs, Disp: 180 capsule, Rfl: 2 .  gabapentin (NEURONTIN) 600 MG tablet, Take 2 tablets (1,200 mg total) by mouth at bedtime., Disp: 180 tablet, Rfl: 3 .  HYDROcodone-acetaminophen (NORCO) 5-325 MG tablet, Take 2 tablets by mouth every 6 (six) hours as needed for moderate pain., Disp: 120 tablet, Rfl: 0 .  ibuprofen (ADVIL,MOTRIN) 800 MG tablet, Take 1 tablet (800 mg total) by mouth daily as needed., Disp: 90 tablet, Rfl: 0 .  ondansetron (ZOFRAN) 4 MG tablet, Take 1 tablet (4 mg total) by mouth every 8 (eight) hours as needed for nausea or vomiting., Disp: 30 tablet, Rfl: 2 .  polyethylene glycol (MIRALAX / GLYCOLAX) packet, Take 17 g by mouth daily., Disp: 14 each, Rfl: 0 .  Vitamin D, Ergocalciferol, (DRISDOL) 50000 UNITS CAPS capsule, Take 50,000 Units by mouth every 7 (seven) days. Days of week vary., Disp: , Rfl: :  Allergies  Allergen Reactions  . Methylprednisolone Other (See Comments)  . Penicillins Shortness Of Breath    Swelling of uvula; tolerates augmentin  . Solu-Medrol [Methylprednisolone Acetate] Anaphylaxis  . Dilaudid [Hydromorphone Hcl] Nausea And  Vomiting  . Oxycodone Nausea And Vomiting    Takes phenergan to alleviate symptoms  . Percocet [Oxycodone-Acetaminophen] Nausea And Vomiting  :  Family History  Problem Relation Age of Onset  . Heart disease Father   . Cancer Mother     kidney, spleen  :  Social History   Social History  . Marital status: Married    Spouse name: N/A  . Number of children: N/A  . Years of education: N/A   Occupational History  . Not on file.   Social History Main Topics  . Smoking status: Former Smoker    Packs/day: 0.25    Years: 1.00    Quit date: 06/25/1988  . Smokeless tobacco: Former Systems developer     Comment: smoked socially  . Alcohol use No  . Drug use: No  . Sexual activity: Yes   Other Topics Concern  . Not on file   Social History Narrative   He works in Therapist, art for [employer], drilling.   He lives with his wife and they have three daughters.   Highest level of education:  Some college        :  Pertinent items are noted in HPI.  Exam: Blood pressure (!) 156/83, pulse 86, temperature 98 F (36.7 C), temperature source Oral, resp. rate 18, height 6\' 2"  (1.88 m), weight (!) 345 lb 11.2 oz (156.8 kg), SpO2 98 %.  ECOG 1 General appearance: alert and cooperative. Appeared without distress. Head: Normocephalic, without obvious abnormality no oral ulcers or lesions. Neck: no adenopathy Back: negative Resp: clear to auscultation bilaterally Cardio: regular rate and rhythm, S1, S2 normal, no murmur, click, rub or gallop GI: soft, non-tender; bowel sounds normal; no masses,  no organomegaly  Extremities: Examination of his left thigh showed a stiff posterior thigh compartment. No lower extremity edema noted.  CBC    Component Value Date/Time   WBC 10.9 (H) 08/11/2014 0522   RBC 4.37 08/11/2014 0522   HGB 12.3 (L) 08/11/2014 0522   HCT 38.7 (L) 08/11/2014 0522   PLT 286 08/11/2014 0522   MCV 88.6 08/11/2014 0522   MCH 28.1 08/11/2014 0522   MCHC 31.8 08/11/2014 0522    RDW 14.0 08/11/2014 0522   LYMPHSABS 3.3 08/11/2014 0522   MONOABS 0.7 08/11/2014 0522   EOSABS 0.5 08/11/2014 0522   BASOSABS 0.0 08/11/2014 0522      Chemistry      Component Value Date/Time   NA 137 08/10/2014 0410   K 4.0 08/10/2014 0410   CL 104 08/10/2014 0410   CO2 26 08/10/2014 0410   BUN 9 08/10/2014 0410   CREATININE 1.12 08/10/2014 0410      Component Value Date/Time   CALCIUM 8.7 08/10/2014 0410   ALKPHOS 50 06/16/2014 0500   AST 38 (H) 06/16/2014 0500   ALT 38 06/16/2014 0500   BILITOT 0.4 06/16/2014 0500       Assessment and Plan:   50 year old gentleman with the following issues:  1. Left leg desmoid fibromatosis diagnosed in 2015. He is status post resection on multiple occasions most recent of which in 2016 followed by adjuvant radiation therapy. His most recent MRI in May 2017 showed potentially residual tumor although it is unclear if it is clear progression.  The natural course of these tumors were discussed with the patient today. Although these tumors have very little to no metastatic potential, local recurrence and local invasion is a basic characteristic of this tumor. Local therapy have been exacerbated at this time with potential third surgery would be more problematic moving forward.  I discussed with him the role of systemic therapy at this particular juncture. Options of therapy includes oral agents such as tamoxifen and imatinib that has been effective in these tumors. Intravenous chemotherapy have also been used with some success for local control with more aggressive symptomatic tumors.  My recommendation at this time to await his MRI in November 2017 which is already scheduled. He has clear-cut progression of disease, I will start him on tamoxifen.  Risks and benefits of this medication were reviewed today. These complications would include thromboembolism, lower extremity edema, GI toxicity among others. The benefit would be controlling his  disease from further progression.  2. Chronic pain: He is prescribed hydrocodone by Dr. Pablo Ledger from radiation oncology and expressed interest in receiving refills today. I explained to him that I would be happy to do that as long as he is to only get his prescription from my office moving forward. Given unclear instructions to adhere the pain regimen which includes hydrocodone and ibuprofen as needed. He does use Neurontin which is prescribed by his neurologist.  3. Follow-up: Will  be in November 2017 after his next MRI.

## 2015-11-21 NOTE — Telephone Encounter (Signed)
Gave pt cal & avs °

## 2015-12-18 ENCOUNTER — Telehealth: Payer: Self-pay | Admitting: *Deleted

## 2015-12-18 NOTE — Telephone Encounter (Signed)
Patient called requesting "letter for [employer] Job.  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  Hydrocodone ordered last month not used.  Not working for me and nausea when used.  I use ibuprofen.  I need a letter that states we are cancelling orders for hydrocodone for employment.  Return number if any questions [phone].

## 2015-12-19 ENCOUNTER — Other Ambulatory Visit: Payer: Self-pay | Admitting: *Deleted

## 2015-12-19 ENCOUNTER — Encounter: Payer: Self-pay | Admitting: *Deleted

## 2015-12-19 NOTE — Progress Notes (Signed)
Per Nathaniel Chambers phone call on 12/18/15, Nathaniel Chambers has written a letter for his job. This letter states that, per his request, we are cancelling his prescription for Hydrocodone. He is using Ibuprofen for pain control. Hydrocodone has been discontinued from his medication list. Nathaniel Chambers is aware of this.

## 2016-01-31 ENCOUNTER — Other Ambulatory Visit: Payer: Self-pay | Admitting: Oncology

## 2016-01-31 ENCOUNTER — Other Ambulatory Visit: Payer: Self-pay | Admitting: Neurology

## 2016-01-31 DIAGNOSIS — D481 Neoplasm of uncertain behavior of connective and other soft tissue: Secondary | ICD-10-CM

## 2016-01-31 NOTE — Telephone Encounter (Signed)
Patient was supposed to be seen 2 months from March appt. Please advise on refill.

## 2016-01-31 NOTE — Telephone Encounter (Signed)
OK to send prescription with 6 month refills.  He will need to see me again in March for future refills (at least once per year, if I'm writing medications).  Donika K. Posey Pronto, DO

## 2016-02-05 ENCOUNTER — Other Ambulatory Visit (HOSPITAL_COMMUNITY): Payer: Commercial Managed Care - PPO

## 2016-02-05 ENCOUNTER — Ambulatory Visit (HOSPITAL_COMMUNITY): Payer: Commercial Managed Care - PPO

## 2016-02-11 ENCOUNTER — Ambulatory Visit: Payer: Commercial Managed Care - PPO | Admitting: General Surgery

## 2016-02-19 ENCOUNTER — Other Ambulatory Visit: Payer: Self-pay | Admitting: Gynecologic Oncology

## 2016-02-19 ENCOUNTER — Telehealth: Payer: Self-pay | Admitting: *Deleted

## 2016-02-19 DIAGNOSIS — D481 Neoplasm of uncertain behavior of connective and other soft tissue: Secondary | ICD-10-CM

## 2016-02-19 MED ORDER — IBUPROFEN 800 MG PO TABS
800.0000 mg | ORAL_TABLET | Freq: Every day | ORAL | 0 refills | Status: AC | PRN
Start: 2016-02-19 — End: ?

## 2016-02-21 ENCOUNTER — Telehealth: Payer: Self-pay | Admitting: Gynecologic Oncology

## 2016-02-21 ENCOUNTER — Other Ambulatory Visit: Payer: Self-pay | Admitting: Gynecologic Oncology

## 2016-02-21 DIAGNOSIS — D481 Neoplasm of uncertain behavior of connective and other soft tissue: Secondary | ICD-10-CM

## 2016-02-21 DIAGNOSIS — M25561 Pain in right knee: Secondary | ICD-10-CM

## 2016-02-21 NOTE — Telephone Encounter (Signed)
Returned call to patient.  He had called stating he moved his MRI to Saturday since he started a new job and cannot miss work. He states in order to see Dr. Rolanda Jay, it would have to be at 7 am or after 5pm.  Information relayed to Dr. Rolanda Jay for recommendations since there are significant scheduling conflicts.  Advised patient he would receive a phone call back.

## 2016-02-21 NOTE — Telephone Encounter (Signed)
Nathaniel Chambers called and wanted to make Dr. Rolanda Jay aware of some issues he is having.  He is set up for MRIs of the left femur, knee, and pelvis then follow up with Dr. Rolanda Jay.  He states there is a hard knot on the inner aspect of his left knee but the scan should cover that.  Also complaining of right knee twinges/pain and back pain (MR of pelvis ordered).  Dr. Rolanda Jay made aware and patient advised he would receive a return call with Dr. Suzzanne Cloud recommendations.

## 2016-02-21 NOTE — Telephone Encounter (Signed)
Patient advised of Dr. Suzzanne Cloud recommendations to proceed with an MR of the right knee due to his complaints of twinges and popping in that knee with a history of a desmoid tumor in the left leg.  Patient stating he would need to cancel his appt with Dr. Rolanda Jay and Dr. Alen Blew due to his work schedule.

## 2016-03-04 ENCOUNTER — Telehealth: Payer: Self-pay | Admitting: Gynecologic Oncology

## 2016-03-04 NOTE — Telephone Encounter (Signed)
Left patient message confirming upcoming MRI appts.  Since he states he can only come in for office appts at 7 am or after 5 pm, I advised him in my message that I would have to check with Dr. Rolanda Jay to see if anything could be arranged.  Advised that he would also be having an MRI of the right knee to evaluate his complaints.  Advised to call back to the office for any questions or concerns.

## 2016-03-05 ENCOUNTER — Other Ambulatory Visit (HOSPITAL_COMMUNITY): Payer: Commercial Managed Care - PPO

## 2016-03-05 ENCOUNTER — Ambulatory Visit (HOSPITAL_COMMUNITY): Payer: Commercial Managed Care - PPO

## 2016-03-07 ENCOUNTER — Ambulatory Visit (HOSPITAL_COMMUNITY): Payer: Commercial Managed Care - PPO

## 2016-03-07 ENCOUNTER — Ambulatory Visit (HOSPITAL_COMMUNITY)
Admission: RE | Admit: 2016-03-07 | Discharge: 2016-03-07 | Disposition: A | Discharge status: Home / Self Care | Payer: Commercial Managed Care - PPO | Source: Ambulatory Visit | Attending: Gynecologic Oncology | Admitting: Gynecologic Oncology

## 2016-03-07 ENCOUNTER — Ambulatory Visit (HOSPITAL_COMMUNITY): Admission: RE | Admit: 2016-03-07 | Payer: Commercial Managed Care - PPO | Source: Ambulatory Visit

## 2016-03-07 DIAGNOSIS — D481 Neoplasm of uncertain behavior of connective and other soft tissue: Secondary | ICD-10-CM

## 2016-03-07 DIAGNOSIS — M25561 Pain in right knee: Secondary | ICD-10-CM

## 2016-03-08 ENCOUNTER — Ambulatory Visit (HOSPITAL_COMMUNITY): Payer: Commercial Managed Care - PPO

## 2016-03-08 ENCOUNTER — Ambulatory Visit (HOSPITAL_COMMUNITY): Admission: RE | Admit: 2016-03-08 | Payer: Commercial Managed Care - PPO | Source: Ambulatory Visit

## 2016-03-09 ENCOUNTER — Telehealth: Payer: Self-pay | Admitting: Gynecologic Oncology

## 2016-03-09 ENCOUNTER — Ambulatory Visit: Payer: Commercial Managed Care - PPO | Admitting: General Surgery

## 2016-03-09 NOTE — Telephone Encounter (Signed)
Spoke with [facility] about need to schedule pt for open MRI.  They state that they will contact the patient to ask their required questions and to schedule the appt.

## 2016-03-09 NOTE — Telephone Encounter (Signed)
Left message for patient informing him that he should be receiving a phone call from [facility] to schedule his open MRI.  Also advised him that we will reschedule his appt to see Dr. Rolanda Jay once he has his scans rescheduled.  Advised to call our office for any needs or concerns.

## 2016-03-10 ENCOUNTER — Ambulatory Visit: Payer: Commercial Managed Care - PPO | Admitting: General Surgery

## 2016-03-11 ENCOUNTER — Ambulatory Visit: Payer: Commercial Managed Care - PPO | Admitting: Oncology

## 2016-03-11 ENCOUNTER — Other Ambulatory Visit: Payer: Self-pay | Admitting: Oncology

## 2016-03-11 DIAGNOSIS — D481 Neoplasm of uncertain behavior of connective and other soft tissue: Secondary | ICD-10-CM

## 2016-03-17 ENCOUNTER — Telehealth: Payer: Self-pay | Admitting: Neurology

## 2016-03-17 NOTE — Telephone Encounter (Signed)
Please call patient back and inform him that we will put him on the waiting list.

## 2016-03-17 NOTE — Telephone Encounter (Signed)
PT called and said he is trying to get in with Dr Posey Pronto but because of his schedule he is having difficulty/Dawn CB# [phone]

## 2016-03-18 ENCOUNTER — Ambulatory Visit
Admission: RE | Admit: 2016-03-18 | Discharge: 2016-03-18 | Disposition: A | Discharge status: Home / Self Care | Payer: Commercial Managed Care - PPO | Source: Ambulatory Visit | Attending: Gynecologic Oncology | Admitting: Gynecologic Oncology

## 2016-03-18 MED ORDER — GADOBENATE DIMEGLUMINE 529 MG/ML IV SOLN
20.0000 mL | Freq: Once | INTRAVENOUS | Status: AC | PRN
  Administered 2016-03-18: 20 mL via INTRAVENOUS

## 2016-03-24 ENCOUNTER — Inpatient Hospital Stay: Admission: RE | Admit: 2016-03-24 | Payer: Commercial Managed Care - PPO | Source: Ambulatory Visit

## 2016-03-24 ENCOUNTER — Other Ambulatory Visit: Payer: Commercial Managed Care - PPO

## 2016-03-26 ENCOUNTER — Ambulatory Visit: Payer: Commercial Managed Care - PPO | Admitting: General Surgery

## 2016-04-04 ENCOUNTER — Other Ambulatory Visit: Payer: Commercial Managed Care - PPO

## 2016-04-06 ENCOUNTER — Telehealth: Payer: Self-pay | Admitting: *Deleted

## 2016-04-06 NOTE — Telephone Encounter (Signed)
Pt called ' because of the snow, GI cancelled my appt when I got there. I didn't r/s because they told me it would be after christmas." Called GI r/s pt appt for Monday 12/18 at 645pm. Returned call to pt gave new appt information. Advised pt I will contact Dr. Rolanda Jay regarding his MD appt 12/12 and call back with additional information. Without MRI results MD appt may  neeed t be rescheduled.

## 2016-04-07 ENCOUNTER — Ambulatory Visit: Payer: Commercial Managed Care - PPO | Admitting: General Surgery

## 2016-04-07 ENCOUNTER — Telehealth: Payer: Self-pay | Admitting: Nurse Practitioner

## 2016-04-07 NOTE — Telephone Encounter (Signed)
Per Lenna Sciara, NP, patient rescheduled from 04/07/16 to 04/14/16 at 5:30 pm. Patient will have MRI on 12/18 and is aware of those apt details. He verbalizes understanding of new date and time and why we rescheduled him and thanks for the call.

## 2016-04-13 ENCOUNTER — Ambulatory Visit
Admission: RE | Admit: 2016-04-13 | Discharge: 2016-04-13 | Disposition: A | Discharge status: Home / Self Care | Payer: Commercial Managed Care - PPO | Source: Ambulatory Visit

## 2016-04-13 ENCOUNTER — Other Ambulatory Visit: Payer: Self-pay | Admitting: Gynecologic Oncology

## 2016-04-13 ENCOUNTER — Ambulatory Visit
Admission: RE | Admit: 2016-04-13 | Discharge: 2016-04-13 | Disposition: A | Discharge status: Home / Self Care | Payer: Commercial Managed Care - PPO | Source: Ambulatory Visit | Attending: Gynecologic Oncology | Admitting: Gynecologic Oncology

## 2016-04-13 DIAGNOSIS — M25561 Pain in right knee: Secondary | ICD-10-CM

## 2016-04-13 DIAGNOSIS — D481 Neoplasm of uncertain behavior of connective and other soft tissue: Secondary | ICD-10-CM

## 2016-04-13 MED ORDER — GADOBENATE DIMEGLUMINE 529 MG/ML IV SOLN
20.0000 mL | Freq: Once | INTRAVENOUS | Status: AC | PRN
  Administered 2016-04-13: 15 mL via INTRAVENOUS

## 2016-04-14 ENCOUNTER — Other Ambulatory Visit: Payer: Self-pay | Admitting: Gynecologic Oncology

## 2016-04-14 ENCOUNTER — Other Ambulatory Visit: Payer: Self-pay | Admitting: General Surgery

## 2016-04-14 ENCOUNTER — Ambulatory Visit (HOSPITAL_BASED_OUTPATIENT_CLINIC_OR_DEPARTMENT_OTHER): Payer: Commercial Managed Care - PPO | Admitting: General Surgery

## 2016-04-14 VITALS — BP 173/96 | HR 74 | Temp 98.1°F | Resp 18 | Ht 74.0 in | Wt 345.0 lb

## 2016-04-14 DIAGNOSIS — D481 Neoplasm of uncertain behavior of connective and other soft tissue: Secondary | ICD-10-CM | POA: Diagnosis not present

## 2016-04-14 DIAGNOSIS — M79652 Pain in left thigh: Secondary | ICD-10-CM | POA: Diagnosis not present

## 2016-04-14 DIAGNOSIS — G8928 Other chronic postprocedural pain: Secondary | ICD-10-CM

## 2016-04-14 DIAGNOSIS — M25561 Pain in right knee: Secondary | ICD-10-CM

## 2016-04-14 MED ORDER — GADOBENATE DIMEGLUMINE 529 MG/ML IV SOLN: 20.0000 mL | Freq: Once | INTRAVENOUS | Status: DC | PRN

## 2016-04-14 MED ORDER — FENTANYL 25 MCG/HR TD PT72: 25.0000 ug | MEDICATED_PATCH | TRANSDERMAL | 0 refills | Status: DC

## 2016-04-15 ENCOUNTER — Encounter: Payer: Self-pay | Admitting: General Surgery

## 2016-04-15 ENCOUNTER — Telehealth: Payer: Self-pay | Admitting: Nurse Practitioner

## 2016-04-15 NOTE — Telephone Encounter (Signed)
Left message to call back to schedule next apt with Dr. Alen Blew.

## 2016-04-15 NOTE — Progress Notes (Signed)
I had the pleasure to meet with Nathaniel Chambers and his wife today in clinic. He has now completed follow-up imaging of his pelvis, femurs and knees. He reports of the last 3 months he has noticed increased swelling inferior and medial to his left knee. He states this has been increasing in size, however is not associated with any pain or other symptoms. He has had overall decrease in edema in the left thigh but reports he still has chronic pain affecting the left posterior distal thigh that has been present since his surgery. He has been taking high-dose ibuprofen with moderate results in pain relief. He reports the pain is significant enough that by early afternoon at work he feels completely exhausted. He reports he is having increased knee pain on the right side with popping and pain on the medial side of his right knee. He did complete physical therapy for his left leg and reports he did improve his strength but his pain has persisted. Physical examination:  Focused physical examination of bilateral lower extremities reveals significant decrease in edema of the left thigh. There is still some residual post radiation and edema changes in the posterior lateral aspect of the left thigh most prominently in the distal portion. His incision is well-healed with no palpable masses or evidence of recurrence. He has tenderness to palpation in the left lateral distal left thigh. There is no associated mass in the area of tenderness. There is a 7 x 8 cm well-circumscribed minimally mobile rubbery mass located just distal of the left knee medially. There are no overlying skin changes and it is nontender. No other dominant or suspicious masses were noted on physical examination. Radiologic studies:  I spoke with the radiologist to [facility] radiology and they are in the process of doing a detailed review of all of the imaging and comparing it to the prior studies. The final report and images are not available for [place] to  review today in clinic. His initial reviewed does confirm what appears to be recurrence in the area of the palpable mass that we identified in clinic.  Assessment and plan: We first addressed the issues around pain. After an extended discussion about what has worked in the past and options available he is elected to try a fentanyl patch. We reviewed the risk and benefits of this patch including the need to completely remove the old patch prior to applying a new patch. He understands the risks associated with driving with this patch and if he is intolerant he can remove it and return back to his current pain regimen. He is interested in meeting with a interventional pain specialist to see if there are other options available for pain control.  We are planning to present his case at tumor multidisciplinary conference to review the images and outline treatment recommendations. I have tentatively recommended that we set up an appointment for him to meet with Dr. Alen Blew to discuss tamoxifen or other systemic agents in an effort to hold the disease and check rather than moving to surgery with additional radiation in an effort to avoid additional disability. He understands and agrees with the plan as outlined above. We'll be contacting him to discuss the results of tumor conference and setting up a referral to Dr. Alen Blew and the interventional pain specialist.

## 2016-04-17 ENCOUNTER — Telehealth: Payer: Self-pay | Admitting: Oncology

## 2016-04-17 ENCOUNTER — Telehealth: Payer: Self-pay | Admitting: Gynecologic Oncology

## 2016-04-17 ENCOUNTER — Telehealth: Payer: Self-pay

## 2016-04-17 NOTE — Telephone Encounter (Signed)
Pt notified of appointment with dr Alen Blew. Mr Turi stated that the [place] patch script was written for #45 and per his insurance he was only allowed #30.

## 2016-04-17 NOTE — Telephone Encounter (Signed)
Spoke with patient re 12/26 appointment

## 2016-04-17 NOTE — Telephone Encounter (Signed)
Spoke with the patient about [place] Conference discussion/recommendations this am.  Recommendation is to follow up with Dr. Alen Blew as scheduled on the 26th of Dec.  He will be referred to orthopedics for a radial tear of the meniscus in the right knee.  He states he saw a chiropractor last pm who discussed the scans with him as well and so he went ahead and contacted his orthopedic in [place] who did his previous surgery and has an appt with him on Dec 27 at 10:45am.  Discussed that there is an area in the right knee that will need to be monitored.  All questions answered.  Advised we would reach out to [facility] and follow up on the referral.  Advised he would be contacted with that date and time.  Advised to call for any needs or concerns.  Called and left a message with [place] about patient's referral.

## 2016-04-21 ENCOUNTER — Telehealth: Payer: Self-pay | Admitting: *Deleted

## 2016-04-21 ENCOUNTER — Telehealth: Payer: Self-pay | Admitting: Oncology

## 2016-04-21 ENCOUNTER — Ambulatory Visit: Payer: Commercial Managed Care - PPO | Admitting: Oncology

## 2016-04-21 NOTE — Telephone Encounter (Signed)
Patient called re rescheduling today's new patient appointments. Per patient took his medication and fell asleep - He didn't intend to miss his appointments but the medicine caused him to drift off.   Spoke with desk nurse and FS already gone for the day. Per desk nurse next available for now and she will talk to [facility] tomorrow.  Spoke with patient re appointment for 05/07/16 and made him aware will call re a sooner appointments if one is available after speaking with FS.

## 2016-04-21 NOTE — Telephone Encounter (Signed)
Patient calling to ask if he missed his appt. New patient appt was scheduled for today at 1:00 pm. Stated he is on some medicine and he forgot. States he can still come in today. [phone]

## 2016-04-22 ENCOUNTER — Telehealth: Payer: Self-pay | Admitting: Oncology

## 2016-04-22 NOTE — Telephone Encounter (Signed)
sw pt to confirm new appt date/time per FS for 12/28 at 1015 am

## 2016-04-22 NOTE — Telephone Encounter (Signed)
Message to scheduling was sent. I will see him on 12/28 at 10:00 am

## 2016-04-23 ENCOUNTER — Other Ambulatory Visit: Payer: Commercial Managed Care - PPO

## 2016-04-23 ENCOUNTER — Ambulatory Visit (HOSPITAL_BASED_OUTPATIENT_CLINIC_OR_DEPARTMENT_OTHER): Payer: Commercial Managed Care - PPO | Admitting: Oncology

## 2016-04-23 ENCOUNTER — Other Ambulatory Visit: Payer: Self-pay | Admitting: *Deleted

## 2016-04-23 VITALS — BP 151/88 | HR 81 | Temp 97.9°F | Resp 20 | Ht 74.0 in | Wt 344.5 lb

## 2016-04-23 DIAGNOSIS — G8929 Other chronic pain: Secondary | ICD-10-CM

## 2016-04-23 DIAGNOSIS — D481 Neoplasm of uncertain behavior of connective and other soft tissue: Secondary | ICD-10-CM

## 2016-04-23 MED ORDER — ONDANSETRON HCL 4 MG PO TABS: 4.0000 mg | ORAL_TABLET | Freq: Three times a day (TID) | ORAL | 2 refills | Status: AC | PRN

## 2016-04-23 MED ORDER — TAMOXIFEN CITRATE 20 MG PO TABS: 20.0000 mg | ORAL_TABLET | Freq: Every day | ORAL | 0 refills | Status: DC

## 2016-04-23 NOTE — Progress Notes (Signed)
Hematology and Oncology Follow Up Visit  Nathaniel Chambers [id number] [birth year] 50 y.o. 04/23/2016 10:41 AM No primary care provider on file.No ref. provider found   Principle Diagnosis: 50 year old gentleman with left lower extremity desmoid fibromatosis diagnosed in 2015. He presented with pain and MRI showed 2 separate masses involving the left thigh.   Prior Therapy: He underwent resection initially 2015 under the care of Dr. Leonides Schanz with positive margins. He underwent a reexcision in May 2016 with evidence of recurrence at that time. He received adjuvant external beam radiation between September and November 2016. He has been on active surveillance since that time    Current therapy: Observation and surveillance.  Interim History: Nathaniel Chambers today for a follow-up visit. He is a pleasant gentleman I saw in consultation in July 2017. Since that time, he had a repeat MRI on December 2017 which showed recurrence of his desmoid tumor. He also reported increase right-sided knee pain and MRI of that knee obtained on 04/14/2016. It did not show any evidence of disease but did show substantial meniscus and cartilage issues. He has been evaluated by interventional pain specialist for possible no block in the near future. Despite his pain, he is ambulating without any major difficulties. He was tried on a fentanyl patch but could not tolerate the side effects associated with it which including somnolence, lack of concentration and memory issues. He discontinued fentanyl at this time. He is able to drive and attends to activities of daily living.  He is not report any constitutional symptoms of fevers or chills or sweats. He does not report any headaches, blurry vision, syncope or seizures. He does not report any fevers or chills or sweats. He does not report any cough, wheezing or hemoptysis. He does not report any nausea, vomiting or abdominal pain. He does not report any frequency urgency or  hesitancy. Remainder review of systems unremarkable.   Medications: I have reviewed the patient's current medications.  Current Outpatient Prescriptions  Medication Sig Dispense Refill  . albuterol (PROVENTIL HFA;VENTOLIN HFA) 108 (90 BASE) MCG/ACT inhaler Inhale 2 puffs into the lungs every 6 (six) hours as needed for wheezing or shortness of breath (wheezing).     . celecoxib (CELEBREX) 100 MG capsule Take 1 capsule by mouth as directed.    . docusate sodium (COLACE) 100 MG capsule Take 100 mg by mouth 3 (three) times daily as needed for mild constipation.    . Fluticasone-Salmeterol (ADVAIR) 250-50 MCG/DOSE AEPB Inhale 1 puff into the lungs 2 (two) times daily.     Marland Kitchen gabapentin (NEURONTIN) 300 MG capsule TAKE 2 CAPSULES BY MOUTH EVERY MORNING AND 4 CAPSULES AT BEDTIME 180 capsule 5  . gabapentin (NEURONTIN) 600 MG tablet Take 2 tablets (1,200 mg total) by mouth at bedtime. 180 tablet 3  . ibuprofen (ADVIL,MOTRIN) 800 MG tablet Take 1 tablet (800 mg total) by mouth daily as needed. 90 tablet 0  . ondansetron (ZOFRAN) 4 MG tablet Take 1 tablet (4 mg total) by mouth every 8 (eight) hours as needed for nausea or vomiting. 30 tablet 2  . polyethylene glycol (MIRALAX / GLYCOLAX) packet Take 17 g by mouth daily. 14 each 0  . tapentadol (NUCYNTA) 50 MG tablet Take 1 tablet by mouth daily as needed.    . Vitamin D, Ergocalciferol, (DRISDOL) 50000 UNITS CAPS capsule Take 50,000 Units by mouth every 7 (seven) days. Days of week vary.     No current facility-administered medications for this visit.  Allergies:  Allergies  Allergen Reactions  . Penicillins Shortness Of Breath and Swelling    Swelling of uvula; tolerates augmentin  . Solu-Medrol [Methylprednisolone Acetate] Anaphylaxis  . Dilaudid [Hydromorphone Hcl] Nausea And Vomiting  . Oxycodone Nausea And Vomiting    Takes phenergan to alleviate symptoms  . Percocet [Oxycodone-Acetaminophen] Nausea And Vomiting    Past Medical History,  Surgical history, Social history, and Family History were reviewed and updated.  Physical Exam: Blood pressure (!) 151/88, pulse 81, temperature 97.9 F (36.6 C), temperature source Oral, resp. rate 20, height 6\' 2"  (1.88 m), weight (!) 344 lb 8 oz (156.3 kg), SpO2 99 %. ECOG: 0 General appearance: alert and cooperative Head: Normocephalic, without obvious abnormality Neck: no adenopathy Lymph nodes: Cervical, supraclavicular, and axillary nodes normal. Heart:regular rate and rhythm, S1, S2 normal, no murmur, click, rub or gallop Lung:chest clear, no wheezing, rales, normal symmetric air entry Abdomin: soft, non-tender, without masses or organomegaly EXT:no erythema, induration, or nodules   Lab Results: Lab Results  Component Value Date   WBC 10.9 (H) 08/11/2014   HGB 12.3 (L) 08/11/2014   HCT 38.7 (L) 08/11/2014   MCV 88.6 08/11/2014   PLT 286 08/11/2014     Chemistry      Component Value Date/Time   NA 137 08/10/2014 0410   K 4.0 08/10/2014 0410   CL 104 08/10/2014 0410   CO2 26 08/10/2014 0410   BUN 9 08/10/2014 0410   CREATININE 1.12 08/10/2014 0410      Component Value Date/Time   CALCIUM 8.7 08/10/2014 0410   ALKPHOS 50 06/16/2014 0500   AST 38 (H) 06/16/2014 0500   ALT 38 06/16/2014 0500   BILITOT 0.4 06/16/2014 0500      IMPRESSION: 1. Mass at the pes anserinus including the semitendinosus and gracilis tendons, total volume of lesion approximately 190 cubic cm, given the patient's history this probably represents another desmoid tumor. 2. Mild tricompartmental degenerative chondral thinning in the knee. 3. Incidental small posterior osteochondroma of the proximal tibial metaphysis.   IMPRESSION: 1. Radial tear posterior horn medial meniscus near the midbody. 2. Generally of moderate osteoarthritis although more striking in the medial compartment. 3. T2 signal prominence and mild expansion along the myotendinous junction of the popliteus tendon ;  wall this could simply be due to synovitis in the popliteus recess, given the contralateral findings I cannot completely exclude the possibility of an early/small desmoid tumor, and this may warrant surveillance. 4. Distal patellar tendon ossicle with adjacent abnormal linear signal in the tendon suggesting partial tearing  Radiological Studies:   Impression and Plan:   50 year old gentleman with the following issues:  1. Left leg desmoid fibromatosis diagnosed in 2015. He is status post resection on multiple occasions most recent of which in 2016 followed by adjuvant radiation therapy.   His most recent MRI in December 2017 showed new tumor noted on his left knee. He is case was discussed in the sarcoma multidisciplinary tumor board. Imaging studies were reviewed. His options would include surgical resection which we will leave his knee very unstable. Radiation therapy could be also an option and likely reserved as a salvage therapy.  Systemic therapy were discussed today with the patient. They would be reasonable to try the form of a systemic therapy to attempt to decrease the growth of this tumor. Multiple options have been investigated in the past including Cox 2 inhibitors as well as tamoxifen. Risks and benefits of tamoxifen were reviewed today including a dose  of 20 mg daily. These complications would include hot flashes, fluid retention and deep vein thrombosis. After discussion today he is agreeable to proceed at this time. I will assess him in 6 weeks to ensure tolerance and we'll repeat imaging studies in 3 months. Salvage therapy would include surgery versus radiation.  2. Chronic pain: He is under evaluation for a nerve block by interventional pain clinic. Fentanyl patch was discontinued.  3. Follow-up: Will be in 6 weeks to follow his progress.  [id number], MD 12/28/201710:41 AM

## 2016-04-27 DIAGNOSIS — S83209A Unspecified tear of unspecified meniscus, current injury, unspecified knee, initial encounter: Secondary | ICD-10-CM

## 2016-04-27 HISTORY — DX: Unspecified tear of unspecified meniscus, current injury, unspecified knee, initial encounter: S83.209A

## 2016-04-28 ENCOUNTER — Telehealth: Payer: Self-pay | Admitting: Oncology

## 2016-04-28 NOTE — Telephone Encounter (Signed)
left voicemail message to confirm appointment on 06/11/16 at 3:00pm

## 2016-05-07 ENCOUNTER — Ambulatory Visit: Payer: Commercial Managed Care - PPO | Admitting: Oncology

## 2016-05-12 ENCOUNTER — Encounter: Payer: BLUE CROSS/BLUE SHIELD | Attending: Surgery | Admitting: Skilled Nursing Facility1

## 2016-05-12 ENCOUNTER — Encounter: Payer: Self-pay | Admitting: Skilled Nursing Facility1

## 2016-05-12 DIAGNOSIS — Z79899 Other long term (current) drug therapy: Secondary | ICD-10-CM | POA: Insufficient documentation

## 2016-05-12 DIAGNOSIS — Z880 Allergy status to penicillin: Secondary | ICD-10-CM | POA: Diagnosis not present

## 2016-05-12 DIAGNOSIS — Z9884 Bariatric surgery status: Secondary | ICD-10-CM | POA: Diagnosis not present

## 2016-05-12 DIAGNOSIS — Z6841 Body Mass Index (BMI) 40.0 and over, adult: Secondary | ICD-10-CM | POA: Diagnosis not present

## 2016-05-12 DIAGNOSIS — Z7951 Long term (current) use of inhaled steroids: Secondary | ICD-10-CM | POA: Diagnosis not present

## 2016-05-12 DIAGNOSIS — Z888 Allergy status to other drugs, medicaments and biological substances status: Secondary | ICD-10-CM | POA: Diagnosis not present

## 2016-05-12 DIAGNOSIS — Z713 Dietary counseling and surveillance: Secondary | ICD-10-CM | POA: Insufficient documentation

## 2016-05-12 DIAGNOSIS — Z885 Allergy status to narcotic agent status: Secondary | ICD-10-CM | POA: Insufficient documentation

## 2016-05-12 DIAGNOSIS — E6609 Other obesity due to excess calories: Secondary | ICD-10-CM

## 2016-05-12 NOTE — Progress Notes (Signed)
  Pre-Op Assessment Visit:  Pre-Operative Sleeve Gastrectomy Surgery  Medical Nutrition Therapy:  Appt start time: 5:30  End time: 6:30  Patient was seen on 05/12/2016 for Pre-Operative Nutrition Assessment. Assessment and letter of approval faxed to [facility] Surgery Bariatric Surgery Program coordinator on 05/12/2016.  Pt states he has been very emotional lately due to his health and how his daughter treats him. Pt states he is ready for the surgery and to be healthy. Pt had a lot to vent and states he talked so much because he never gets a chance to talk at home. Pt has had a sigmoid colectomy.  Preferred Learning Style:   No preference indicated   Learning Readiness:   Change in progress  Handouts given during visit include:  Pre-Op Goals Bariatric Surgery Protein Shakes  During the appointment today the following Pre-Op Goals were reviewed with the patient: Maintain or lose weight as instructed by your surgeon Make healthy food choices Begin to limit portion sizes Limited concentrated sugars and fried foods Keep fat/sugar in the single digits per serving on food labels Practice CHEWING your food  (aim for 30 chews per bite or until applesauce consistency) Practice not drinking 15 minutes before, during, and 30 minutes after each meal/snack Avoid all carbonated beverages  Avoid/limit caffeinated beverages  Avoid all sugar-sweetened beverages Consume 3 meals per day; eat every 3-5 hours Make a list of non-food related activities Aim for 64-100 ounces of FLUID daily  Aim for at least 60-80 grams of PROTEIN daily Look for a liquid protein source that contain ?15 g protein and ?5 g carbohydrate  (ex: shakes, drinks, shots)  Patient-Centered Goals: 10/10 specific/non-scale and confidence/importance scale 1-10  Demonstrated degree of understanding via:  Teach Back  Teaching Method Utilized:  Visual Auditory Hands on  Barriers to learning/adherence to lifestyle  change: 10/10  Patient to call the [facility] to enroll in Pre-Op and Post-Op Nutrition Education when surgery date is scheduled.

## 2016-05-12 NOTE — Patient Instructions (Signed)
Follow Pre-Op Goals Try Protein Shakes Call [facility] at [phone] when surgery is scheduled to enroll in Pre-Op Class  Things to remember:  Please always be honest with us. We want to support you!  If you have any questions or concerns in between appointments, please call or email Liz, Leslie, or Laurie.  The diet after surgery will be high protein and low in carbohydrate.  Vitamins and calcium need to be taken for the rest of your life.  Feel free to include support people in any classes or appointments.   Supplement recommendations:  Before Surgery   1 Complete Multivitamin with Iron  3000 IU Vitamin D3  After Surgery   2 Chewable Multivitamins  **Best Choice - Bariatric Advantage Advanced Multi EA      3 Chewable Calcium (500 mg each, total 1200-1500 mg per day)  **Best Choice - Celebrate, Bariatric Advantage, or Wellesse  Other Options:    2 Flinstones Complete + up to 100 mg Thiamin + 2000-3000 IU Vitamin D3 + 350-500 mcg Vitamin B12 + 30-45 mg Iron (with history of deficiency)  2 Celebrate MultiComplete with 18 mg Iron (this provides 6000 IU of  Vitamin D3)  4 Celebrate Essential Multi 2 in 1 (has calcium) + 18-60 mg separate  iron  Vitamins and Calcium are available at:   [facility]   [address], [place], [place] [address]   [email or web address]  [email or web address]  [email or web address]   

## 2016-05-13 ENCOUNTER — Ambulatory Visit: Payer: Commercial Managed Care - PPO | Admitting: Skilled Nursing Facility1

## 2016-05-13 ENCOUNTER — Ambulatory Visit: Payer: Commercial Managed Care - PPO | Admitting: Dietician

## 2016-05-20 ENCOUNTER — Other Ambulatory Visit: Payer: Self-pay | Admitting: Oncology

## 2016-05-26 ENCOUNTER — Telehealth: Payer: Self-pay | Admitting: Gynecologic Oncology

## 2016-05-26 NOTE — Telephone Encounter (Signed)
Returned call to patient.  Patient had left message earlier today.  He is calling asking for a letter from Dr. Rolanda Jay and Dr. Alen Blew supporting the need for him to have bariatric surgery sooner than later.  He states his insurance company is trying to make him wait 6 months before having surgery and Dr. Hassell Done is going to try to perform a peer to peer to get it done sooner.  He states he has been taking the Tamoxifen but the tumor seems to be getting bigger and harder.  He also states he is in constant pain and was unable to tolerate the Fentanyl prescribed.  He stated the pain specialist he sees in [place] was going to attempt "to cut the nerve" to assist with pain control but his insurance denied it.   He would also like for his records from Dr. Valere Dross, Dr. Rolanda Jay, and Dr. Alen Blew be sent to Dr. Johnathan Hausen at [place].  Spoke with Dr. Rolanda Jay about the above.  Plan is to write the letter and mail to patient and fax to Dr. Hassell Done.  Will forward this message to Dr. Alen Blew as well.

## 2016-05-28 ENCOUNTER — Telehealth: Payer: Self-pay | Admitting: Neurology

## 2016-05-28 NOTE — Telephone Encounter (Signed)
Feb 8 at 3:30p.

## 2016-05-28 NOTE — Telephone Encounter (Signed)
Do you want to put him in somewhere?

## 2016-05-28 NOTE — Telephone Encounter (Signed)
PT called and wants to get back in to see Dr Posey Pronto but she has nothing until the 23rd of February but he is asking if she has an sooner appointments/Dawn  CB# [phone]

## 2016-06-04 ENCOUNTER — Ambulatory Visit (INDEPENDENT_AMBULATORY_CARE_PROVIDER_SITE_OTHER): Payer: BLUE CROSS/BLUE SHIELD | Admitting: Neurology

## 2016-06-04 ENCOUNTER — Encounter: Payer: Self-pay | Admitting: Neurology

## 2016-06-04 VITALS — BP 156/88 | HR 88 | Ht 74.0 in | Wt 346.2 lb

## 2016-06-04 DIAGNOSIS — G5710 Meralgia paresthetica, unspecified lower limb: Secondary | ICD-10-CM

## 2016-06-04 DIAGNOSIS — M792 Neuralgia and neuritis, unspecified: Secondary | ICD-10-CM

## 2016-06-04 DIAGNOSIS — R208 Other disturbances of skin sensation: Secondary | ICD-10-CM | POA: Diagnosis not present

## 2016-06-04 MED ORDER — GABAPENTIN 600 MG PO TABS: 1200.0000 mg | ORAL_TABLET | Freq: Every day | ORAL | 3 refills | Status: DC

## 2016-06-04 MED ORDER — NORTRIPTYLINE HCL 10 MG PO CAPS: ORAL_CAPSULE | ORAL | 5 refills | Status: AC

## 2016-06-04 MED ORDER — GABAPENTIN 300 MG PO CAPS: 900.0000 mg | ORAL_CAPSULE | Freq: Every day | ORAL | 5 refills | Status: AC

## 2016-06-04 NOTE — Patient Instructions (Addendum)
1.  Start nortriptyline 10mg  at bedtime for 2 week, then increase to 2 tablet at bedtime 2.  Continue gabapentin as you are taking 3.  Call my office when you would like to be set up for nerve testing of the right leg  Return to clinic in 6 months

## 2016-06-04 NOTE — Progress Notes (Signed)
Follow-up Visit   Date: 06/04/16    Nathaniel Chambers MRN: [id number] DOB: March 17, 1966   Interim History: Nathaniel Chambers is a 51 y.o. 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      History of present illness: Since 2014, he started feeling an abnormality behind his thigh and was eventually found to have found to have a mass involving the posterior thigh. 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 Gwyndolyn Saxon Ward in [place]. A few days following surgery, he developed numbness involving the RIGHT anterolateral thigh. Symptoms have worsened over the past several months and now he has extreme sensitivity with sharp pain involving the left thigh. Light pressure, air, and even bed sheets causes severe pain. He tried on amitriptyline and Lyrica which did not help pain, but made him sleepy. He was taking ibuprofen 600mg  following surgery for a brief time, but since stopping it, he feels that pain may have worsened. He also reports having sharp pain involving the left thigh around mid-May. Prior to surgery, he had no numbness/tingling of the thighs. He has gained 30lb following surgery.   UPDATE 03/30/2014:  Since starting gabapentin 600mg  qhs, he noticed mild improvement of pain but he reports getting very lightheaded with it so is only taking it at night.  From an oncology stand-point, he was recently re-evaluated and there has been no reoccurrence.  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  He is staying very active and trying to loose weight.  Aspercream helps more than lidocaine ointment.  UPDATE 07/12/2015:  Since his last visit, he underwent colon resection for diverticulitis, recurrence of his desmoid  tumor in the left thigh which he has resected in June followed radiation therapy.  In November, his employer of 14 years moved the company elsewhere and he lost his job.  Needless to say, he has had a rough 2016.   Around August 2016, he started increasing his gabapentin because of worsening painful paresthesias of the legs.  He is currently taking gabapentin 1200mg  at bedtime.  His symptoms are well controlled at nighttime, but by 8am they start to bother him again.  He has some left leg weakness and gait instability due to scar tissue.    UPDATE 06/04/2016:  Nathaniel Chambers is here for 1 year follow-up appointment.  Unfortunately, his imaging shows recurrence of desmoid tumor involving the left biceps femoris, semitendinosus, and semimembranosus muscles, as well as mild edema of the left sciatic nerve in the thigh.  He was started on tamoxifen in hopes to reduce the size and will be seeing oncology in a few weeks for follow-up.  Starting around Spring 2017, he began having a sensation of his toes throbbing and swollen.  He toes feel numb, there is no burning or tingling.  He complains of knee pain and has fallen twice, because of knee buckling.  He is hoping to get approved for bariatric surgery so he can have knee surgery for a torn meniscus.  He continues to have pain related to meralgia paresthetica and takes gabapentin 2100mg  at bedtime.    Medications:  Current Outpatient Prescriptions on File Prior to Visit  Medication Sig Dispense Refill  . albuterol (PROVENTIL HFA;VENTOLIN HFA) 108 (90 BASE) MCG/ACT inhaler Inhale 2  puffs into the lungs every 6 (six) hours as needed for wheezing or shortness of breath (wheezing).     . celecoxib (CELEBREX) 100 MG capsule Take 1 capsule by mouth as directed.    . docusate sodium (COLACE) 100 MG capsule Take 100 mg by mouth 3 (three) times daily as needed for mild constipation.    . Fluticasone-Salmeterol (ADVAIR) 250-50 MCG/DOSE AEPB Inhale 1 puff into the lungs 2 (two)  times daily.     Marland Kitchen gabapentin (NEURONTIN) 300 MG capsule TAKE 2 CAPSULES BY MOUTH EVERY MORNING AND 4 CAPSULES AT BEDTIME 180 capsule 5  . gabapentin (NEURONTIN) 600 MG tablet Take 2 tablets (1,200 mg total) by mouth at bedtime. 180 tablet 3  . ibuprofen (ADVIL,MOTRIN) 800 MG tablet Take 1 tablet (800 mg total) by mouth daily as needed. 90 tablet 0  . ondansetron (ZOFRAN) 4 MG tablet Take 1 tablet (4 mg total) by mouth every 8 (eight) hours as needed for nausea or vomiting. 30 tablet 2  . polyethylene glycol (MIRALAX / GLYCOLAX) packet Take 17 g by mouth daily. 14 each 0  . tamoxifen (NOLVADEX) 20 MG tablet TAKE 1 TABLET (20 MG TOTAL) BY MOUTH DAILY. 60 tablet 0  . Vitamin D, Ergocalciferol, (DRISDOL) 50000 UNITS CAPS capsule Take 50,000 Units by mouth every 7 (seven) days. Days of week vary.     No current facility-administered medications on file prior to visit.     Allergies:  Allergies  Allergen Reactions  . Penicillins Shortness Of Breath and Swelling    Swelling of uvula; tolerates augmentin  . Solu-Medrol [Methylprednisolone Acetate] Anaphylaxis  . Dilaudid [Hydromorphone Hcl] Nausea And Vomiting  . Oxycodone Nausea And Vomiting    Takes phenergan to alleviate symptoms  . Percocet [Oxycodone-Acetaminophen] Nausea And Vomiting    Review of Systems:  CONSTITUTIONAL: No fevers, chills, night sweats, or weight loss.  EYES: No visual changes or eye pain ENT: No hearing changes.  No history of nose bleeds.   RESPIRATORY: No cough, wheezing and shortness of breath.   CARDIOVASCULAR: Negative for chest pain, and palpitations.   GI: Negative for abdominal discomfort, blood in stools or black stools.  No recent change in bowel habits.   GU:  No history of incontinence.   MUSCLOSKELETAL: No history of joint pain or swelling.  No myalgias.   SKIN: Negative for lesions, rash, and itching.   ENDOCRINE: Negative for cold or heat intolerance, polydipsia or goiter.   PSYCH:  No depression or  anxiety symptoms.   NEURO: As Above.   Vital Signs:  BP (!) 156/88   Pulse 88   Ht 6\' 2"  (1.88 m)   Wt (!) 346 lb 3.2 oz (157 kg)   SpO2 97%   BMI 44.45 kg/m   Neurological Exam: MENTAL STATUS including orientation to time, place, and person is normal.  Speech is not dysarthric.  CRANIAL NERVES: Pupils equal round and reactive to light.  No ptosis.  Face is symmetric. Palate elevates symmetrically.  MOTOR:  Motor strength is 5/5 in all extremities, except LLE 5-/5.  Tone is normal.    MSRs:  Reflexes are 2+/4 throughout, except 1+ Achilles bilaterally.  SENSORY: Diminished sensation to light touch over the anterolateral thigh (R >L).  Sensation to pin prick, vibration, temperature intact in the feet bilaterally.  COORDINATION/GAIT:    Gait narrow based and stable, assisted with cane.  Data: MRA head 10/31/2008: 1. 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  2. No occlusions,  dissections or aneurysms seen on the study.  MRI brain wo contrast 10/31/2008: 1. No evidence of acute ischemia.  2. Abnormal marrow signal involving the upper one third of the clivus. Differential considerations include marrow changes related  to anemia, smoking, or a neoplastic infiltrative process. Clinical correlation and further evaluation with a dedicated CT scan through the cranial skull base is suggested.  3. Moderate inflammatory thickening of the mucosa in the ethmoids and the maxillary sinuses.  MRI left femur 04/14/2016: 1. There is a similar degree of fatty atrophy of the biceps femoris, semitendinosus, and semimembranosus in the left thigh with infiltrative low-grade enhancement in these muscles but without a masslike component an without significant progression compared to 09/02/15. 2. Within the left thigh, there is some low-level edema and expansion in the left sciatic nerve compared to the right, potentially reflecting low-level inflammation. 3. In the  pelvis there is similar low-level infiltrative edema and subtle enhancement in parts of the left gluteus maximus muscle and also in the left hip adductor musculature. Some of this may be related to prior external beam therapy or related to the gait abnormalities. 4. Small bilateral indirect inguinal hernias contain adipose tissue.   Lab Results  Component Value Date   TSH 1.120 04/04/2014   Lab Results  Component Value Date   HGBA1C 5.3 07/30/2014    IMPRESSION/PLAN: Nathaniel Chambers is a 51 year-old gentleman with recurrence of desmoid tumor involving the left leg and thigh.  He has previously seen me for meralgia paresthetica which has been refractory to pain medication and returns today for follow-up.  Unfortunately, he continues to have severe burning pain over the thighs, despite taking gabapentin 2100mg /d.  He previously tried Lyrica, amitriptyline, and lidocaine without benefit.  Weigh loss is very important part of management for this condition and he would benefit from weight loss surgery.  In the meantime, we can try nortriptyline 10mg  at bedtime for 2 week, then increase to 2 tablet at bedtime.  He may consider lateral femoral cutaneous nerve block by pain management going forward.  Regarding his feet dysesthesias, he complains of swelling sensation but I cannot appreciate and frank edema of the feet. He can try to reduce gabapentin by 300mg /week down to 1200mg /d and see if these alleviates his symptoms.  Alternatively peripheral neuropathy is possible, but his his exam shows normal sensation and motor strength.  NCS/EMG of the right leg can be done going forward to better characterize the nature of his symptoms.  He had many questions regarding management of his pain, including knee pain and inner thigh pain.  It was explained that musculoskeletal and cancer pain are beyond the realm of my expertise and I would defer this to his respective providers.  Return to clinic in 2 months   The  duration of this appointment visit was 40 minutes of face-to-face time with the patient.  Greater than 50% of this time was spent in counseling, explanation of diagnosis, planning of further management, and coordination of care.   Thank you for allowing me to participate in patient's care.  If I can answer any additional questions, I would be pleased to do so.    Sincerely,    Waunetta Riggle K. Posey Pronto, DO

## 2016-06-11 ENCOUNTER — Ambulatory Visit: Payer: Commercial Managed Care - PPO | Admitting: Oncology

## 2016-06-11 ENCOUNTER — Telehealth: Payer: Self-pay | Admitting: *Deleted

## 2016-06-11 NOTE — Telephone Encounter (Signed)
Called patient. He forgot his appt today with dr Alen Blew. Los to schedulers to re-schedule for first available.

## 2016-06-12 ENCOUNTER — Telehealth: Payer: Self-pay | Admitting: Oncology

## 2016-06-12 NOTE — Telephone Encounter (Signed)
lvm to inform pt of r/s appt 2/27 at 1045 per LOS

## 2016-06-15 ENCOUNTER — Telehealth: Payer: Self-pay | Admitting: Oncology

## 2016-06-15 NOTE — Telephone Encounter (Signed)
Patient called and moved f/u from 2/27 to 3/16. Patient has new date/time

## 2016-06-16 ENCOUNTER — Ambulatory Visit (INDEPENDENT_AMBULATORY_CARE_PROVIDER_SITE_OTHER): Payer: BLUE CROSS/BLUE SHIELD | Admitting: Psychiatry

## 2016-06-16 DIAGNOSIS — F509 Eating disorder, unspecified: Secondary | ICD-10-CM

## 2016-06-19 ENCOUNTER — Telehealth: Payer: Self-pay

## 2016-06-19 NOTE — Telephone Encounter (Signed)
Pt called stating Dr Rolanda Jay had written a letter to Dr Johnathan Hausen pertaining to his bariatric surgery. Surgery is scheduled for March 3. Pt is requesting a copy of the letter for his HR department. He was wanting it by 430 today. He was unable to get anyone on the phone at [facility] [place] [facility].  I called [facility] requesting the letter. HIM said they would let RN know and they would contact pt about this.  Pt e-mail is [email or web address]. He does not have fax number for his HR. Or wife could pick up a copy of the letter.  S/w gyn onc and they are looking for letter as well.  Called pt back with information.

## 2016-06-19 NOTE — Telephone Encounter (Signed)
[facility] called and stated they are emailing letter to pt now.

## 2016-06-22 NOTE — Progress Notes (Signed)
Need orders in epic for 3-6- surgery asap

## 2016-06-23 ENCOUNTER — Ambulatory Visit: Payer: BLUE CROSS/BLUE SHIELD | Admitting: Oncology

## 2016-06-24 NOTE — Patient Instructions (Addendum)
Nathaniel Chambers  06/24/2016   Your procedure is scheduled on: 07/01/2016    Report to [facility] Main  Entrance take [place]  elevators to 3rd floor to  [place] at    0800 AM.  Call this number if you have problems the morning of surgery [phone]   Remember: ONLY 1 PERSON MAY GO WITH YOU TO SHORT STAY TO GET  READY MORNING OF [place].  Do not eat food or drink liquids :After Midnight.     Take these medicines the morning of surgery with A SIP OF WATER: Albuterol Inhaler if needed and bring, Advair Inhaler and bring                                You may not have any metal on your body including hair pins and              piercings  Do not wear jewelry,  lotions, powders or perfumes, deodorant                         Men may shave face and neck.   Do not bring valuables to the hospital. [place].  Contacts, dentures or bridgework may not be worn into surgery.  Leave suitcase in the car. After surgery it may be brought to your room.               Please read over the following fact sheets you were given: _____________________________________________________________________             [facility] - Preparing for Surgery Before surgery, you can play an important role.  Because skin is not sterile, your skin needs to be as free of germs as possible.  You can reduce the number of germs on your skin by washing with CHG (chlorahexidine gluconate) soap before surgery.  CHG is an antiseptic cleaner which kills germs and bonds with the skin to continue killing germs even after washing. Please DO NOT use if you have an allergy to CHG or antibacterial soaps.  If your skin becomes reddened/irritated stop using the CHG and inform your nurse when you arrive at Short Stay. Do not shave (including legs and underarms) for at least 48 hours prior to the first CHG shower.  You may shave your face/neck. Please  follow these instructions carefully:  1.  Shower with CHG Soap the night before surgery and the  morning of Surgery.  2.  If you choose to wash your hair, wash your hair first as usual with your  normal  shampoo.  3.  After you shampoo, rinse your hair and body thoroughly to remove the  shampoo.                           4.  Use CHG as you would any other liquid soap.  You can apply chg directly  to the skin and wash                       Gently with a scrungie or clean washcloth.  5.  Apply the CHG Soap to your body ONLY FROM  THE NECK DOWN.   Do not use on face/ open                           Wound or open sores. Avoid contact with eyes, ears mouth and genitals (private parts).                       Wash face,  Genitals (private parts) with your normal soap.             6.  Wash thoroughly, paying special attention to the area where your surgery  will be performed.  7.  Thoroughly rinse your body with warm water from the neck down.  8.  DO NOT shower/wash with your normal soap after using and rinsing off  the CHG Soap.                9.  Pat yourself dry with a clean towel.            10.  Wear clean pajamas.            11.  Place clean sheets on your bed the night of your first shower and do not  sleep with pets. Day of Surgery : Do not apply any lotions/deodorants the morning of surgery.  Please wear clean clothes to the hospital/surgery center.  FAILURE TO FOLLOW THESE INSTRUCTIONS MAY RESULT IN THE CANCELLATION OF YOUR SURGERY PATIENT SIGNATURE_________________________________  NURSE SIGNATURE__________________________________  ________________________________________________________________________

## 2016-06-25 ENCOUNTER — Ambulatory Visit (INDEPENDENT_AMBULATORY_CARE_PROVIDER_SITE_OTHER): Payer: BLUE CROSS/BLUE SHIELD | Admitting: Psychiatry

## 2016-06-25 DIAGNOSIS — F509 Eating disorder, unspecified: Secondary | ICD-10-CM

## 2016-06-26 ENCOUNTER — Encounter (HOSPITAL_COMMUNITY): Payer: Self-pay

## 2016-06-26 ENCOUNTER — Encounter (HOSPITAL_COMMUNITY)
Admission: RE | Admit: 2016-06-26 | Discharge: 2016-06-26 | Disposition: A | Discharge status: Home / Self Care | Payer: BLUE CROSS/BLUE SHIELD | Source: Ambulatory Visit | Attending: Surgery | Admitting: Surgery

## 2016-06-26 DIAGNOSIS — I4510 Unspecified right bundle-branch block: Secondary | ICD-10-CM | POA: Diagnosis not present

## 2016-06-26 DIAGNOSIS — Z01818 Encounter for other preprocedural examination: Secondary | ICD-10-CM | POA: Insufficient documentation

## 2016-06-26 DIAGNOSIS — I10 Essential (primary) hypertension: Secondary | ICD-10-CM | POA: Insufficient documentation

## 2016-06-26 DIAGNOSIS — Z6841 Body Mass Index (BMI) 40.0 and over, adult: Secondary | ICD-10-CM | POA: Diagnosis not present

## 2016-06-26 DIAGNOSIS — Z01812 Encounter for preprocedural laboratory examination: Secondary | ICD-10-CM | POA: Insufficient documentation

## 2016-06-26 HISTORY — DX: Unspecified tear of unspecified meniscus, current injury, unspecified knee, initial encounter: S83.209A

## 2016-06-26 HISTORY — DX: Sleep apnea, unspecified: G47.30

## 2016-06-26 HISTORY — DX: Essential (primary) hypertension: I10

## 2016-06-26 LAB — BASIC METABOLIC PANEL
Anion gap: 8 (ref 5–15)
BUN: 13 mg/dL (ref 6–20)
CALCIUM: 9.5 mg/dL (ref 8.9–10.3)
CO2: 26 mmol/L (ref 22–32)
CREATININE: 1.21 mg/dL (ref 0.61–1.24)
Chloride: 104 mmol/L (ref 101–111)
GFR calc non Af Amer: >60 mL/min (ref >60)
Glucose, Bld: 98 mg/dL (ref 65–99)
Potassium: 4 mmol/L (ref 3.5–5.1)
SODIUM: 138 mmol/L (ref 135–145)

## 2016-06-26 LAB — CBC
HCT: 43.4 % (ref 39.0–52.0)
Hemoglobin: 14.6 g/dL (ref 13.0–17.0)
MCH: 29.3 pg (ref 26.0–34.0)
MCHC: 33.6 g/dL (ref 30.0–36.0)
MCV: 87 fL (ref 78.0–100.0)
Platelets: 286 10*3/uL (ref 150–400)
RBC: 4.99 MIL/uL (ref 4.22–5.81)
RDW: 13.4 % (ref 11.5–15.5)
WBC: 5.8 10*3/uL (ref 4.0–10.5)

## 2016-06-26 NOTE — Pre-Procedure Instructions (Signed)
Pt does not have pre-op orders for todays pre-op appt.  Please put in asap.  Appointment is starting 0800 today. Thank you.

## 2016-06-29 ENCOUNTER — Telehealth (HOSPITAL_COMMUNITY): Payer: Self-pay

## 2016-06-29 NOTE — Telephone Encounter (Signed)
Preop education provided to Nathaniel Chambers via telephone as patient unable to attend preop class.  We discussed information from preop class that allowed for teachback.  Nathaniel Chambers had a understanding of his current pre op diet, his operative day flow, post op diet and progression, along with discharge diet.  We discussed current medications.  Patient has been taking Celebrex last dose 06-28-16.  Patient advised to stop medication and Dr Hassell Done made aware that patient was taking medication via text message and told to stop med until further notice. Patient asked questioned and appropriately provided information back to the nurse.  Phone number provided to patient to follow up with any questions.

## 2016-06-30 ENCOUNTER — Encounter: Payer: BLUE CROSS/BLUE SHIELD | Attending: Surgery | Admitting: Skilled Nursing Facility1

## 2016-06-30 ENCOUNTER — Encounter: Payer: Self-pay | Admitting: Skilled Nursing Facility1

## 2016-06-30 DIAGNOSIS — Z885 Allergy status to narcotic agent status: Secondary | ICD-10-CM | POA: Insufficient documentation

## 2016-06-30 DIAGNOSIS — Z888 Allergy status to other drugs, medicaments and biological substances status: Secondary | ICD-10-CM | POA: Diagnosis not present

## 2016-06-30 DIAGNOSIS — Z713 Dietary counseling and surveillance: Secondary | ICD-10-CM | POA: Diagnosis not present

## 2016-06-30 DIAGNOSIS — Z9884 Bariatric surgery status: Secondary | ICD-10-CM | POA: Diagnosis not present

## 2016-06-30 DIAGNOSIS — Z880 Allergy status to penicillin: Secondary | ICD-10-CM | POA: Diagnosis not present

## 2016-06-30 DIAGNOSIS — Z79899 Other long term (current) drug therapy: Secondary | ICD-10-CM | POA: Insufficient documentation

## 2016-06-30 DIAGNOSIS — Z6841 Body Mass Index (BMI) 40.0 and over, adult: Secondary | ICD-10-CM | POA: Insufficient documentation

## 2016-06-30 DIAGNOSIS — Z7951 Long term (current) use of inhaled steroids: Secondary | ICD-10-CM | POA: Insufficient documentation

## 2016-06-30 DIAGNOSIS — E669 Obesity, unspecified: Secondary | ICD-10-CM

## 2016-06-30 NOTE — Progress Notes (Signed)
  Pre-Op Assessment Visit:  Pre-Operative Sleeve Gastrectomy Surgery  Medical Nutrition Therapy:  Appt start time: 5:30  End time: 6:30  Patient was seen on 05/12/2016 for Pre-Operative Nutrition Assessment. Assessment and letter of approval faxed to [facility] Bariatric Surgery Program coordinator on 05/12/2016.  Pt states he has been very emotional lately due to his health and how his daughter treats him. Pt states he is ready for the surgery and to be healthy. Pt had a lot to vent and states he talked so much because he never gets a chance to talk at home. Pt has had a sigmoid colectomy.  Pt states he has been on a liquid diet for 2 weeks: premier protein shakes and eggs here and there as well as chicken breast and broccoli. Pt was educated on the post op diet and answered all of his questions.    Preferred Learning Style:   No preference indicated   Learning Readiness:   Change in progress  Handouts given during visit include:  Pre-Op Goals Bariatric Surgery Protein Shakes  During the appointment today the following Pre-Op Goals were reviewed with the patient: Maintain or lose weight as instructed by your surgeon Make healthy food choices Begin to limit portion sizes Limited concentrated sugars and fried foods Keep fat/sugar in the single digits per serving on food labels Practice CHEWING your food  (aim for 30 chews per bite or until applesauce consistency) Practice not drinking 15 minutes before, during, and 30 minutes after each meal/snack Avoid all carbonated beverages  Avoid/limit caffeinated beverages  Avoid all sugar-sweetened beverages Consume 3 meals per day; eat every 3-5 hours Make a list of non-food related activities Aim for 64-100 ounces of FLUID daily  Aim for at least 60-80 grams of PROTEIN daily Look for a liquid protein source that contain ?15 g protein and ?5 g carbohydrate  (ex: shakes, drinks, shots)  Patient-Centered Goals: 10/10  specific/non-scale and confidence/importance scale 1-10  Demonstrated degree of understanding via:  Teach Back  Teaching Method Utilized:  Visual Auditory Hands on  Barriers to learning/adherence to lifestyle change: 10/10  Patient to [facility] to enroll in Pre-Op and Post-Op Nutrition Education when surgery date is scheduled.

## 2016-07-01 ENCOUNTER — Inpatient Hospital Stay (HOSPITAL_COMMUNITY): Payer: BLUE CROSS/BLUE SHIELD | Admitting: Certified Registered Nurse Anesthetist

## 2016-07-01 ENCOUNTER — Inpatient Hospital Stay (HOSPITAL_COMMUNITY)
Admission: RE | Admit: 2016-07-01 | Discharge: 2016-07-04 | DRG: 908 | Disposition: A | Discharge status: Home / Self Care | Payer: BLUE CROSS/BLUE SHIELD | Source: Ambulatory Visit | Attending: Surgery | Admitting: Surgery

## 2016-07-01 ENCOUNTER — Ambulatory Visit: Payer: Self-pay | Admitting: Surgery

## 2016-07-01 ENCOUNTER — Encounter (HOSPITAL_COMMUNITY): Payer: Self-pay | Admitting: *Deleted

## 2016-07-01 ENCOUNTER — Encounter (HOSPITAL_COMMUNITY)
Admission: RE | Disposition: A | Discharge status: Home / Self Care | Payer: Self-pay | Source: Ambulatory Visit | Attending: Surgery

## 2016-07-01 DIAGNOSIS — Z880 Allergy status to penicillin: Secondary | ICD-10-CM | POA: Diagnosis not present

## 2016-07-01 DIAGNOSIS — Z9884 Bariatric surgery status: Secondary | ICD-10-CM

## 2016-07-01 DIAGNOSIS — Z6841 Body Mass Index (BMI) 40.0 and over, adult: Secondary | ICD-10-CM

## 2016-07-01 DIAGNOSIS — G4730 Sleep apnea, unspecified: Secondary | ICD-10-CM | POA: Diagnosis present

## 2016-07-01 DIAGNOSIS — M9250 Juvenile osteochondrosis of tibia and fibula, unspecified leg: Secondary | ICD-10-CM | POA: Diagnosis present

## 2016-07-01 DIAGNOSIS — Z87891 Personal history of nicotine dependence: Secondary | ICD-10-CM | POA: Diagnosis not present

## 2016-07-01 DIAGNOSIS — Z808 Family history of malignant neoplasm of other organs or systems: Secondary | ICD-10-CM | POA: Diagnosis not present

## 2016-07-01 DIAGNOSIS — Z888 Allergy status to other drugs, medicaments and biological substances status: Secondary | ICD-10-CM | POA: Diagnosis not present

## 2016-07-01 DIAGNOSIS — Z8051 Family history of malignant neoplasm of kidney: Secondary | ICD-10-CM

## 2016-07-01 DIAGNOSIS — G5713 Meralgia paresthetica, bilateral lower limbs: Secondary | ICD-10-CM | POA: Diagnosis present

## 2016-07-01 DIAGNOSIS — M728 Other fibroblastic disorders: Secondary | ICD-10-CM | POA: Diagnosis present

## 2016-07-01 DIAGNOSIS — D481 Neoplasm of uncertain behavior of connective and other soft tissue: Secondary | ICD-10-CM | POA: Diagnosis present

## 2016-07-01 DIAGNOSIS — I10 Essential (primary) hypertension: Secondary | ICD-10-CM | POA: Diagnosis present

## 2016-07-01 DIAGNOSIS — D1622 Benign neoplasm of long bones of left lower limb: Secondary | ICD-10-CM | POA: Diagnosis present

## 2016-07-01 DIAGNOSIS — T85518A Breakdown (mechanical) of other gastrointestinal prosthetic devices, implants and grafts, initial encounter: Secondary | ICD-10-CM | POA: Diagnosis present

## 2016-07-01 DIAGNOSIS — Z885 Allergy status to narcotic agent status: Secondary | ICD-10-CM | POA: Diagnosis not present

## 2016-07-01 HISTORY — PX: LAPAROSCOPIC GASTRIC BAND REMOVAL WITH LAPAROSCOPIC GASTRIC SLEEVE RESECTION: SHX6498

## 2016-07-01 LAB — HEMOGLOBIN AND HEMATOCRIT, BLOOD
HEMATOCRIT: 44.5 % (ref 39.0–52.0)
HEMOGLOBIN: 15 g/dL (ref 13.0–17.0)

## 2016-07-01 SURGERY — LAPAROSCOPIC GASTRIC BAND REMOVAL WITH LAPAROSCOPIC GASTRIC SLEEVE RESECTION
Anesthesia: General

## 2016-07-01 MED ORDER — ACETAMINOPHEN 500 MG PO TABS
1000.0000 mg | ORAL_TABLET | ORAL | Status: AC
  Administered 2016-07-01: 1000 mg via ORAL
  Filled 2016-07-01: qty 2

## 2016-07-01 MED ORDER — PROMETHAZINE HCL 25 MG/ML IJ SOLN: 6.2500 mg | INTRAMUSCULAR | Status: DC | PRN

## 2016-07-01 MED ORDER — SUGAMMADEX SODIUM 500 MG/5ML IV SOLN
INTRAVENOUS | Status: AC
  Filled 2016-07-01: qty 5

## 2016-07-01 MED ORDER — ONDANSETRON HCL 4 MG/2ML IJ SOLN
INTRAMUSCULAR | Status: AC
  Filled 2016-07-01: qty 2

## 2016-07-01 MED ORDER — ACETAMINOPHEN 325 MG PO TABS
650.0000 mg | ORAL_TABLET | ORAL | Status: DC | PRN
  Filled 2016-07-01: qty 2

## 2016-07-01 MED ORDER — BUPIVACAINE LIPOSOME 1.3 % IJ SUSP
20.0000 mL | Freq: Once | INTRAMUSCULAR | Status: AC
  Administered 2016-07-01: 20 mL
  Filled 2016-07-01: qty 20

## 2016-07-01 MED ORDER — PHENYLEPHRINE HCL 10 MG/ML IJ SOLN
INTRAMUSCULAR | Status: DC | PRN
  Administered 2016-07-01 (×5): 80 ug via INTRAVENOUS

## 2016-07-01 MED ORDER — ROCURONIUM BROMIDE 50 MG/5ML IV SOSY
PREFILLED_SYRINGE | INTRAVENOUS | Status: AC
  Filled 2016-07-01: qty 5

## 2016-07-01 MED ORDER — PHENYLEPHRINE 40 MCG/ML (10ML) SYRINGE FOR IV PUSH (FOR BLOOD PRESSURE SUPPORT)
PREFILLED_SYRINGE | INTRAVENOUS | Status: AC
  Filled 2016-07-01: qty 10

## 2016-07-01 MED ORDER — FENTANYL CITRATE (PF) 100 MCG/2ML IJ SOLN: 25.0000 ug | INTRAMUSCULAR | Status: DC | PRN

## 2016-07-01 MED ORDER — SUCCINYLCHOLINE CHLORIDE 200 MG/10ML IV SOSY
PREFILLED_SYRINGE | INTRAVENOUS | Status: AC
  Filled 2016-07-01: qty 10

## 2016-07-01 MED ORDER — ONDANSETRON HCL 4 MG/2ML IJ SOLN
4.0000 mg | INTRAMUSCULAR | Status: DC | PRN
  Administered 2016-07-01 – 2016-07-04 (×10): 4 mg via INTRAVENOUS
  Filled 2016-07-01 (×10): qty 2

## 2016-07-01 MED ORDER — SUCCINYLCHOLINE CHLORIDE 200 MG/10ML IV SOSY
PREFILLED_SYRINGE | INTRAVENOUS | Status: DC | PRN
  Administered 2016-07-01: 140 mg via INTRAVENOUS

## 2016-07-01 MED ORDER — SUGAMMADEX SODIUM 200 MG/2ML IV SOLN
INTRAVENOUS | Status: DC | PRN
  Administered 2016-07-01: 400 mg via INTRAVENOUS

## 2016-07-01 MED ORDER — EPHEDRINE SULFATE 50 MG/ML IJ SOLN
INTRAMUSCULAR | Status: DC | PRN
  Administered 2016-07-01 (×3): 10 mg via INTRAVENOUS

## 2016-07-01 MED ORDER — GABAPENTIN 300 MG PO CAPS
300.0000 mg | ORAL_CAPSULE | ORAL | Status: AC
  Administered 2016-07-01: 300 mg via ORAL
  Filled 2016-07-01: qty 1

## 2016-07-01 MED ORDER — LACTATED RINGERS IR SOLN
Status: DC | PRN
  Administered 2016-07-01: 1

## 2016-07-01 MED ORDER — APREPITANT 40 MG PO CAPS
40.0000 mg | ORAL_CAPSULE | ORAL | Status: AC
  Administered 2016-07-01: 40 mg via ORAL
  Filled 2016-07-01: qty 1

## 2016-07-01 MED ORDER — KETAMINE HCL 10 MG/ML IJ SOLN
INTRAMUSCULAR | Status: DC | PRN
  Administered 2016-07-01: 40 mg via INTRAVENOUS

## 2016-07-01 MED ORDER — PHENYLEPHRINE HCL 10 MG/ML IJ SOLN
INTRAMUSCULAR | Status: AC
  Filled 2016-07-01: qty 1

## 2016-07-01 MED ORDER — OXYCODONE HCL 5 MG/5ML PO SOLN: 5.0000 mg | ORAL | Status: DC | PRN

## 2016-07-01 MED ORDER — LIDOCAINE 2% (20 MG/ML) 5 ML SYRINGE
INTRAMUSCULAR | Status: DC | PRN
  Administered 2016-07-01: 100 mg via INTRAVENOUS

## 2016-07-01 MED ORDER — SODIUM CHLORIDE 0.9 % IJ SOLN
INTRAMUSCULAR | Status: AC
Start: 2016-07-01 — End: 2016-07-01
  Filled 2016-07-01: qty 50

## 2016-07-01 MED ORDER — LEVOFLOXACIN IN D5W 750 MG/150ML IV SOLN
750.0000 mg | INTRAVENOUS | Status: AC
  Administered 2016-07-01: 750 mg via INTRAVENOUS
  Filled 2016-07-01: qty 150

## 2016-07-01 MED ORDER — FENTANYL CITRATE (PF) 250 MCG/5ML IJ SOLN
INTRAMUSCULAR | Status: AC
  Filled 2016-07-01: qty 5

## 2016-07-01 MED ORDER — ROCURONIUM BROMIDE 50 MG/5ML IV SOSY
PREFILLED_SYRINGE | INTRAVENOUS | Status: DC | PRN
  Administered 2016-07-01: 60 mg via INTRAVENOUS
  Administered 2016-07-01: 20 mg via INTRAVENOUS
  Administered 2016-07-01 (×2): 10 mg via INTRAVENOUS

## 2016-07-01 MED ORDER — MORPHINE SULFATE (PF) 4 MG/ML IV SOLN
1.0000 mg | INTRAVENOUS | Status: DC | PRN
  Administered 2016-07-01 – 2016-07-02 (×4): 1 mg via INTRAVENOUS
  Administered 2016-07-02 (×3): 2 mg via INTRAVENOUS
  Administered 2016-07-02: 1 mg via INTRAVENOUS
  Administered 2016-07-02 (×2): 2 mg via INTRAVENOUS
  Administered 2016-07-03: 1 mg via INTRAVENOUS
  Administered 2016-07-03 (×2): 2 mg via INTRAVENOUS
  Administered 2016-07-04 (×3): 1 mg via INTRAVENOUS
  Filled 2016-07-01 (×16): qty 1

## 2016-07-01 MED ORDER — CELECOXIB 200 MG PO CAPS
400.0000 mg | ORAL_CAPSULE | ORAL | Status: AC
  Administered 2016-07-01: 400 mg via ORAL
  Filled 2016-07-01: qty 2

## 2016-07-01 MED ORDER — SODIUM CHLORIDE 0.9 % IJ SOLN
INTRAMUSCULAR | Status: DC | PRN
  Administered 2016-07-01: 20 mL

## 2016-07-01 MED ORDER — PANTOPRAZOLE SODIUM 40 MG IV SOLR
40.0000 mg | Freq: Every day | INTRAVENOUS | Status: DC
  Administered 2016-07-01 – 2016-07-03 (×3): 40 mg via INTRAVENOUS
  Filled 2016-07-01 (×3): qty 40

## 2016-07-01 MED ORDER — EPHEDRINE 5 MG/ML INJ
INTRAVENOUS | Status: AC
  Filled 2016-07-01: qty 10

## 2016-07-01 MED ORDER — SODIUM CHLORIDE 0.9 % IV SOLN
INTRAVENOUS | Status: DC | PRN
  Administered 2016-07-01: 50 ug/min via INTRAVENOUS

## 2016-07-01 MED ORDER — LIDOCAINE 2% (20 MG/ML) 5 ML SYRINGE
INTRAMUSCULAR | Status: AC
  Filled 2016-07-01: qty 5

## 2016-07-01 MED ORDER — KETAMINE HCL 10 MG/ML IJ SOLN
INTRAMUSCULAR | Status: AC
  Filled 2016-07-01: qty 1

## 2016-07-01 MED ORDER — MIDAZOLAM HCL 5 MG/5ML IJ SOLN
INTRAMUSCULAR | Status: DC | PRN
  Administered 2016-07-01: 2 mg via INTRAVENOUS

## 2016-07-01 MED ORDER — HEPARIN SODIUM (PORCINE) 5000 UNIT/ML IJ SOLN
5000.0000 [IU] | Freq: Three times a day (TID) | INTRAMUSCULAR | Status: DC
  Administered 2016-07-02 – 2016-07-04 (×7): 5000 [IU] via SUBCUTANEOUS
  Filled 2016-07-01 (×7): qty 1

## 2016-07-01 MED ORDER — HEPARIN SODIUM (PORCINE) 5000 UNIT/ML IJ SOLN
5000.0000 [IU] | INTRAMUSCULAR | Status: AC
  Administered 2016-07-01: 5000 [IU] via SUBCUTANEOUS
  Filled 2016-07-01: qty 1

## 2016-07-01 MED ORDER — PROPOFOL 10 MG/ML IV BOLUS
INTRAVENOUS | Status: DC | PRN
  Administered 2016-07-01: 400 mg via INTRAVENOUS

## 2016-07-01 MED ORDER — DEXAMETHASONE SODIUM PHOSPHATE 10 MG/ML IJ SOLN
INTRAMUSCULAR | Status: AC
  Filled 2016-07-01: qty 1

## 2016-07-01 MED ORDER — MIDAZOLAM HCL 2 MG/2ML IJ SOLN
INTRAMUSCULAR | Status: AC
  Filled 2016-07-01: qty 2

## 2016-07-01 MED ORDER — FENTANYL CITRATE (PF) 100 MCG/2ML IJ SOLN
INTRAMUSCULAR | Status: AC
  Filled 2016-07-01: qty 2

## 2016-07-01 MED ORDER — FENTANYL CITRATE (PF) 100 MCG/2ML IJ SOLN
25.0000 ug | INTRAMUSCULAR | Status: DC | PRN
  Administered 2016-07-01 (×3): 50 ug via INTRAVENOUS

## 2016-07-01 MED ORDER — PROPOFOL 10 MG/ML IV BOLUS
INTRAVENOUS | Status: AC
  Filled 2016-07-01: qty 40

## 2016-07-01 MED ORDER — LACTATED RINGERS IV SOLN
INTRAVENOUS | Status: DC
  Administered 2016-07-01 (×2): via INTRAVENOUS

## 2016-07-01 MED ORDER — KCL IN DEXTROSE-NACL 20-5-0.45 MEQ/L-%-% IV SOLN
INTRAVENOUS | Status: DC
Start: 2016-07-01 — End: 2016-07-04
  Administered 2016-07-01: 100 mL/h via INTRAVENOUS
  Administered 2016-07-02: 11:00:00 via INTRAVENOUS
  Administered 2016-07-02: 1000 mL via INTRAVENOUS
  Administered 2016-07-02 – 2016-07-03 (×2): via INTRAVENOUS
  Administered 2016-07-03: 1000 mL via INTRAVENOUS
  Filled 2016-07-01 (×9): qty 1000

## 2016-07-01 MED ORDER — ACETAMINOPHEN 160 MG/5ML PO SOLN
325.0000 mg | ORAL | Status: DC | PRN
  Administered 2016-07-03 (×2): 650 mg via ORAL
  Filled 2016-07-01 (×2): qty 20.3

## 2016-07-01 MED ORDER — PREMIER PROTEIN SHAKE
2.0000 [oz_av] | ORAL | Status: DC
  Administered 2016-07-02 – 2016-07-04 (×11): 2 [oz_av] via ORAL

## 2016-07-01 MED ORDER — ONDANSETRON HCL 4 MG/2ML IJ SOLN
INTRAMUSCULAR | Status: DC | PRN
  Administered 2016-07-01: 4 mg via INTRAVENOUS

## 2016-07-01 MED ORDER — FENTANYL CITRATE (PF) 100 MCG/2ML IJ SOLN
INTRAMUSCULAR | Status: DC | PRN
  Administered 2016-07-01: 50 ug via INTRAVENOUS
  Administered 2016-07-01: 100 ug via INTRAVENOUS
  Administered 2016-07-01 (×4): 25 ug via INTRAVENOUS

## 2016-07-01 SURGICAL SUPPLY — 73 items
ADH SKN CLS APL DERMABOND .7 (GAUZE/BANDAGES/DRESSINGS) ×1
APL SRG 32X5 SNPLK LF DISP (MISCELLANEOUS) ×1
APPLICATOR COTTON TIP 6IN STRL (MISCELLANEOUS) IMPLANT
APPLIER CLIP ROT 10 11.4 M/L (STAPLE)
APPLIER CLIP ROT 13.4 12 LRG (CLIP)
APR CLP LRG 13.4X12 ROT 20 MLT (CLIP)
APR CLP MED LRG 11.4X10 (STAPLE)
BAG SPEC RTRVL LRG 6X4 10 (ENDOMECHANICALS) ×1
BLADE HEX COATED 2.75 (ELECTRODE) ×3 IMPLANT
BLADE SURG 15 STRL LF DISP TIS (BLADE) ×1 IMPLANT
BLADE SURG 15 STRL SS (BLADE) ×3
CABLE HIGH FREQUENCY MONO STRZ (ELECTRODE) IMPLANT
CLIP APPLIE ROT 10 11.4 M/L (STAPLE) IMPLANT
CLIP APPLIE ROT 13.4 12 LRG (CLIP) IMPLANT
COVER SURGICAL LIGHT HANDLE (MISCELLANEOUS) ×3 IMPLANT
DERMABOND ADVANCED (GAUZE/BANDAGES/DRESSINGS) ×2
DERMABOND ADVANCED .7 DNX12 (GAUZE/BANDAGES/DRESSINGS) IMPLANT
DEVICE SUT QUICK LOAD TK 5 (STAPLE) IMPLANT
DEVICE SUT TI-KNOT TK 5X26 (MISCELLANEOUS) IMPLANT
DEVICE SUTURE ENDOST 10MM (ENDOMECHANICALS) IMPLANT
DEVICE TI KNOT TK5 (MISCELLANEOUS)
DEVICE TROCAR PUNCTURE CLOSURE (ENDOMECHANICALS) IMPLANT
ELECT PENCIL ROCKER SW 15FT (MISCELLANEOUS) ×3 IMPLANT
ELECT REM PT RETURN 9FT ADLT (ELECTROSURGICAL) ×3
ELECTRODE REM PT RTRN 9FT ADLT (ELECTROSURGICAL) ×1 IMPLANT
EVACUATOR SILICONE 100CC (DRAIN) IMPLANT
GAUZE SPONGE 4X4 12PLY STRL (GAUZE/BANDAGES/DRESSINGS) IMPLANT
GLOVE BIOGEL M 8.0 STRL (GLOVE) ×3 IMPLANT
GOWN STRL REUS W/TWL XL LVL3 (GOWN DISPOSABLE) ×12 IMPLANT
HANDLE STAPLE EGIA 4 XL (STAPLE) ×3 IMPLANT
HOVERMATT SINGLE USE (MISCELLANEOUS) ×3 IMPLANT
IRRIG SUCT STRYKERFLOW 2 WTIP (MISCELLANEOUS) ×3
IRRIGATION SUCT STRKRFLW 2 WTP (MISCELLANEOUS) ×1 IMPLANT
KIT BASIN OR (CUSTOM PROCEDURE TRAY) ×3 IMPLANT
NDL SPNL 22GX3.5 QUINCKE BK (NEEDLE) ×1 IMPLANT
NEEDLE SPNL 22GX3.5 QUINCKE BK (NEEDLE) ×3 IMPLANT
PACK UNIVERSAL I (CUSTOM PROCEDURE TRAY) ×3 IMPLANT
POUCH SPECIMEN RETRIEVAL 10MM (ENDOMECHANICALS) ×3 IMPLANT
QUICK LOAD TK 5 (STAPLE)
RELOAD ENDO STITCH (ENDOMECHANICALS) IMPLANT
RELOAD STAPLE 45 PURP MED/THCK (STAPLE) IMPLANT
RELOAD SUT TRIPLE-STITCH 2-0 (ENDOMECHANICALS) IMPLANT
RELOAD TRI 45 ART MED THCK BLK (STAPLE) ×3 IMPLANT
RELOAD TRI 45 ART MED THCK PUR (STAPLE) IMPLANT
RELOAD TRI 60 ART MED THCK BLK (STAPLE) ×9 IMPLANT
RELOAD TRI 60 ART MED THCK PUR (STAPLE) IMPLANT
SCISSORS LAP 5X45 EPIX DISP (ENDOMECHANICALS) IMPLANT
SCRUB TECHNI CARE 4 OZ NO DYE (MISCELLANEOUS) ×6 IMPLANT
SEALANT SURGICAL APPL DUAL CAN (MISCELLANEOUS) ×3 IMPLANT
SHEARS HARMONIC ACE PLUS 45CM (MISCELLANEOUS) ×3 IMPLANT
SLEEVE ADV FIXATION 12X100MM (TROCAR) IMPLANT
SLEEVE ADV FIXATION 5X100MM (TROCAR) ×6 IMPLANT
SLEEVE GASTRECTOMY 36FR VISIGI (MISCELLANEOUS) ×3 IMPLANT
SOLUTION ANTI FOG 6CC (MISCELLANEOUS) ×3 IMPLANT
SPONGE LAP 18X18 X RAY DECT (DISPOSABLE) ×3 IMPLANT
STAPLER VISISTAT 35W (STAPLE) ×3 IMPLANT
SUT ETHILON 2 0 PS N (SUTURE) IMPLANT
SUT VIC AB 4-0 SH 18 (SUTURE) ×3 IMPLANT
SUT VICRYL 0 TIES 12 18 (SUTURE) ×3 IMPLANT
SUT VICRYL 0 UR6 27IN ABS (SUTURE) ×2 IMPLANT
SYR 20CC LL (SYRINGE) ×3 IMPLANT
SYR 50ML LL SCALE MARK (SYRINGE) ×3 IMPLANT
TOWEL OR 17X26 10 PK STRL BLUE (TOWEL DISPOSABLE) ×6 IMPLANT
TOWEL OR NON WOVEN STRL DISP B (DISPOSABLE) ×3 IMPLANT
TRAY FOLEY W/METER SILVER 16FR (SET/KITS/TRAYS/PACK) ×3 IMPLANT
TROCAR ADV FIXATION 12X100MM (TROCAR) ×3 IMPLANT
TROCAR ADV FIXATION 5X100MM (TROCAR) ×3 IMPLANT
TROCAR BLADELESS 15MM (ENDOMECHANICALS) ×3 IMPLANT
TROCAR BLADELESS OPT 5 100 (ENDOMECHANICALS) ×3 IMPLANT
TUBING CONNECTING 10 (TUBING) ×2 IMPLANT
TUBING CONNECTING 10' (TUBING) ×1
TUBING ENDO SMARTCAP (MISCELLANEOUS) ×3 IMPLANT
TUBING INSUF HEATED (TUBING) ×3 IMPLANT

## 2016-07-01 NOTE — H&P (View-Only) (Signed)
Chief Complaint:  Failure to lose adequate weight  History of Present Illness:  Nathaniel Chambers is an 51 y.o. male who had a lapband placed by me in 2008.  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  His weight has become an issue with the management of his painful left leg and he cannot exercise effectively.  We have been unable to adjust the band to provide him with adequate weight loss.  He presents today for removal of the lapband and conversion to a sleeve gastrectomy.    Past Medical History:  Diagnosis Date  . Asthma   . Cancer [facility])    Desmoid fibromatosis  . 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 Leonides Schanz  . Diverticulitis   . Hypertension   . Meralgia paraesthetica March 2015   Bilaterally  . Pneumonia    history of  . S/P radiation therapy 01/14/2015 through 02/20/2015    Left posterior thighs/inferior pelvis 5600 cGy in 28 sessions   . Sleep apnea   . Torn meniscus 2018    Past Surgical History:  Procedure Laterality Date  . HERNIA REPAIR  08/2008   rt inguinal  . LAPAROSCOPIC GASTRIC BANDING  04-26-2007  . LAPAROSCOPIC SIGMOID COLECTOMY N/A 08/03/2014   Procedure: LAPAROSCOPIC TAKEDOWN INTRA-ABDOMINAL ABSCESS, SIGMOID COLECTOMY WITH PRIMARY ANASTAMOSIS, APPENDECTOMY, MOBILIZATION OF SPLENIC FLEXURE;  Surgeon: Johnathan Hausen, MD;  Location: [facility];  Service: General;  Laterality: N/A;  . LEFT SHOULDER SURG  2000   rotator cuff  . Left Thigh Fibromatosis Resection  06/30/13  . LEG SURGERY     cancer  . REPAIR RECURRENT RIGHT INGUINAL HERNIA  10-22-2008  . SHOULDER ARTHROSCOPY  01/2009   rt   . SHOULDER ARTHROSCOPY  07/01/2011   Procedure: ARTHROSCOPY SHOULDER;  Surgeon: Magnus Sinning, MD;  Location: [facility];  Service: Orthopedics;  Laterality:  Left;  WITH LABRAL DEBRIDEMENT  . SHOULDER OPEN ROTATOR CUFF REPAIR  07/01/2011   Procedure: ROTATOR CUFF REPAIR SHOULDER OPEN;  Surgeon: Magnus Sinning, MD;  Location: [place];  Service: Orthopedics;  Laterality: Left;  OPEN DISTAL CLAVICLE REPAIR  . SURG. FOR UNDESCENDED TESTICLES/ BILATERAL INGUINAL HERNIA REPAIR  AGE 73 MON OLD  . vasectomy      Current Outpatient Prescriptions  Medication Sig Dispense Refill  . albuterol (PROVENTIL HFA;VENTOLIN HFA) 108 (90 BASE) MCG/ACT inhaler Inhale 2 puffs into the lungs every 6 (six) hours as needed for wheezing or shortness of breath (wheezing).     . celecoxib (CELEBREX) 100 MG capsule Take 1 capsule by mouth 2 (two) times daily.     . Fluticasone-Salmeterol (ADVAIR) 250-50 MCG/DOSE AEPB Inhale 1 puff into the lungs 2 (two) times daily.     Marland Kitchen gabapentin (NEURONTIN) 300 MG capsule Take 3 capsules (900 mg total) by mouth at bedtime. (Patient taking differently: Take 900 mg by mouth every evening. ) 270 capsule 5  . gabapentin (NEURONTIN) 600 MG tablet Take 2 tablets (1,200 mg total) by mouth at bedtime. 180 tablet 3  . hydrochlorothiazide (MICROZIDE) 12.5 MG capsule Take 12.5 mg by mouth every morning.  1  . ibuprofen (ADVIL,MOTRIN) 800 MG tablet Take 1 tablet (800 mg total) by mouth daily as needed. (Patient not taking: Reported on 06/22/2016) 90 tablet 0  . nortriptyline (PAMELOR) 10 MG capsule Start nortriptyline 10mg  at bedtime for 2 week, then  increase to 2 tablet at bedtime (Patient taking differently: Take 10 mg by mouth at bedtime. ) 60 capsule 5  . ondansetron (ZOFRAN) 4 MG tablet Take 1 tablet (4 mg total) by mouth every 8 (eight) hours as needed for nausea or vomiting. 30 tablet 2  . polyethylene glycol (MIRALAX / GLYCOLAX) packet Take 17 g by mouth daily. (Patient taking differently: Take 17 g by mouth daily as needed for mild constipation. ) 14 each 0  . tamoxifen (NOLVADEX) 20 MG tablet TAKE 1 TABLET (20 MG TOTAL) BY MOUTH  DAILY. (Patient taking differently: Take 20 mg by mouth daily. 1800) 60 tablet 0  . Vitamin D, Ergocalciferol, (DRISDOL) 50000 UNITS CAPS capsule Take 50,000 Units by mouth every 7 (seven) days. Days of week vary.     No current facility-administered medications for this visit.    Penicillins; Solu-medrol [methylprednisolone acetate]; Dilaudid [hydromorphone hcl]; Oxycodone; and Percocet [oxycodone-acetaminophen] Family History  Problem Relation Age of Onset  . Heart disease Father   . Cancer Mother     kidney, spleen   Social History:   reports that he quit smoking about 28 years ago. He has a 0.25 pack-year smoking history. He has never used smokeless tobacco. He reports that he does not drink alcohol or use drugs.   REVIEW OF SYSTEMS : Negative except for see problem list  Physical Exam:   There were no vitals taken for this visit. There is no height or weight on file to calculate BMI.  Gen:  WDWN AAM NAD  Neurological: Alert and oriented to person, place, and time. Motor and sensory function is grossly intact  Head: Normocephalic and atraumatic.  Eyes: Conjunctivae are normal. Pupils are equal, round, and reactive to light. No scleral icterus.  Neck: Normal range of motion. Neck supple. No tracheal deviation or thyromegaly present.  Cardiovascular:  SR without murmurs or gallops.  No carotid bruits Breast:  Not examined Respiratory: Effort normal.  No respiratory distress. No chest wall tenderness. Breath sounds normal.  No wheezes, rales or rhonchi.  Abdomen:  Palpable lapband port;  Midline incision from colectomy GU:  Prior hernia repair Musculoskeletal: Normal range of motion. Extremities are nontender. No cyanosis, edema or clubbing noted Lymphadenopathy: No cervical, preauricular, postauricular or axillary adenopathy is present Skin: Skin is warm and dry. No rash noted. No diaphoresis. No erythema. No pallor. Pscyh: Normal mood and affect. Behavior is normal. Judgment and  thought content normal.   LABORATORY RESULTS: No results found for this or any previous visit (from the past 48 hour(s)).   RADIOLOGY RESULTS: No results found.  Problem List: Patient Active Problem List   Diagnosis Date Noted  . Desmoid fibromatosis 12/13/2014  . S/P partial colectomy 08/03/2014  . Meralgia paraesthetica 07/12/2014  . Diverticulitis of large intestine without perforation or abscess without bleeding   . Enteritis due to Clostridium difficile   . Obesity 04/10/2014  . Colonic diverticular abscess 04/04/2014  . Sigmoid diverticulitis 04/04/2014  . Elevated lipase 12/19/2013  . Diverticulitis 12/15/2013  . Asthma 12/15/2013  . Fatty liver 12/15/2013    Assessment & Plan: Failure of lapband to achieve adequate weight loss;  Removal of lapband and conversion to sleeve gastrectomy.      Matt B. Hassell Done, MD, [facility] [phone] beeper [phone]  07/01/2016 7:23 AM

## 2016-07-01 NOTE — Anesthesia Preprocedure Evaluation (Addendum)
Anesthesia Evaluation  Patient identified by MRN, date of birth, ID band Patient awake    Reviewed: Allergy & Precautions, NPO status , Patient's Chart, lab work & pertinent test results  Airway Mallampati: II  TM Distance: >3 FB Neck ROM: Full    Dental  (+) Teeth Intact, Dental Advisory Given, Caps,    Pulmonary asthma , sleep apnea , former smoker,    Pulmonary exam normal breath sounds clear to auscultation       Cardiovascular hypertension, Pt. on medications Normal cardiovascular exam Rhythm:Regular Rate:Normal     Neuro/Psych Meralgia paraesthetica negative psych ROS   GI/Hepatic negative GI ROS, Neg liver ROS,   Endo/Other  Morbid obesity  Renal/GU negative Renal ROS     Musculoskeletal negative musculoskeletal ROS (+)   Abdominal   Peds  Hematology negative hematology ROS (+)   Anesthesia Other Findings Day of surgery medications reviewed with the patient.  Reproductive/Obstetrics                            Anesthesia Physical Anesthesia Plan  ASA: III  Anesthesia Plan: General   Post-op Pain Management:    Induction: Intravenous  Airway Management Planned: Oral ETT  Additional Equipment:   Intra-op Plan:   Post-operative Plan: Extubation in OR  Informed Consent: I have reviewed the patients History and Physical, chart, labs and discussed the procedure including the risks, benefits and alternatives for the proposed anesthesia with the patient or authorized representative who has indicated his/her understanding and acceptance.   Dental advisory given  Plan Discussed with: CRNA  Anesthesia Plan Comments: (Risks/benefits of general anesthesia discussed with patient including risk of damage to teeth, lips, gum, and tongue, nausea/vomiting, allergic reactions to medications, and the possibility of heart attack, stroke and death.  All patient questions answered.  Patient  wishes to proceed.)        Anesthesia Quick Evaluation

## 2016-07-01 NOTE — Interval H&P Note (Signed)
History and Physical Interval Note:  07/01/2016 10:07 AM  Nathaniel Chambers  has presented today for surgery, with the diagnosis of morbid obesity, Osgoods-Schlatters Disease, Gastric Band Malfunction  The various methods of treatment have been discussed with the patient and family. After consideration of risks, benefits and other options for treatment, the patient has consented to  Procedure(s): LAPAROSCOPIC GASTRIC BAND REMOVAL WITH LAPAROSCOPIC GASTRIC SLEEVE RESECTION (N/A) as a surgical intervention .  The patient's history has been reviewed, patient examined, no change in status, stable for surgery.  I have reviewed the patient's chart and labs.  Questions were answered to the patient's satisfaction.     Charlann Wayne B

## 2016-07-01 NOTE — Transfer of Care (Signed)
Immediate Anesthesia Transfer of Care Note  Patient: Nathaniel Chambers  Procedure(s) Performed: Procedure(s): LAPAROSCOPIC GASTRIC BAND REMOVAL WITH LAPAROSCOPIC GASTRIC SLEEVE RESECTION (N/A)  Patient Location: PACU  Anesthesia Type:General  Level of Consciousness:  sedated, patient cooperative and responds to stimulation  Airway & Oxygen Therapy:Patient Spontanous Breathing and Patient connected to face mask oxgen  Post-op Assessment:  Report given to PACU RN and Post -op Vital signs reviewed and stable  Post vital signs:  Reviewed and stable  Last Vitals:  Vitals:   07/01/16 0810  BP: (!) 143/87  Pulse: (!) 105  Resp: 20  Temp: 46.2 C    Complications: No apparent anesthesia complications

## 2016-07-01 NOTE — Anesthesia Postprocedure Evaluation (Signed)
Anesthesia Post Note  Patient: Nathaniel Chambers  Procedure(s) Performed: Procedure(s) (LRB): LAPAROSCOPIC GASTRIC BAND REMOVAL WITH LAPAROSCOPIC GASTRIC SLEEVE RESECTION (N/A)  Patient location during evaluation: PACU Anesthesia Type: General Level of consciousness: awake and alert Pain management: pain level controlled Vital Signs Assessment: post-procedure vital signs reviewed and stable Respiratory status: spontaneous breathing, nonlabored ventilation, respiratory function stable and patient connected to nasal cannula oxygen Cardiovascular status: blood pressure returned to baseline and stable Postop Assessment: no signs of nausea or vomiting Anesthetic complications: no       Last Vitals:  Vitals:   07/01/16 1554 07/01/16 1640  BP: (!) 146/68 (!) 149/71  Pulse: 75 79  Resp: 14 15  Temp:  36.4 C    Last Pain:  Vitals:   07/01/16 1712  TempSrc:   PainSc: [place]

## 2016-07-01 NOTE — Progress Notes (Signed)
Dr Hassell Done called at patient's request for pain medication change. Patient states fentanyl is causing nausea. Order noted

## 2016-07-01 NOTE — H&P (Signed)
Chief Complaint:  Failure to lose adequate weight  History of Present Illness:  Nathaniel Chambers is an 51 y.o. male who had a lapband placed by me in 2008.  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  His weight has become an issue with the management of his painful left leg and he cannot exercise effectively.  We have been unable to adjust the band to provide him with adequate weight loss.  He presents today for removal of the lapband and conversion to a sleeve gastrectomy.    Past Medical History:  Diagnosis Date  . Asthma   . Cancer [facility])    Desmoid fibromatosis  . 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 Leonides Schanz  . Diverticulitis   . Hypertension   . Meralgia paraesthetica March 2015   Bilaterally  . Pneumonia    history of  . S/P radiation therapy 01/14/2015 through 02/20/2015    Left posterior thighs/inferior pelvis 5600 cGy in 28 sessions   . Sleep apnea   . Torn meniscus 2018    Past Surgical History:  Procedure Laterality Date  . HERNIA REPAIR  08/2008   rt inguinal  . LAPAROSCOPIC GASTRIC BANDING  04-26-2007  . LAPAROSCOPIC SIGMOID COLECTOMY N/A 08/03/2014   Procedure: LAPAROSCOPIC TAKEDOWN INTRA-ABDOMINAL ABSCESS, SIGMOID COLECTOMY WITH PRIMARY ANASTAMOSIS, APPENDECTOMY, MOBILIZATION OF SPLENIC FLEXURE;  Surgeon: Johnathan Hausen, MD;  Location: [facility];  Service: General;  Laterality: N/A;  . LEFT SHOULDER SURG  2000   rotator cuff  . Left Thigh Fibromatosis Resection  06/30/13  . LEG SURGERY     cancer  . REPAIR RECURRENT RIGHT INGUINAL HERNIA  10-22-2008  . SHOULDER ARTHROSCOPY  01/2009   rt   . SHOULDER ARTHROSCOPY  07/01/2011   Procedure: ARTHROSCOPY SHOULDER;  Surgeon: Magnus Sinning, MD;  Location: [facility];  Service: Orthopedics;  Laterality:  Left;  WITH LABRAL DEBRIDEMENT  . SHOULDER OPEN ROTATOR CUFF REPAIR  07/01/2011   Procedure: ROTATOR CUFF REPAIR SHOULDER OPEN;  Surgeon: Magnus Sinning, MD;  Location: [place];  Service: Orthopedics;  Laterality: Left;  OPEN DISTAL CLAVICLE REPAIR  . SURG. FOR UNDESCENDED TESTICLES/ BILATERAL INGUINAL HERNIA REPAIR  AGE 75 MON OLD  . vasectomy      Current Outpatient Prescriptions  Medication Sig Dispense Refill  . albuterol (PROVENTIL HFA;VENTOLIN HFA) 108 (90 BASE) MCG/ACT inhaler Inhale 2 puffs into the lungs every 6 (six) hours as needed for wheezing or shortness of breath (wheezing).     . celecoxib (CELEBREX) 100 MG capsule Take 1 capsule by mouth 2 (two) times daily.     . Fluticasone-Salmeterol (ADVAIR) 250-50 MCG/DOSE AEPB Inhale 1 puff into the lungs 2 (two) times daily.     Marland Kitchen gabapentin (NEURONTIN) 300 MG capsule Take 3 capsules (900 mg total) by mouth at bedtime. (Patient taking differently: Take 900 mg by mouth every evening. ) 270 capsule 5  . gabapentin (NEURONTIN) 600 MG tablet Take 2 tablets (1,200 mg total) by mouth at bedtime. 180 tablet 3  . hydrochlorothiazide (MICROZIDE) 12.5 MG capsule Take 12.5 mg by mouth every morning.  1  . ibuprofen (ADVIL,MOTRIN) 800 MG tablet Take 1 tablet (800 mg total) by mouth daily as needed. (Patient not taking: Reported on 06/22/2016) 90 tablet 0  . nortriptyline (PAMELOR) 10 MG capsule Start nortriptyline 10mg  at bedtime for 2 week, then  increase to 2 tablet at bedtime (Patient taking differently: Take 10 mg by mouth at bedtime. ) 60 capsule 5  . ondansetron (ZOFRAN) 4 MG tablet Take 1 tablet (4 mg total) by mouth every 8 (eight) hours as needed for nausea or vomiting. 30 tablet 2  . polyethylene glycol (MIRALAX / GLYCOLAX) packet Take 17 g by mouth daily. (Patient taking differently: Take 17 g by mouth daily as needed for mild constipation. ) 14 each 0  . tamoxifen (NOLVADEX) 20 MG tablet TAKE 1 TABLET (20 MG TOTAL) BY MOUTH  DAILY. (Patient taking differently: Take 20 mg by mouth daily. 1800) 60 tablet 0  . Vitamin D, Ergocalciferol, (DRISDOL) 50000 UNITS CAPS capsule Take 50,000 Units by mouth every 7 (seven) days. Days of week vary.     No current facility-administered medications for this visit.    Penicillins; Solu-medrol [methylprednisolone acetate]; Dilaudid [hydromorphone hcl]; Oxycodone; and Percocet [oxycodone-acetaminophen] Family History  Problem Relation Age of Onset  . Heart disease Father   . Cancer Mother     kidney, spleen   Social History:   reports that he quit smoking about 28 years ago. He has a 0.25 pack-year smoking history. He has never used smokeless tobacco. He reports that he does not drink alcohol or use drugs.   REVIEW OF SYSTEMS : Negative except for see problem list  Physical Exam:   There were no vitals taken for this visit. There is no height or weight on file to calculate BMI.  Gen:  WDWN AAM NAD  Neurological: Alert and oriented to person, place, and time. Motor and sensory function is grossly intact  Head: Normocephalic and atraumatic.  Eyes: Conjunctivae are normal. Pupils are equal, round, and reactive to light. No scleral icterus.  Neck: Normal range of motion. Neck supple. No tracheal deviation or thyromegaly present.  Cardiovascular:  SR without murmurs or gallops.  No carotid bruits Breast:  Not examined Respiratory: Effort normal.  No respiratory distress. No chest wall tenderness. Breath sounds normal.  No wheezes, rales or rhonchi.  Abdomen:  Palpable lapband port;  Midline incision from colectomy GU:  Prior hernia repair Musculoskeletal: Normal range of motion. Extremities are nontender. No cyanosis, edema or clubbing noted Lymphadenopathy: No cervical, preauricular, postauricular or axillary adenopathy is present Skin: Skin is warm and dry. No rash noted. No diaphoresis. No erythema. No pallor. Pscyh: Normal mood and affect. Behavior is normal. Judgment and  thought content normal.   LABORATORY RESULTS: No results found for this or any previous visit (from the past 48 hour(s)).   RADIOLOGY RESULTS: No results found.  Problem List: Patient Active Problem List   Diagnosis Date Noted  . Desmoid fibromatosis 12/13/2014  . S/P partial colectomy 08/03/2014  . Meralgia paraesthetica 07/12/2014  . Diverticulitis of large intestine without perforation or abscess without bleeding   . Enteritis due to Clostridium difficile   . Obesity 04/10/2014  . Colonic diverticular abscess 04/04/2014  . Sigmoid diverticulitis 04/04/2014  . Elevated lipase 12/19/2013  . Diverticulitis 12/15/2013  . Asthma 12/15/2013  . Fatty liver 12/15/2013    Assessment & Plan: Failure of lapband to achieve adequate weight loss;  Removal of lapband and conversion to sleeve gastrectomy.      Matt B. Hassell Done, MD, [facility] [phone] beeper [phone]  07/01/2016 7:23 AM

## 2016-07-01 NOTE — Op Note (Addendum)
Surgeon: Kaylyn Lim, MD, FACS  Asst:  Gurney Maxin, MD  Anes:  general  Preop Dx: Failure to lose sufficient weight with lapband dysfunction and need to lose because of severe arthritis of the lower extremity (desmoid tumor); Osgood Schlater's disease, Osteochondroma of the left tibia, fibromatosis.  Preop weight is 347.6 with BMI 44.6 Postop Dx: same  Procedure: Laparoscopic removal of lapband and conversion to sleeve gastrectomy with upper endoscopy Location Surgery: [facility] OR 4 Complications: none  EBL:   25 cc  Drains: none  Description of Procedure:  The patient was taken to OR 4 .  After anesthesia was administered and the patient was prepped a timeout was performed.  Access to the abdomen was achieved with a 5 mm Optiview through the left upper quadrant.  Sherrie Sport was placed to retract the left lateral segment.  A 15 mm trocar was placed through the port site.  Sharp dissection to free the band, uncouple it, cut it and remove it.  The plication was completely taken down and the cicatrix divided.  No enterotomy was noted and we felt that we could proceed with sleeve gastrectomy.    The VisiGi 36 was inserted and the stomach decompressed. The short gastric vessels were taken down to within 5 cm of the antrum and up to the left crus.  The sleeve was created with multiple firings of the Covidien black load with TRS.  The resected stomach was removed through the 15 mm port.  Endoscopy performed by Kinsinger showed no leaks or bleeding.  The port and remaining tubing were removed.  The port sites were injected with Exparel.  Closure with 4-0 monocryl and Dermabond.  Abdomen survey  The patient tolerated the procedure well and was taken to the PACU in stable condition.     Matt B. Hassell Done, [place], [facility], [place]

## 2016-07-01 NOTE — Anesthesia Procedure Notes (Signed)
Procedure Name: Intubation Date/Time: 07/01/2016 10:39 AM Performed by: Maxwell Caul Pre-anesthesia Checklist: Patient identified, Emergency Drugs available, Suction available and Patient being monitored Patient Re-evaluated:Patient Re-evaluated prior to inductionOxygen Delivery Method: Circle system utilized Preoxygenation: Pre-oxygenation with 100% oxygen Intubation Type: IV induction Ventilation: Mask ventilation without difficulty Laryngoscope Size: Mac and 4 Grade View: Grade I Tube type: Oral Tube size: 8.0 mm Number of attempts: 1 Airway Equipment and Method: Stylet Placement Confirmation: ETT inserted through vocal cords under direct vision,  positive ETCO2 and breath sounds checked- equal and bilateral Secured at: 22 cm Tube secured with: Tape Dental Injury: Teeth and Oropharynx as per pre-operative assessment

## 2016-07-02 LAB — CBC WITH DIFFERENTIAL/PLATELET
BASOS PCT: 0 %
Basophils Absolute: 0 10*3/uL (ref 0.0–0.1)
Eosinophils Absolute: 0 10*3/uL (ref 0.0–0.7)
Eosinophils Relative: 0 %
HEMATOCRIT: 40.9 % (ref 39.0–52.0)
HEMOGLOBIN: 13.7 g/dL (ref 13.0–17.0)
LYMPHS ABS: 2.3 10*3/uL (ref 0.7–4.0)
Lymphocytes Relative: 16 %
MCH: 28.4 pg (ref 26.0–34.0)
MCHC: 33.5 g/dL (ref 30.0–36.0)
MCV: 84.9 fL (ref 78.0–100.0)
MONOS PCT: 7 %
Monocytes Absolute: 1 10*3/uL (ref 0.1–1.0)
NEUTROS ABS: 11.2 10*3/uL — AB (ref 1.7–7.7)
NEUTROS PCT: 77 %
Platelets: 302 10*3/uL (ref 150–400)
RBC: 4.82 MIL/uL (ref 4.22–5.81)
RDW: 13.6 % (ref 11.5–15.5)
WBC: 14.5 10*3/uL — AB (ref 4.0–10.5)

## 2016-07-02 MED ORDER — PROMETHAZINE HCL 25 MG PO TABS
12.5000 mg | ORAL_TABLET | Freq: Four times a day (QID) | ORAL | Status: DC | PRN
  Administered 2016-07-03 – 2016-07-04 (×2): 12.5 mg via ORAL
  Filled 2016-07-02 (×2): qty 1

## 2016-07-02 MED ORDER — PROMETHAZINE HCL 25 MG RE SUPP: 25.0000 mg | Freq: Four times a day (QID) | RECTAL | Status: DC | PRN

## 2016-07-02 MED ORDER — PROMETHAZINE HCL 25 MG/ML IJ SOLN
12.5000 mg | Freq: Four times a day (QID) | INTRAMUSCULAR | Status: DC | PRN
Start: 2016-07-02 — End: 2016-07-04
  Administered 2016-07-02 (×2): 12.5 mg via INTRAVENOUS
  Filled 2016-07-02 (×2): qty 1

## 2016-07-02 NOTE — Progress Notes (Signed)
Patient alert and oriented, Post op day 1.  Provided support and encouragement.  Encouraged pulmonary toilet, ambulation and small sips of liquids.  All questions answered.  Will continue to monitor. 

## 2016-07-02 NOTE — Plan of Care (Signed)
Problem: Food- and Nutrition-Related Knowledge Deficit (NB-1.1) Goal: Nutrition education Formal process to instruct or train a patient/client in a skill or to impart knowledge to help patients/clients voluntarily manage or modify food choices and eating behavior to maintain or improve health. Outcome: Completed/Met Date Met: 07/02/16 Nutrition Education Note  Received consult for diet education per DROP protocol.   Discussed 2 week post op diet with pt. Emphasized that liquids must be non carbonated, non caffeinated, and sugar free. Fluid goals discussed. Pt to follow up with outpatient bariatric RD for further diet progression after 2 weeks. Multivitamins and minerals also reviewed. Patient still needs to pick these up. Teach back method used, pt expressed understanding, expect good compliance.   Diet: First 2 Weeks  You will see the nutritionist about two (2) weeks after your surgery. The nutritionist will increase the types of foods you can eat if you are handling liquids well:  If you have severe vomiting or nausea and cannot handle clear liquids lasting longer than 1 day, call your surgeon  Protein Shake  Drink at least 2 ounces of shake 5-6 times per day  Each serving of protein shakes (usually 8 - 12 ounces) should have a minimum of:  15 grams of protein  And no more than 5 grams of carbohydrate  Goal for protein each day:  Men = 80 grams per day  Women = 60 grams per day  Protein powder may be added to fluids such as non-fat milk or Lactaid milk or Soy milk (limit to 35 grams added protein powder per serving)   Hydration  Slowly increase the amount of water and other clear liquids as tolerated (See Acceptable Fluids)  Slowly increase the amount of protein shake as tolerated  Sip fluids slowly and throughout the day  May use sugar substitutes in small amounts (no more than 6 - 8 packets per day; i.e. Splenda)   Fluid Goal  The first goal is to drink at least 8 ounces of protein  shake/drink per day (or as directed by the nutritionist); some examples of protein shakes are Premier Protein, [employer] Johnson, [employer]R [employer], EAS Edge HP, and Unjury. See handout from pre-op Bariatric Education Class:  Slowly increase the amount of protein shake you drink as tolerated  You may find it easier to slowly sip shakes throughout the day  It is important to get your proteins in first  Your fluid goal is to drink 64 - 100 ounces of fluid daily  It may take a few weeks to build up to this  32 oz (or more) should be clear liquids  And  32 oz (or more) should be full liquids (see below for examples)  Liquids should not contain sugar, caffeine, or carbonation   Clear Liquids:  Water or Sugar-free flavored water (i.e. Fruit H2O, Propel)  Decaffeinated coffee or tea (sugar-free)  Crystal Lite, Wyler's Lite, Minute Maid Lite  Sugar-free Jell-O  Bouillon or broth  Sugar-free Popsicle: *Less than 20 calories each; Limit 1 per day   Full Liquids:  Protein Shakes/Drinks + 2 choices per day of other full liquids  Full liquids must be:  No More Than 12 grams of Carbs per serving  No More Than 3 grams of Fat per serving  Strained low-fat cream soup  Non-Fat milk  Fat-free Lactaid Milk  Sugar-free yogurt (Dannon Lite & Fit, [place] yogurt, Oikos Zero)   Clayton Bibles, MS, RD, LDN Pager: [phone] After Hours Pager: [phone]

## 2016-07-02 NOTE — Progress Notes (Signed)
Gave Phenergan and Morphine at 1015 and patient slept for several hours.  So far today, pt has only ambulated 40 feet and sat in chair briefly before getting back in bed.  Says he gets dizzy when walking. Tried to encourage him to ambulate more to help with the discomfort.  Sipping on a cup of water since this morning, not yet drank half of it.  Will continue to monitor.

## 2016-07-02 NOTE — Progress Notes (Signed)
Patient nauseated IV Zofran does not seem to be helping.  Dr Hassell Done paged.  Orders received.

## 2016-07-03 LAB — CBC WITH DIFFERENTIAL/PLATELET
BASOS PCT: 0 %
Basophils Absolute: 0 10*3/uL (ref 0.0–0.1)
EOS ABS: 0 10*3/uL (ref 0.0–0.7)
Eosinophils Relative: 0 %
HEMATOCRIT: 41.6 % (ref 39.0–52.0)
HEMOGLOBIN: 13.7 g/dL (ref 13.0–17.0)
LYMPHS ABS: 2.6 10*3/uL (ref 0.7–4.0)
Lymphocytes Relative: 20 %
MCH: 28.7 pg (ref 26.0–34.0)
MCHC: 32.9 g/dL (ref 30.0–36.0)
MCV: 87 fL (ref 78.0–100.0)
MONO ABS: 0.6 10*3/uL (ref 0.1–1.0)
MONOS PCT: 5 %
NEUTROS PCT: 75 %
Neutro Abs: 9.4 10*3/uL — ABNORMAL HIGH (ref 1.7–7.7)
Platelets: 295 10*3/uL (ref 150–400)
RBC: 4.78 MIL/uL (ref 4.22–5.81)
RDW: 13.9 % (ref 11.5–15.5)
WBC: 12.6 10*3/uL — ABNORMAL HIGH (ref 4.0–10.5)

## 2016-07-03 NOTE — Progress Notes (Signed)
Patient ID: Nathaniel Chambers, male   DOB: [birth year], 51 y.o.   MRN: [id number] [facility] Progress Note:   2 Days Post-Op  Subjective: Mental status is clear.  Mobility is a challenge Objective: Vital signs in last 24 hours: Temp:  [98.3 F (36.8 C)-99.6 F (37.6 C)] 99.4 F (37.4 C) (03/09 2200) Pulse Rate:  [63-93] 85 (03/09 2200) Resp:  [16-18] 18 (03/09 2200) BP: (138-179)/(84-100) 160/95 (03/09 2200) SpO2:  [94 %-97 %] 97 % (03/09 2200)  Intake/Output from previous day: 03/08 0701 - 03/09 0700 In: 2600 [P.O.:300; I.V.:2300] Out: 2350 [Urine:2350] Intake/Output this shift: No intake/output data recorded.  Physical Exam: Work of breathing is not labored.  Incisions OK.  Hasn't tolerated po pain meds yet  Lab Results:  Results for orders placed or performed during the hospital encounter of 07/01/16 (from the past 48 hour(s))  CBC WITH DIFFERENTIAL     Status: Abnormal   Collection Time: 07/02/16  5:19 AM  Result Value Ref Range   WBC 14.5 (H) 4.0 - 10.5 K/uL   RBC 4.82 4.22 - 5.81 MIL/uL   Hemoglobin 13.7 13.0 - 17.0 g/dL   HCT 40.9 39.0 - 52.0 %   MCV 84.9 78.0 - 100.0 fL   MCH 28.4 26.0 - 34.0 pg   MCHC 33.5 30.0 - 36.0 g/dL   RDW 13.6 11.5 - 15.5 %   Platelets 302 150 - 400 K/uL   Neutrophils Relative % 77 %   Neutro Abs 11.2 (H) 1.7 - 7.7 K/uL   Lymphocytes Relative 16 %   Lymphs Abs 2.3 0.7 - 4.0 K/uL   Monocytes Relative 7 %   Monocytes Absolute 1.0 0.1 - 1.0 K/uL   Eosinophils Relative 0 %   Eosinophils Absolute 0.0 0.0 - 0.7 K/uL   Basophils Relative 0 %   Basophils Absolute 0.0 0.0 - 0.1 K/uL  CBC with Differential     Status: Abnormal   Collection Time: 07/03/16  5:39 AM  Result Value Ref Range   WBC 12.6 (H) 4.0 - 10.5 K/uL   RBC 4.78 4.22 - 5.81 MIL/uL   Hemoglobin 13.7 13.0 - 17.0 g/dL   HCT 41.6 39.0 - 52.0 %   MCV 87.0 78.0 - 100.0 fL   MCH 28.7 26.0 - 34.0 pg   MCHC 32.9 30.0 - 36.0 g/dL   RDW 13.9 11.5 - 15.5 %   Platelets 295  150 - 400 K/uL   Neutrophils Relative % 75 %   Neutro Abs 9.4 (H) 1.7 - 7.7 K/uL   Lymphocytes Relative 20 %   Lymphs Abs 2.6 0.7 - 4.0 K/uL   Monocytes Relative 5 %   Monocytes Absolute 0.6 0.1 - 1.0 K/uL   Eosinophils Relative 0 %   Eosinophils Absolute 0.0 0.0 - 0.7 K/uL   Basophils Relative 0 %   Basophils Absolute 0.0 0.0 - 0.1 K/uL    Radiology/Results: No results found.  Anti-infectives: Anti-infectives    Start     Dose/Rate Route Frequency Ordered Stop   07/01/16 0815  levofloxacin (LEVAQUIN) IVPB 750 mg     750 mg 100 mL/hr over 90 Minutes Intravenous On call to O.R. 07/01/16 0814 07/01/16 1044      Assessment/Plan: Problem List: Patient Active Problem List   Diagnosis Date Noted  . 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 Desmoid fibromatosis 12/13/2014  . S/P partial colectomy 08/03/2014  . Meralgia paraesthetica 07/12/2014  . Diverticulitis of large intestine without perforation or abscess  without bleeding   . Enteritis due to Clostridium difficile   . Obesity 04/10/2014  . Colonic diverticular abscess 04/04/2014  . Sigmoid diverticulitis 04/04/2014  . Elevated lipase 12/19/2013  . Diverticulitis 12/15/2013  . Asthma 12/15/2013  . Fatty liver 12/15/2013    Stable but not ready for discharge 2 Days Post-Op    LOS: 2 days   Matt B. Hassell Done, MD, [facility] [phone] beeper [phone]  07/03/2016 10:38 PM

## 2016-07-03 NOTE — Discharge Instructions (Signed)

## 2016-07-03 NOTE — Progress Notes (Signed)
Patient alert and oriented, Post op day 2.  Provided support and encouragement.  Encouraged pulmonary toilet, ambulation and small sips of protein chicken soup unjury.  All questions answered.  Will continue to monitor.

## 2016-07-03 NOTE — Progress Notes (Signed)
Patient alert and oriented, pain is controlled. Patient is tolerating fluids, advanced to protein shake today, patient is tolerating well.  Reviewed Gastric sleeve discharge instructions with patient and patient is able to articulate understanding.  Provided information on BELT program, Support Group and [facility] outpatient pharmacy. All questions answered, will continue to monitor.  

## 2016-07-04 DIAGNOSIS — T85518A Breakdown (mechanical) of other gastrointestinal prosthetic devices, implants and grafts, initial encounter: Secondary | ICD-10-CM | POA: Diagnosis not present

## 2016-07-04 MED ORDER — PROMETHAZINE HCL 12.5 MG PO TABS: 12.5000 mg | ORAL_TABLET | Freq: Four times a day (QID) | ORAL | 0 refills | Status: DC | PRN

## 2016-07-04 MED ORDER — HYDROCODONE-ACETAMINOPHEN 7.5-325 MG/15ML PO SOLN: 15.0000 mL | ORAL | 0 refills | Status: DC | PRN

## 2016-07-04 MED ORDER — PANTOPRAZOLE SODIUM 20 MG PO TBEC: 20.0000 mg | DELAYED_RELEASE_TABLET | Freq: Every day | ORAL | 0 refills | Status: AC

## 2016-07-04 NOTE — Progress Notes (Signed)
Patient was given discharge instructions and prescriptions. Patient had all of his questions answered. NT took pt to main entrance via wheelchair.

## 2016-07-04 NOTE — Progress Notes (Signed)
Visited patient before discharge to discuss discharge diet and fluid intake. Tolerating both clear and full liquids without difficulty per patient.  Patient able to tell this nurse fluid and diet requirements at discharge.  Discussed post op nutrition consult would be next time diet changed.  Patient stated understanding.  Office phone number and e-mail provided to patient to follow up with any questions or concerns.

## 2016-07-04 NOTE — Discharge Summary (Signed)
Physician Discharge Summary  Patient ID: Nathaniel Chambers MRN: [id number] DOB/AGE: 1966-01-04 51 y.o.  Admit date: 07/01/2016 Discharge date: 07/04/2016  Admission Diagnoses:  lapband failure  Discharge Diagnoses:  same  Active Problems:   S/P laparoscopic sleeve gastrectomy   Surgery:  Removal of lapband and conversion to sleeve gastrectomy  Discharged Condition: improved  Hospital Course:   Had surgery. Some nausea at first and then diet tolerated.  Mobility issues related to his desmoid tumor of his lower extremity.;  Ready for discharge today  Consults: none  Significant Diagnostic Studies: none    Discharge Exam: Blood pressure (!) 159/86, pulse 86, temperature 98.8 F (37.1 C), temperature source Oral, resp. rate 18, height 6\' 2"  (1.88 m), weight (!) 145 kg (319 lb 9.6 oz), SpO2 97 %. Incisions ok  Disposition: 01-Home or Self Care  Discharge Instructions    Ambulate hourly while awake    Complete by:  As directed    Call MD for:  difficulty breathing, headache or visual disturbances    Complete by:  As directed    Call MD for:  persistant dizziness or light-headedness    Complete by:  As directed    Call MD for:  persistant nausea and vomiting    Complete by:  As directed    Call MD for:  redness, tenderness, or signs of infection (pain, swelling, redness, odor or green/yellow discharge around incision site)    Complete by:  As directed    Call MD for:  severe uncontrolled pain    Complete by:  As directed    Call MD for:  temperature >101 F    Complete by:  As directed    Diet bariatric full liquid    Complete by:  As directed    Incentive spirometry    Complete by:  As directed    Perform hourly while awake     Allergies as of 07/04/2016      Reactions   Penicillins Shortness Of Breath, Swelling   Has patient had a PCN reaction causing immediate rash, facial/tongue/throat swelling, SOB or lightheadedness with hypotension: Yes Has patient had a PCN reaction  causing severe rash involving mucus membranes or skin necrosis: No Has patient had a PCN reaction that required hospitalization No Has patient had a PCN reaction occurring within the last 10 years: No If all of the above answers are "NO", then may proceed with Cephalosporin use. Swelling of uvula; tolerates augmentin   Solu-medrol [methylprednisolone Acetate] Anaphylaxis   Dilaudid [hydromorphone Hcl] Nausea And Vomiting   Oxycodone Nausea And Vomiting   Takes phenergan to alleviate symptoms   Percocet [oxycodone-acetaminophen] Nausea And Vomiting      Medication List    TAKE these medications   albuterol 108 (90 Base) MCG/ACT inhaler Commonly known as:  PROVENTIL HFA;VENTOLIN HFA Inhale 2 puffs into the lungs every 6 (six) hours as needed for wheezing or shortness of breath (wheezing).   celecoxib 100 MG capsule Commonly known as:  CELEBREX Take 1 capsule by mouth 2 (two) times daily.   Fluticasone-Salmeterol 250-50 MCG/DOSE Aepb Commonly known as:  ADVAIR Inhale 1 puff into the lungs 2 (two) times daily.   gabapentin 600 MG tablet Commonly known as:  NEURONTIN Take 2 tablets (1,200 mg total) by mouth at bedtime. What changed:  Another medication with the same name was changed. Make sure you understand how and when to take each.   gabapentin 300 MG capsule Commonly known as:  NEURONTIN Take 3 capsules (  900 mg total) by mouth at bedtime. What changed:  when to take this   hydrochlorothiazide 12.5 MG capsule Commonly known as:  MICROZIDE Take 12.5 mg by mouth every morning. Notes to patient:  Monitor Blood Pressure Daily and keep a log for primary care physician.  Monitor for symptoms of dehydration.  You may need to make changes to your medications with rapid weight loss.     HYDROcodone-acetaminophen 7.5-325 mg/15 ml solution Commonly known as:  HYCET Take 15 mLs by mouth every 4 (four) hours as needed for moderate pain.   ibuprofen 800 MG tablet Commonly known as:   ADVIL,MOTRIN Take 1 tablet (800 mg total) by mouth daily as needed.   nortriptyline 10 MG capsule Commonly known as:  PAMELOR Start nortriptyline 10mg  at bedtime for 2 week, then increase to 2 tablet at bedtime What changed:  how much to take  how to take this  when to take this  additional instructions   ondansetron 4 MG tablet Commonly known as:  ZOFRAN Take 1 tablet (4 mg total) by mouth every 8 (eight) hours as needed for nausea or vomiting.   pantoprazole 20 MG tablet Commonly known as:  PROTONIX Take 1 tablet (20 mg total) by mouth daily.   polyethylene glycol packet Commonly known as:  MIRALAX / GLYCOLAX Take 17 g by mouth daily. What changed:  when to take this  reasons to take this   promethazine 12.5 MG tablet Commonly known as:  PHENERGAN Take 1 tablet (12.5 mg total) by mouth every 6 (six) hours as needed for nausea or vomiting.   tamoxifen 20 MG tablet Commonly known as:  NOLVADEX TAKE 1 TABLET (20 MG TOTAL) BY MOUTH DAILY. What changed:  additional instructions   Vitamin D (Ergocalciferol) 50000 units Caps capsule Commonly known as:  DRISDOL Take 50,000 Units by mouth every 7 (seven) days. Days of week vary.      Follow-up Information    Pedro Earls, MD Follow up on 07/22/2016.   Specialty:  General Surgery Why:  at 345 pm Contact information: [place] [address] [phone]           Signed: Pedro Earls 07/04/2016, 9:36 AM

## 2016-07-09 NOTE — Telephone Encounter (Signed)
Made discharge phone call to patient. Asking the following questions.    1. Do you have someone to care for you now that you are home?  Independent at home with lives with patient and driving patient to appointments 2. Are you having pain now that is not relieved by your pain medication?  Only complaints is of feet numbness not been taking gabapentin 3. Are you able to drink the recommended daily amount of fluids (48 ounces minimum/day) and protein (60-80 grams/day) as prescribed by the dietitian or nutritional counselor? Fluids  Around 67  Ounces and at 75 grams of protein with 2 premiere shakes and triple zero yogurt  4. Are you taking the vitamins and minerals as prescribed?  Has been taking multivitamin not the calcium wife to pick up calcium today per patient 5. Do you have the "on call" number to contact your surgeon if you have a problem or question?yes calling today for zofran because promethazine makes him feel bad and tired   6. Are your incisions free of redness, swelling or drainage? (If steri strips, address that these can fall off, shower as tolerated) no problems 7. Have your bowels moved since your surgery?  If not, are you passing gas?  Yes hard at first now looser 8. Are you up and walking 3-4 times per day?  Yes moving around his home, feels wiped out at times dizzy.  Instructed patient to wait before standing. To follow up with primary MD. Patient to see Dr Alen Blew oncologist tomorrow am.  Also reminded patient to continue to monitor bp. 9. Were you provided your discharge medications before your surgery or before you were discharged from the hospital and are you taking them without problem?  Yes

## 2016-07-10 ENCOUNTER — Telehealth: Payer: Self-pay | Admitting: Oncology

## 2016-07-10 ENCOUNTER — Ambulatory Visit (HOSPITAL_BASED_OUTPATIENT_CLINIC_OR_DEPARTMENT_OTHER): Payer: BLUE CROSS/BLUE SHIELD | Admitting: Oncology

## 2016-07-10 VITALS — BP 122/86 | HR 103 | Temp 97.9°F | Resp 18 | Ht 74.0 in | Wt 309.8 lb

## 2016-07-10 DIAGNOSIS — M79604 Pain in right leg: Secondary | ICD-10-CM

## 2016-07-10 DIAGNOSIS — G8929 Other chronic pain: Secondary | ICD-10-CM | POA: Diagnosis not present

## 2016-07-10 DIAGNOSIS — D481 Neoplasm of uncertain behavior of connective and other soft tissue: Secondary | ICD-10-CM | POA: Diagnosis not present

## 2016-07-10 DIAGNOSIS — M79605 Pain in left leg: Secondary | ICD-10-CM

## 2016-07-10 NOTE — Progress Notes (Signed)
Hematology and Oncology Follow Up Visit  GREGORI ABRIL [id number] November 12, 1965 51 y.o. 07/10/2016 2:01 PM GATES,DONNA Rod Holler, MDGates, Butch Penny, MD   Principle Diagnosis: 51 year old gentleman with left lower extremity desmoid fibromatosis diagnosed in 2015. He presented with pain and MRI showed 2 separate masses involving the left thigh.   Prior Therapy: He underwent resection initially 2015 under the care of Dr. Leonides Schanz with positive margins. He underwent a reexcision in May 2016 with evidence of recurrence at that time. He received adjuvant external beam radiation between September and November 2016. He has been on active surveillance since that time    Current therapy: Observation and surveillance.  Interim History: Mr. Poplaski presents today for a follow-up visit. Since his last visit he underwent weight reduction surgery which constituted a removal of lap band and conversion to a sleeve gastrectomy completed on 07/04/2016. Since his discharge, he has been doing reasonably well without any major complications. He does report some mild nausea and some fatigue. 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 He has been taking tamoxifen which has not been successful at this time. He denied any complications related to it at this time.  He is not report any constitutional symptoms of fevers or chills or sweats. He does not report any headaches, blurry vision, syncope or seizures. He does not report any fevers or chills or sweats. He does not report any cough, wheezing or hemoptysis. He does not report any nausea, vomiting or abdominal pain. He does not report any frequency urgency or hesitancy. Remainder review of systems unremarkable.   Medications: I have reviewed the patient's current medications.  Current Outpatient Prescriptions  Medication Sig Dispense Refill  . albuterol (PROVENTIL HFA;VENTOLIN HFA) 108 (90 BASE) MCG/ACT inhaler Inhale 2 puffs into the lungs every 6  (six) hours as needed for wheezing or shortness of breath (wheezing).     . celecoxib (CELEBREX) 100 MG capsule Take 1 capsule by mouth 2 (two) times daily.     . Fluticasone-Salmeterol (ADVAIR) 250-50 MCG/DOSE AEPB Inhale 1 puff into the lungs 2 (two) times daily.     Marland Kitchen gabapentin (NEURONTIN) 300 MG capsule Take 3 capsules (900 mg total) by mouth at bedtime. (Patient taking differently: Take 900 mg by mouth every evening. ) 270 capsule 5  . gabapentin (NEURONTIN) 600 MG tablet Take 2 tablets (1,200 mg total) by mouth at bedtime. 180 tablet 3  . hydrochlorothiazide (MICROZIDE) 12.5 MG capsule Take 12.5 mg by mouth every morning.  1  . HYDROcodone-acetaminophen (HYCET) 7.5-325 mg/15 ml solution Take 15 mLs by mouth every 4 (four) hours as needed for moderate pain. 300 mL 0  . ibuprofen (ADVIL,MOTRIN) 800 MG tablet Take 1 tablet (800 mg total) by mouth daily as needed. (Patient not taking: Reported on 06/22/2016) 90 tablet 0  . nortriptyline (PAMELOR) 10 MG capsule Start nortriptyline 10mg  at bedtime for 2 week, then increase to 2 tablet at bedtime (Patient taking differently: Take 10 mg by mouth at bedtime. ) 60 capsule 5  . ondansetron (ZOFRAN) 4 MG tablet Take 1 tablet (4 mg total) by mouth every 8 (eight) hours as needed for nausea or vomiting. 30 tablet 2  . pantoprazole (PROTONIX) 20 MG tablet Take 1 tablet (20 mg total) by mouth daily. 60 tablet 0  . polyethylene glycol (MIRALAX / GLYCOLAX) packet Take 17 g by mouth daily. (Patient taking differently: Take 17 g by mouth daily as needed for mild constipation. ) 14 each 0  .  promethazine (PHENERGAN) 12.5 MG tablet Take 1 tablet (12.5 mg total) by mouth every 6 (six) hours as needed for nausea or vomiting. 30 tablet 0  . tamoxifen (NOLVADEX) 20 MG tablet TAKE 1 TABLET (20 MG TOTAL) BY MOUTH DAILY. (Patient taking differently: Take 20 mg by mouth daily. 1800) 60 tablet 0  . Vitamin D, Ergocalciferol, (DRISDOL) 50000 UNITS CAPS capsule Take 50,000 Units  by mouth every 7 (seven) days. Days of week vary.     No current facility-administered medications for this visit.      Allergies:  Allergies  Allergen Reactions  . Penicillins Shortness Of Breath and Swelling    Has patient had a PCN reaction causing immediate rash, facial/tongue/throat swelling, SOB or lightheadedness with hypotension: Yes Has patient had a PCN reaction causing severe rash involving mucus membranes or skin necrosis: No Has patient had a PCN reaction that required hospitalization No Has patient had a PCN reaction occurring within the last 10 years: No If all of the above answers are "NO", then may proceed with Cephalosporin use.  Swelling of uvula; tolerates augmentin  . Solu-Medrol [Methylprednisolone Acetate] Anaphylaxis  . Dilaudid [Hydromorphone Hcl] Nausea And Vomiting  . Oxycodone Nausea And Vomiting    Takes phenergan to alleviate symptoms  . Percocet [Oxycodone-Acetaminophen] Nausea And Vomiting    Past Medical History, Surgical history, Social history, and Family History were reviewed and updated.  Physical Exam: Blood pressure 122/86, pulse (!) 103, temperature 97.9 F (36.6 C), temperature source Oral, resp. rate 18, height 6\' 2"  (1.88 m), weight (!) 309 lb 12.8 oz (140.5 kg), SpO2 99 %. ECOG: 0 General appearance: alert and cooperative appeared without distress. Head: Normocephalic, without obvious abnormality Neck: no adenopathy Lymph nodes: Cervical, supraclavicular, and axillary nodes normal. Heart:regular rate and rhythm, S1, S2 normal, no murmur, click, rub or gallop Lung:chest clear, no wheezing, rales, normal symmetric air entry Abdomin: soft, non-tender, without masses or organomegaly EXT: Increase in the size of his left knee tumor. Slightly tender to touch but no erythema or induration.   Lab Results: Lab Results  Component Value Date   WBC 12.6 (H) 07/03/2016   HGB 13.7 07/03/2016   HCT 41.6 07/03/2016   MCV 87.0 07/03/2016   PLT  295 07/03/2016     Chemistry      Component Value Date/Time   NA 138 06/26/2016 0845   K 4.0 06/26/2016 0845   CL 104 06/26/2016 0845   CO2 26 06/26/2016 0845   BUN 13 06/26/2016 0845   CREATININE 1.21 06/26/2016 0845      Component Value Date/Time   CALCIUM 9.5 06/26/2016 0845   ALKPHOS 50 06/16/2014 0500   AST 38 (H) 06/16/2014 0500   ALT 38 06/16/2014 0500   BILITOT 0.4 06/16/2014 0500      I Impression and Plan:   51 year old gentleman with the following issues:  1. Left leg desmoid fibromatosis diagnosed in 2015. He is status post resection on multiple occasions most recent of which in 2016 followed by adjuvant radiation therapy.    MRI in December 2017 showed new tumor noted on his left knee. He is case was discussed in the sarcoma multidisciplinary tumor board. Imaging studies were reviewed. His options would include surgical resection which we will leave his knee very unstable. Radiation therapy could be also an option and likely reserved as a salvage therapy.  He has been taking tamoxifen for the last 3 months without improvement in his symptoms with increased pain and the size  of that tumor. The plan is to repeat MRI at this time and we'll refer him to Dr. Tammi Klippel for potential radiation therapy as well as Dr. Rolanda Jay for reevaluation for possible surgical resection.  2. Chronic pain: Pain is manageable at this time. He has pain predominantly in the left knee as well as the right knee.  3. Follow-up: To be determined depending on the results of his MRI and a multidisciplinary discussion.  [facility], MD 3/16/20182:01 PM

## 2016-07-10 NOTE — Telephone Encounter (Signed)
Gave patient AVS and schedule per 07/10/2016 los. MRI scheduled with [facility] in [facility] for 3/21. Email sent to MD , patient is requesting if it could be done without contrast injection. To follow up with patient if changes are able to be made.

## 2016-07-15 ENCOUNTER — Ambulatory Visit (HOSPITAL_COMMUNITY)
Admission: RE | Admit: 2016-07-15 | Discharge: 2016-07-15 | Disposition: A | Discharge status: Home / Self Care | Payer: BLUE CROSS/BLUE SHIELD | Source: Ambulatory Visit | Attending: Oncology | Admitting: Oncology

## 2016-07-15 DIAGNOSIS — D481 Neoplasm of uncertain behavior of connective and other soft tissue: Secondary | ICD-10-CM | POA: Insufficient documentation

## 2016-07-15 LAB — POCT I-STAT CREATININE: Creatinine, Ser: 1.3 mg/dL — ABNORMAL HIGH (ref 0.61–1.24)

## 2016-07-15 MED ORDER — GADOBENATE DIMEGLUMINE 529 MG/ML IV SOLN
20.0000 mL | Freq: Once | INTRAVENOUS | Status: AC | PRN
  Administered 2016-07-15: 20 mL via INTRAVENOUS

## 2016-07-21 ENCOUNTER — Encounter: Payer: BLUE CROSS/BLUE SHIELD | Admitting: Skilled Nursing Facility1

## 2016-07-21 DIAGNOSIS — Z6841 Body Mass Index (BMI) 40.0 and over, adult: Secondary | ICD-10-CM | POA: Diagnosis not present

## 2016-07-21 DIAGNOSIS — Z7951 Long term (current) use of inhaled steroids: Secondary | ICD-10-CM | POA: Diagnosis not present

## 2016-07-21 DIAGNOSIS — Z880 Allergy status to penicillin: Secondary | ICD-10-CM | POA: Diagnosis not present

## 2016-07-21 DIAGNOSIS — Z9884 Bariatric surgery status: Secondary | ICD-10-CM | POA: Diagnosis not present

## 2016-07-21 DIAGNOSIS — Z713 Dietary counseling and surveillance: Secondary | ICD-10-CM | POA: Diagnosis not present

## 2016-07-21 DIAGNOSIS — Z79899 Other long term (current) drug therapy: Secondary | ICD-10-CM | POA: Diagnosis not present

## 2016-07-21 DIAGNOSIS — Z885 Allergy status to narcotic agent status: Secondary | ICD-10-CM | POA: Diagnosis not present

## 2016-07-21 DIAGNOSIS — E669 Obesity, unspecified: Secondary | ICD-10-CM

## 2016-07-21 DIAGNOSIS — Z888 Allergy status to other drugs, medicaments and biological substances status: Secondary | ICD-10-CM | POA: Diagnosis not present

## 2016-07-22 ENCOUNTER — Other Ambulatory Visit: Payer: Self-pay | Admitting: *Deleted

## 2016-07-22 MED ORDER — TAMOXIFEN CITRATE 20 MG PO TABS: 20.0000 mg | ORAL_TABLET | Freq: Every day | ORAL | 0 refills | Status: DC

## 2016-07-23 ENCOUNTER — Encounter: Payer: Self-pay | Admitting: Skilled Nursing Facility1

## 2016-07-23 NOTE — Progress Notes (Signed)
Bariatric Class:  Appt start time: 1530 end time:  1630.  2 Week Post-Operative Nutrition Class  Patient was seen on 07/21/2016 for Post-[facility]ative [facility]  Pt states he has been feeling weak and has a knee surgery in a few days.  Surgery date: 07/01/2016 Surgery type: Sleeve Gastrectomy  Start weight at [facility]: 343.10 Weight today: 300.9 Weight change: 42.2  TANITA  BODY COMP RESULTS     BMI (kg/m^2)    Fat Mass (lbs)    Fat Free Mass (lbs)    Total Body Water (lbs)    The following the learning objectives were met by the patient during this course:  Identifies Phase 3A (Soft, High Proteins) Dietary Goals and will begin from 2 weeks post-operatively to 2 months post-operatively  Identifies appropriate sources of fluids and proteins   States protein recommendations and appropriate sources post-operatively  Identifies the need for appropriate texture modifications, mastication, and bite sizes when consuming solids  Identifies appropriate multivitamin and calcium sources post-operatively  Describes the need for physical activity post-operatively and will follow MD recommendations  States when to call healthcare provider regarding medication questions or post-operative complications  Handouts given during class include:  Phase 3A: Soft, High Protein Diet Handout  Follow-Up Plan: Patient will follow-up at [facility] in 6 weeks for 2 month post-op nutrition visit for diet advancement per MD.

## 2016-07-30 ENCOUNTER — Other Ambulatory Visit: Payer: Self-pay | Admitting: Neurology

## 2016-08-06 ENCOUNTER — Encounter: Payer: Self-pay | Admitting: *Deleted

## 2016-08-06 ENCOUNTER — Other Ambulatory Visit: Payer: Self-pay | Admitting: *Deleted

## 2016-08-06 MED ORDER — TAMOXIFEN CITRATE 20 MG PO TABS
20.0000 mg | ORAL_TABLET | Freq: Every day | ORAL | 2 refills | Status: DC
Start: 2016-08-06 — End: 2017-02-17

## 2016-08-26 ENCOUNTER — Encounter: Payer: BLUE CROSS/BLUE SHIELD | Attending: Surgery | Admitting: Skilled Nursing Facility1

## 2016-08-26 ENCOUNTER — Encounter: Payer: Self-pay | Admitting: Skilled Nursing Facility1

## 2016-08-26 DIAGNOSIS — Z713 Dietary counseling and surveillance: Secondary | ICD-10-CM | POA: Insufficient documentation

## 2016-08-26 DIAGNOSIS — Z885 Allergy status to narcotic agent status: Secondary | ICD-10-CM | POA: Insufficient documentation

## 2016-08-26 DIAGNOSIS — Z9884 Bariatric surgery status: Secondary | ICD-10-CM | POA: Insufficient documentation

## 2016-08-26 DIAGNOSIS — Z7951 Long term (current) use of inhaled steroids: Secondary | ICD-10-CM | POA: Diagnosis not present

## 2016-08-26 DIAGNOSIS — Z880 Allergy status to penicillin: Secondary | ICD-10-CM | POA: Insufficient documentation

## 2016-08-26 DIAGNOSIS — Z6841 Body Mass Index (BMI) 40.0 and over, adult: Secondary | ICD-10-CM | POA: Insufficient documentation

## 2016-08-26 DIAGNOSIS — Z888 Allergy status to other drugs, medicaments and biological substances status: Secondary | ICD-10-CM | POA: Insufficient documentation

## 2016-08-26 DIAGNOSIS — Z79899 Other long term (current) drug therapy: Secondary | ICD-10-CM | POA: Insufficient documentation

## 2016-08-26 DIAGNOSIS — E669 Obesity, unspecified: Secondary | ICD-10-CM

## 2016-08-26 NOTE — Patient Instructions (Addendum)
-  Max your sugar free popcicles out at 2  -Ask your surgeon about a heart burn medication   -Always eat your protein first then start in on your vegetables   -Keep chewing well  -Keep up the great work!!

## 2016-08-26 NOTE — Progress Notes (Signed)
Follow-up visit:  8 Weeks Post-Operative Sleeve Gastrectomy Surgery  Medical Nutrition Therapy:  Appt start time: 9:58 end time:  10:30  Primary concerns today: Post-operative Bariatric Surgery Nutrition Management.  Pt arrived having just had knee surgery. Sister in Nurse, adult. Pt states he has to remember to eat. Pt states his daughter works at [employer] and brings home fried chicken but he does not eat it because he knows he cannot handle grease. Pt states he Wants all his beverages ice cold. Pt states he Tried lemonade which was too sweet. Pt states he does not like salmon or oils. Pt had Heavy mucous in his mouth while talking.    Goal: from 346 pounds to 278 then to 280 Surgery date: 07/01/2016 Surgery type: Sleeve Gastrectomy  Start weight at [facility]: 343.10 Weight today: 300.9 pt declined  Weight change: 42.2  TANITA  BODY COMP RESULTS  declined   BMI (kg/m^2)    Fat Mass (lbs)    Fat Free Mass (lbs)    Total Body Water (lbs)     24-hr recall: B (AM): 1-2 eggs with [place] meat (8-17) sometimes adding cheese Snk (AM): unsweetened applesauce  L (PM): [place] burger (20g) Snk (PM): mandarin oranges----string cheese (7g) D (PM): tuna fish (3-4 T at a time) (14g) Snk (PM): (5-6) sugar free popcicle   Fluid intake: sugar free popcicle, crystal light water: 40-50 ounces Estimated total protein intake: 58g  Medications: see list Supplementation: celebrate and tums calcium  Using straws: no Drinking while eating: no Having you been chewing well: yes Chewing/swallowing difficulties: no Changes in vision: no Changes to mood/headaches: no Hair loss/Changes to skin/Changes to nails: no Any difficulty focusing or concentrating: no Sweating: no Dizziness/Lightheaded: here and there when standing too fast Palpitations: no  Carbonated beverages: no N/V/D/C/GAS: a little heart burn a little constipation better with miralax, here and there for diarrhea occurred with marsala sauce  (3 days) Abdominal Pain: no Dumping syndrome: no  Recent physical activity:  New knee surgery   Progress Towards Goal(s):  In progress.  Handouts given during visit include:  Ns veggies + protein   Nutritional Diagnosis:  [place]-3.3 Overweight/obesity related to past poor dietary habits and physical inactivity as evidenced by patient w/ recent sleeve gastrectomy surgery following dietary guidelines for continued weight loss. Goals: -Max your sugar free popcicles out at 2 -Ask your surgeon about a heart burn medication  -Always eat your protein first then start in on your vegetables  -Keep chewing well -Keep up the great work!!  Intervention:  Nutrition counseling. Dietitian educated the pt on advancing his diet to include non-starchy vegetables   Teaching Method Utilized:  Visual Auditory Hands on  Barriers to learning/adherence to lifestyle change: none identified   Demonstrated degree of understanding via:  Teach Back   Monitoring/Evaluation:  Dietary intake, exercise, lap band fills, and body weight.

## 2016-11-04 ENCOUNTER — Encounter: Payer: BLUE CROSS/BLUE SHIELD | Attending: Surgery | Admitting: Skilled Nursing Facility1

## 2016-11-04 ENCOUNTER — Encounter: Payer: Self-pay | Admitting: Skilled Nursing Facility1

## 2016-11-04 DIAGNOSIS — Z888 Allergy status to other drugs, medicaments and biological substances status: Secondary | ICD-10-CM | POA: Diagnosis not present

## 2016-11-04 DIAGNOSIS — Z79899 Other long term (current) drug therapy: Secondary | ICD-10-CM | POA: Insufficient documentation

## 2016-11-04 DIAGNOSIS — Z7951 Long term (current) use of inhaled steroids: Secondary | ICD-10-CM | POA: Insufficient documentation

## 2016-11-04 DIAGNOSIS — Z9884 Bariatric surgery status: Secondary | ICD-10-CM | POA: Diagnosis not present

## 2016-11-04 DIAGNOSIS — Z713 Dietary counseling and surveillance: Secondary | ICD-10-CM | POA: Insufficient documentation

## 2016-11-04 DIAGNOSIS — Z6841 Body Mass Index (BMI) 40.0 and over, adult: Secondary | ICD-10-CM | POA: Insufficient documentation

## 2016-11-04 DIAGNOSIS — Z885 Allergy status to narcotic agent status: Secondary | ICD-10-CM | POA: Diagnosis not present

## 2016-11-04 DIAGNOSIS — Z880 Allergy status to penicillin: Secondary | ICD-10-CM | POA: Insufficient documentation

## 2016-11-04 DIAGNOSIS — E669 Obesity, unspecified: Secondary | ICD-10-CM

## 2016-11-04 NOTE — Progress Notes (Signed)
Follow-up visit:  8 Weeks Post-Operative Sleeve Gastrectomy Surgery  Medical Nutrition Therapy:  Appt start time: 9:58 end time:  10:30  Primary concerns today: Post-operative Bariatric Surgery Nutrition Management. Pt states he is in physical therapy. Pt states he has been using butter for his eggs lately which has made him feel overly full. Pt states he has a bowel movement a couple times a day and uses the miralax to make the stool softer. Pt states he has decreased his popcicle intake. Pt states his goal is to be in the baggy 38. Pt spent most of the appt venting about how he wished his progress were going faster but states at the end of the appt he feels better now that he has vented. Pt states he has another surgery to remove the tumor from his leg and will also get it radiated.  Pt states his goal is To be 210 pounds.   Goal: from 346 pounds to 278 then to 280 Surgery date: 07/01/2016 Surgery type: Sleeve Gastrectomy  Start weight at [facility]: 346 Weight today: 263 Weight change: 83  TANITA  BODY COMP RESULTS  11/04/2016   BMI (kg/m^2) 33.6   Fat Mass (lbs) 81.2   Fat Free Mass (lbs) 181.8   Total Body Water (lbs) 125.8    24-hr recall: B (AM): 2 eggs with [place] meat (8-17) sometimes adding cheese Snk (AM): unsweetened applesauce  L (PM): yogurt and cheesestick Snk (PM): mandarin oranges----string cheese (7g) D (PM): [place] burger (3-4 T at a time) (14g) Snk (PM): (3-4) sugar free popcicle   Fluid intake: sugar free popcicle, crystal light water: 3-4 40 ounces: 120 ounces, coffee with splenda  Estimated total protein intake: 58g  Medications: see list Supplementation: celebrate and tums calcium  Using straws: no Drinking while eating: no Having you been chewing well: yes Chewing/swallowing difficulties: no Changes in vision: no Changes to mood/headaches: no Hair loss/Changes to skin/Changes to nails: no Any difficulty focusing or concentrating: no Sweating:  no Dizziness/Lightheaded: here and there when standing too fast Palpitations: no  Carbonated beverages: no N/V/D/C/GAS: a little heart burn a little constipation better with miralax, here and there for diarrhea occurred with marsala sauce (3 days) Abdominal Pain: no Dumping syndrome: no  Recent physical activity:  New knee surgery   Progress Towards Goal(s):  In progress.  Handouts given during visit include:  Ns veggies + protein   Nutritional Diagnosis:  [place]-3.3 Overweight/obesity related to past poor dietary habits and physical inactivity as evidenced by patient w/ recent sleeve gastrectomy surgery following dietary guidelines for continued weight loss. Goals: -Max your sugar free popcicles out at 2 -Always eat your protein first then start in on your vegetables  -Keep chewing well -Keep up the great work!!  Intervention:  Nutrition counseling. Dietitian educated the pt on only having one fruit a day and while he is laid up from surgery no fruit due to him wanting to keep his weight moving forward in loss.   Teaching Method Utilized:  Visual Auditory Hands on  Barriers to learning/adherence to lifestyle change: none identified   Demonstrated degree of understanding via:  Teach Back   Monitoring/Evaluation:  Dietary intake, exercise, lap band fills, and body weight.

## 2016-12-04 ENCOUNTER — Ambulatory Visit: Payer: BLUE CROSS/BLUE SHIELD | Admitting: Neurology

## 2016-12-29 ENCOUNTER — Emergency Department (HOSPITAL_COMMUNITY)
Admission: EM | Admit: 2016-12-29 | Discharge: 2016-12-29 | Disposition: A | Discharge status: Home / Self Care | Payer: BLUE CROSS/BLUE SHIELD | Attending: Emergency Medicine | Admitting: Emergency Medicine

## 2016-12-29 ENCOUNTER — Emergency Department (HOSPITAL_BASED_OUTPATIENT_CLINIC_OR_DEPARTMENT_OTHER)
Admit: 2016-12-29 | Discharge: 2016-12-29 | Disposition: A | Discharge status: Home / Self Care | Payer: BLUE CROSS/BLUE SHIELD

## 2016-12-29 ENCOUNTER — Encounter (HOSPITAL_COMMUNITY): Payer: Self-pay

## 2016-12-29 DIAGNOSIS — J45909 Unspecified asthma, uncomplicated: Secondary | ICD-10-CM | POA: Diagnosis not present

## 2016-12-29 DIAGNOSIS — M79605 Pain in left leg: Secondary | ICD-10-CM | POA: Diagnosis not present

## 2016-12-29 DIAGNOSIS — R609 Edema, unspecified: Secondary | ICD-10-CM | POA: Diagnosis not present

## 2016-12-29 DIAGNOSIS — Z79899 Other long term (current) drug therapy: Secondary | ICD-10-CM | POA: Diagnosis not present

## 2016-12-29 DIAGNOSIS — Z87891 Personal history of nicotine dependence: Secondary | ICD-10-CM | POA: Insufficient documentation

## 2016-12-29 DIAGNOSIS — M7989 Other specified soft tissue disorders: Secondary | ICD-10-CM

## 2016-12-29 DIAGNOSIS — Z859 Personal history of malignant neoplasm, unspecified: Secondary | ICD-10-CM | POA: Insufficient documentation

## 2016-12-29 DIAGNOSIS — I10 Essential (primary) hypertension: Secondary | ICD-10-CM | POA: Diagnosis not present

## 2016-12-29 DIAGNOSIS — R2242 Localized swelling, mass and lump, left lower limb: Secondary | ICD-10-CM | POA: Diagnosis present

## 2016-12-29 MED ORDER — MORPHINE SULFATE (PF) 4 MG/ML IV SOLN
4.0000 mg | Freq: Once | INTRAVENOUS | Status: AC
  Administered 2016-12-29: 4 mg via INTRAVENOUS
  Filled 2016-12-29: qty 1

## 2016-12-29 MED ORDER — ONDANSETRON HCL 4 MG/2ML IJ SOLN
4.0000 mg | Freq: Once | INTRAMUSCULAR | Status: AC
  Administered 2016-12-29: 4 mg via INTRAVENOUS
  Filled 2016-12-29: qty 2

## 2016-12-29 NOTE — ED Provider Notes (Signed)
[facility] DEPT Provider Note   CSN: [id number] Arrival date & time: 12/29/16  1424     History   Chief Complaint Chief Complaint  Patient presents with  . Leg Swelling    HPI Nathaniel Chambers is a 51 y.o. male with a PMHx of desmoid fibromatosis, asthma, diverticulitis, meralgia paresthetica, HTN, and other medical conditions listed below, with recent L calf desmoid fibromatosis removal on 11/25/16 by Dr. Gwyndolyn Saxon Ward at [facility] (d/c'd on ASA 325mg  BID x3wks), who presents to the ED with complaints of LLE swelling x4 days. Patient states that he drove to [place] on Friday 12/25/16, which was a 9 Hour drive however he stopped every few hours to ambulate (using a cane), but by the time he arrived to his destination, he noticed his LLE was swollen. He iced and elevated it, which helped moderately, however it didn't completely go down. By the time he arrived back home yesterday afternoon, after another 9hr drive (again, stopped every few hours to ambulate), the leg was more swollen. He called his orthopedist who advised him to come here to r/o DVT. He has been on ASA 325mg  BID for DVT prophylaxis, however he admits that he's missed the occasional dose here and there, and didn't realize the reason he was on ASA was for DVT prophylaxis (he thought it was for pain). He states he's been trying to ambulate more and "get back to his normal activities" lately, so he thought he had just overdone it causing the swelling. He states that being on his feet seem to worsen the swelling, and ice and elevation have helped. He states he continues to have some mild pain in the leg, which is the same as it was since after the surgery, denies any worsening/changes. He also has numbness/paresthesias which are unchanged from his first surgery multiple years ago. Has been mostly using gabapentin and tizanidine for his pain/paresthesias, and also takes norco as needed for pain but hasn't used this in 4-5 days. He denies  erythema/warmth to the leg, drainage from the surgical incision, fevers, chills, CP, SOB, abd pain, N/V/D/C, hematuria, dysuria, new/worsening numbness/tingling, focal weakness, or any other complaints at this time. No family hx of DVT/PE.    The history is provided by the patient and medical records. No language interpreter was used.    Past Medical History:  Diagnosis Date  . Asthma   . Cancer [facility])    Desmoid fibromatosis  . 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 Leonides Schanz  . Diverticulitis   . Hypertension   . Meralgia paraesthetica March 2015   Bilaterally  . Pneumonia    history of  . S/P radiation therapy 01/14/2015 through 02/20/2015    Left posterior thighs/inferior pelvis 5600 cGy in 28 sessions   . Sleep apnea   . Torn meniscus 2018    Patient Active Problem List   Diagnosis Date Noted  . 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 Desmoid fibromatosis 12/13/2014  . S/P partial colectomy 08/03/2014  . Meralgia paraesthetica 07/12/2014  . Diverticulitis of large intestine without perforation or abscess without bleeding   . Enteritis due to Clostridium difficile   . Obesity 04/10/2014  . Colonic diverticular abscess 04/04/2014  . Sigmoid diverticulitis 04/04/2014  . Elevated lipase 12/19/2013  . Diverticulitis 12/15/2013  . Asthma 12/15/2013  . Fatty liver 12/15/2013    Past Surgical History:  Procedure Laterality Date  . HERNIA REPAIR  08/2008   rt inguinal  . LAPAROSCOPIC GASTRIC  BAND REMOVAL WITH LAPAROSCOPIC GASTRIC SLEEVE RESECTION N/A 07/01/2016   Procedure: LAPAROSCOPIC GASTRIC BAND REMOVAL WITH LAPAROSCOPIC GASTRIC SLEEVE RESECTION;  Surgeon: Johnathan Hausen, MD;  Location: [facility];  Service: General;  Laterality: N/A;  . LAPAROSCOPIC GASTRIC BANDING  04-26-2007  . LAPAROSCOPIC SIGMOID COLECTOMY N/A 08/03/2014   Procedure: LAPAROSCOPIC TAKEDOWN INTRA-ABDOMINAL ABSCESS, SIGMOID  COLECTOMY WITH PRIMARY ANASTAMOSIS, APPENDECTOMY, MOBILIZATION OF SPLENIC FLEXURE;  Surgeon: Johnathan Hausen, MD;  Location: [facility];  Service: General;  Laterality: N/A;  . LEFT SHOULDER SURG  2000   rotator cuff  . Left Thigh Fibromatosis Resection  06/30/13  . LEG SURGERY     cancer  . REPAIR RECURRENT RIGHT INGUINAL HERNIA  10-22-2008  . SHOULDER ARTHROSCOPY  01/2009   rt   . SHOULDER ARTHROSCOPY  07/01/2011   Procedure: ARTHROSCOPY SHOULDER;  Surgeon: Magnus Sinning, MD;  Location: [facility];  Service: Orthopedics;  Laterality: Left;  WITH LABRAL DEBRIDEMENT  . SHOULDER OPEN ROTATOR CUFF REPAIR  07/01/2011   Procedure: ROTATOR CUFF REPAIR SHOULDER OPEN;  Surgeon: Magnus Sinning, MD;  Location: [facility];  Service: Orthopedics;  Laterality: Left;  OPEN DISTAL CLAVICLE REPAIR  . SURG. FOR UNDESCENDED TESTICLES/ BILATERAL INGUINAL HERNIA REPAIR  AGE 16 MON OLD  . TUMOR REMOVAL    . vasectomy         Home Medications    Prior to Admission medications   Medication Sig Start Date End Date Taking? Authorizing Provider  albuterol (PROVENTIL HFA;VENTOLIN HFA) 108 (90 BASE) MCG/ACT inhaler Inhale 2 puffs into the lungs every 6 (six) hours as needed for wheezing or shortness of breath (wheezing).     [provider]  celecoxib (CELEBREX) 100 MG capsule Take 1 capsule by mouth 2 (two) times daily.  04/22/16 04/22/17  [provider]  Fluticasone-Salmeterol (ADVAIR) 250-50 MCG/DOSE AEPB Inhale 1 puff into the lungs 2 (two) times daily.     [provider]  gabapentin (NEURONTIN) 300 MG capsule Take 3 capsules (900 mg total) by mouth at bedtime. Patient taking differently: Take 900 mg by mouth every evening.  06/04/16   Patel, Arvin Collard K, DO  gabapentin (NEURONTIN) 300 MG capsule TAKE 2 CAPSULES BY MOUTH EVERY MORNING AND 4 CAPSULES AT BEDTIME 07/30/16   Patel, Donika K, DO  gabapentin (NEURONTIN) 600 MG tablet Take 2 tablets (1,200 mg total)  by mouth at bedtime. 06/04/16   Narda Amber K, DO  hydrochlorothiazide (MICROZIDE) 12.5 MG capsule Take 12.5 mg by mouth every morning. 06/11/16   [provider]  HYDROcodone-acetaminophen (HYCET) 7.5-325 mg/15 ml solution Take 15 mLs by mouth every 4 (four) hours as needed for moderate pain. 07/04/16   Johnathan Hausen, MD  ibuprofen (ADVIL,MOTRIN) 800 MG tablet Take 1 tablet (800 mg total) by mouth daily as needed. Patient not taking: Reported on 06/22/2016 02/19/16   Joylene John D, NP  nortriptyline (PAMELOR) 10 MG capsule Start nortriptyline 10mg  at bedtime for 2 week, then increase to 2 tablet at bedtime Patient taking differently: Take 10 mg by mouth at bedtime.  06/04/16   Narda Amber K, DO  ondansetron (ZOFRAN) 4 MG tablet Take 1 tablet (4 mg total) by mouth every 8 (eight) hours as needed for nausea or vomiting. 04/23/16   Wyatt Portela, MD  pantoprazole (PROTONIX) 20 MG tablet Take 1 tablet (20 mg total) by mouth daily. 07/04/16   Johnathan Hausen, MD  polyethylene glycol [facility] Floria Raveling) packet Take 17 g by mouth daily. Patient  taking differently: Take 17 g by mouth daily as needed for mild constipation.  12/21/13   Barton Dubois, MD  promethazine (PHENERGAN) 12.5 MG tablet Take 1 tablet (12.5 mg total) by mouth every 6 (six) hours as needed for nausea or vomiting. 07/04/16   Johnathan Hausen, MD  tamoxifen (NOLVADEX) 20 MG tablet Take 1 tablet (20 mg total) by mouth daily. 08/06/16   Wyatt Portela, MD  Vitamin D, Ergocalciferol, (DRISDOL) 50000 UNITS CAPS capsule Take 50,000 Units by mouth every 7 (seven) days. Days of week vary. 06/28/13   [provider]    Family History Family History  Problem Relation Age of Onset  . Heart disease Father   . Cancer Mother        kidney, spleen    Social History Social History  Substance Use Topics  . Smoking status: Former Smoker    Packs/day: 0.25    Years: 1.00    Quit date: 06/25/1988  . Smokeless tobacco: Never  Used     Comment: smoked socially  . Alcohol use No     Allergies   Penicillins; Solu-medrol [methylprednisolone acetate]; Dilaudid [hydromorphone hcl]; Oxycodone; and Percocet [oxycodone-acetaminophen]   Review of Systems Review of Systems  Constitutional: Negative for chills and fever.  Respiratory: Negative for shortness of breath.   Cardiovascular: Positive for leg swelling. Negative for chest pain.  Gastrointestinal: Negative for abdominal pain, constipation, diarrhea, nausea and vomiting.  Genitourinary: Negative for dysuria and hematuria.  Musculoskeletal: Positive for arthralgias (chronic LLE pain since surgery, unchanged). Negative for myalgias.  Skin: Negative for color change.  Allergic/Immunologic: Negative for immunocompromised state.  Neurological: Negative for weakness and numbness.  Psychiatric/Behavioral: Negative for confusion.   All other systems reviewed and are negative for acute change except as noted in the HPI.    Physical Exam Updated Vital Signs BP 126/83 (BP Location: Left Arm)   Pulse 73   Temp 98.4 F (36.9 C) (Oral)   Resp 18   Ht 6\' 2"  (1.88 m)   Wt 112.9 kg (249 lb)   BMI 31.97 kg/m   Physical Exam  Constitutional: He is oriented to person, place, and time. Vital signs are normal. He appears well-developed and well-nourished.  Non-toxic appearance. No distress.  Afebrile, nontoxic, NAD  HENT:  Head: Normocephalic and atraumatic.  Mouth/Throat: Oropharynx is clear and moist and mucous membranes are normal.  Eyes: Conjunctivae and EOM are normal. Right eye exhibits no discharge. Left eye exhibits no discharge.  Neck: Normal range of motion. Neck supple.  Cardiovascular: Normal rate, regular rhythm, normal heart sounds and intact distal pulses.  Exam reveals no gallop and no friction rub.   No murmur heard. Pulmonary/Chest: Effort normal and breath sounds normal. No respiratory distress. He has no decreased breath sounds. He has no wheezes.  He has no rhonchi. He has no rales.  Abdominal: Soft. Normal appearance and bowel sounds are normal. He exhibits no distension. There is no tenderness. There is no rigidity, no rebound, no guarding, no CVA tenderness, no tenderness at McBurney's point and negative Murphy's sign.  Musculoskeletal: Normal range of motion.       Left lower leg: He exhibits tenderness, swelling and edema. He exhibits no bony tenderness, no deformity and no laceration.  L knee and calf with well healed surgical incision to medial aspect which is c/d/i with several steristrips in place, with mild tenderness to popliteal fossa area, and 2-3+ pretibial pitting edema extending to mid-calf area, FROM intact in all  major joints, no erythema or warmth, strength grossly intact, sensation grossly intact/at baseline (diminished sensation to portions of calf which is chronic and unchanged from prior surgery), distal pulses intact, soft compartments.   Neurological: He is alert and oriented to person, place, and time. He has normal strength. No sensory deficit.  Skin: Skin is warm, dry and intact. No rash noted.  Psychiatric: He has a normal mood and affect.  Nursing note and vitals reviewed.    ED Treatments / Results  Labs (all labs ordered are listed, but only abnormal results are displayed) Labs Reviewed - No data to display  EKG  EKG Interpretation None       Radiology No results found.   12/29/16 LLE DVT U/S: Left lower extremity venous duplex has been completed.  Preliminary findings: no obvious evidence of DVT in visualized veins. Landry Mellow, RDMS, RVT    Procedures Procedures (including critical care time)  Medications Ordered in ED Medications  morphine 4 MG/ML injection 4 mg (4 mg Intravenous Given 12/29/16 1735)  ondansetron (ZOFRAN) injection 4 mg (4 mg Intravenous Given 12/29/16 1735)     Initial Impression / Assessment and Plan / ED Course  I have reviewed the triage vital signs and the nursing  notes.  Pertinent labs & imaging results that were available during my care of the patient were reviewed by me and considered in my medical decision making (see chart for details).     51 y.o. male here with LLE swelling x4 days, sent to r/o DVT. Had desmoid fibromatosis tumor removed from L calf 11/25/16, drove to [place] on Fri 12/25/16 and noticed swelling; has been trying to ambulate more with cane, so thought that could be the cause. Iced/elevated and improved slightly, but after return trip yesterday it worsened so he called his orthopedist who advised that he come here to r/o DVT. Denies CP/SOB, no tachycardia or hypoxia, on exam L knee/calf with well healed surgical incision to medial aspect, no erythema/warmth, with 2-3+ pedal edema to mid-calf. NVI with soft compartments. Will get DVT U/S, will get labs just in case he has a DVT and we need to know his kidney function; will give morphine with zofran for pain (pain in leg hasn't changed since his surgery). Will reassess shortly.   9:12 PM DVT U/S neg for DVT. Apparently the labs were lost, then recollected when I asked about why they hadn't resulted yet, and were subsequently lost again; at this point, I do not see a reason to continue waiting on labs or redrawing them since his DVT study has resulted and is negative. LLE swelling likely from his increased ambulation recently. Advised elevation, rest, ice if desired, continue home pain meds, continue ASA, discussed compression stocking use, and f/up with his orthopedist in 3-5 days for recheck of symptoms. Strict return precautions advised. I explained the diagnosis and have given explicit precautions to return to the ER including for any other new or worsening symptoms. The patient understands and accepts the medical plan as it's been dictated and I have answered their questions. Discharge instructions concerning home care and prescriptions have been given. The patient is STABLE and is discharged to home  in good condition.    Final Clinical Impressions(s) / ED Diagnoses   Final diagnoses:  Left leg swelling  Pain of left lower extremity    New Prescriptions New Prescriptions   No medications on [address] [place], [place] 12/29/16 2112    Blanchie Dessert,  MD 01/03/17 1604

## 2016-12-29 NOTE — Discharge Instructions (Signed)
Your ultrasound today showed no evidence of blood clot in the leg. Your swelling is likely related to increased activity/use of the leg. Use ice and elevate the leg to help with pain and swelling. Use compression stockings on the leg to help with swelling. Use your home pain medications as needed for pain. Continue taking 325mg  Aspirin twice daily as directed by your orthopedist, to help prevent blood clots. Follow up with your orthopedist in 3-5 days for recheck of symptoms and ongoing management of your leg issue. Return to the ER for emergent changes or worsening symptoms.

## 2016-12-29 NOTE — ED Triage Notes (Signed)
Patient states he had a tumor removed to the left calf area 11/25/16. Patient has had swelling x 4 days. Patient called the surgeon in [place] and they wanted patient to be seen to r/o DVT.

## 2016-12-29 NOTE — ED Notes (Addendum)
Due to [facility] labs, this nurse called lab to verify status of labs. Was told that the blood never arrived. This nurse personally sent labs down for the patient at 1730. EDPA notified. Labs recollected and sent.

## 2016-12-29 NOTE — ED Notes (Addendum)
This nurse redrew blood and personally sent it to lab at 2045. This nurse called lab to verify receipt of blood, and was told by lab that the blood never arrived. EDPA notified.

## 2016-12-29 NOTE — Progress Notes (Signed)
*  PRELIMINARY RESULTS* Vascular Ultrasound Left lower extremity venous duplex has been completed.  Preliminary findings: no obvious evidence of DVT in visualized veins.   Landry Mellow, RDMS, RVT  12/29/2016, 5:59 PM

## 2017-01-06 ENCOUNTER — Ambulatory Visit: Payer: Self-pay | Admitting: Skilled Nursing Facility1

## 2017-01-22 ENCOUNTER — Encounter: Payer: Self-pay | Admitting: Radiation Oncology

## 2017-01-25 NOTE — Progress Notes (Addendum)
Histology and Location of Primary Skin Cancer: New desmoid fibromatosis left medial calf lower leg  Surgical Pathology 11-25-16  1. Soft tissue, medial left calf, resection: Extra-abdominal desmoid fibromatosis, 10.8 cm. See note. The tumor is 0.5 mm away from the anterior margin, and 1 mm away from the posterior margin.  Note: Immunohistochemical stains show the tumor cells to be positive for desmin, rare patchy positivity for SMA, and to be negative for CD34, and pan-keratin (AE1/3). A beta-catenin immunostain performed at [facility] shows patchy nuclear positivity. The overall tumor morphology, and immunoprofile support the diagnosis.  2. Skeletal muscle and soft tissue, additional deep margin, resection: Skeletal muscle, negative for tumor.  3. Soft tissue, additional proximal margin left lower leg, resection: Fibroadipose tissue, negative for tumor.  4. Soft tissue, additional bone margin left lower leg, resection: Fibroadipose tissue, negative for tumor. Clinical Information Post-op Diagnosis: DESMOID FIBROMATOSIS LEFT LOWER LEG   MRI Tibia Fibula Left WO W IV Contrast 09-28-16  INDICATION: New mass left lower leg, has personal history of desmoid fibromatosis of the left thigh  FINDINGS:  . Bones: Exostosis of the posterior medial tibia measuring 3.5 x 1.2 cm. Small 1 cm pedunculated osteochondroma of the posterior lateral tibial plateau. . Muscles and tendons: Intact without strain or tear . Subcutaneous tissues: Large soft tissue mass in the medial proximal lower leg measuring 11 by a by 5 cm with complex T2-weighted signal comprise the low, intermediate, and high signal elements and bruising areas of fibrosis. There is avid postcontrast  enhancement of this lesion. The lesion is may involve a portion of the pes anserinus tendons.. There is moderate surrounding edema tracking to the level of the mid lower leg. . Visualized joints: Intact  Other Result Information   Acute Interface, Incoming Rad Results - 09/28/2016  3:54 PM EDT TECHNIQUE: Multiplanar multisequence MR imaging of the left lower leg was performed prior to and following the intravenous administration of 20 cc MultiHance gadolinium-based contrast. .  COMPARISON: Outside MRI knee 04/13/2016  INDICATION: New mass left lower leg, has personal history of desmoid fibromatosis of the left thigh  FINDINGS:  . Bones: Exostosis of the posterior medial tibia measuring 3.5 x 1.2 cm. Small 1 cm pedunculated osteochondroma of the posterior lateral tibial plateau. . Muscles and tendons: Intact without strain or tear . Subcutaneous tissues: Large soft tissue mass in the medial proximal lower leg measuring 11 by a by 5 cm with complex T2-weighted signal comprise the low, intermediate, and high signal elements and bruising areas of fibrosis. There is avid postcontrast  enhancement of this lesion. The lesion is may involve a portion of the pes anserinus tendons.. There is moderate surrounding edema tracking to the level of the mid lower leg. . Visualized joints: Intact  IMPRESSION:  Large infiltrative lesion in the medial lower leg most consistent with extra abdominal desmoid/fibromatosis. The edema surrounding this lesion extending into the mid lower leg  Posteromedial tibial exostosis  Small posterolateral tibial osteochondroma           Location(s) of Symptomatic tumor(s): Left lower calf of leg   Past/Anticipated chemotherapy by medical oncology, if any: No  Patient's main complaints related to symptomatic tumor(s) are: Swelling,pain  Pain on a scale of 0-10 is: 0/10 Taking Hydrocodone and Neurontin for pain out of for a month MS contin  If Spine Met(s), symptoms, if any, include:N/A  Bowel/Bladder retention or incontinence (please describe): Constipation taking Miralax three to four times a week, voiding without any problems  every two to three hours  Numbness or weakness in extremities  (please describe): Weakness and numbness to left leg;reports nerve damage from surgery to left leg.  Current Decadron regimen, if applicable: No  Ambulatory status? Walker? Wheelchair?: Ambulates with a cane.  SAFETY ISSUES:  Prior radiation?  01-14-15-02-20-15 Left posterior thigh  Pacemaker/ICD? No  Possible current pregnancy? No  Is the patient on methotrexate? No Saw Dr. Gwyndolyn Saxon Ward orthopedic surgeon in [place], [place] for a follow up appointment about a week and a half ago, will see again in November 2018.  Additional Complaints / other details:  51 y.o. married man with 3 daughters with recurrent desmoid fibromatosis. Wt Readings from Last 3 Encounters:  01/26/17 250 lb (113.4 kg)  12/29/16 249 lb (112.9 kg)  11/04/16 263 lb (119.3 kg)  BP 124/84   Pulse 79   Temp 97.6 F (36.4 C) (Oral)   Resp 20   Ht 6' 2"  (1.88 m)   Wt 250 lb (113.4 kg)   SpO2 99%   BMI 32.10 kg/m      .

## 2017-01-25 NOTE — Progress Notes (Signed)
Radiation Oncology         [phone] ________________________________  Name: Nathaniel Chambers        MRN: [id number]  Date of Service: 01/26/2017 DOB: 1966/01/16  KX:FGHWE, Butch Penny, MD  Ward, Julieta Bellini., MD     REFERRING PHYSICIAN: Ward, Julieta Bellini., MD   DIAGNOSIS: There were no encounter diagnoses.   HISTORY OF PRESENT ILLNESS: Nathaniel Chambers is a 51 y.o. male seen at the request of Dr. 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 The patient was found to have a large tumor in the left thigh in 2015. He was sent to Dr. Leonides Schanz in [place] from Dr. Drema Dallas at [facility]. AN MRI  At that time showed a n 11 x 25 cm mass medially and posteriorly to the femoral neck and into the hamstrings distally. On 06/30/13, he had radical resection measuring 19 x 12 cm and complex posterior buttocks mass measuring 10 x 15 cm of the thigh incision with the incision measuring 55 cm. The masses were discontinuous. The pathology revealed the desmoid tumor from the left posterior hip measuring 11.3 cm, and the superficial margin was .1 cm to the proximal margin, and the thigh mass was 18 cm. This involved the proximal and deep margins. He had persistent enhancement in a third separate area of nodular enhancement was present in the deep aspect of the semimembranosus muscle about 8 cm above the knee, and re-resection of the left posterior thigh measuring 6 x 15 cm was performed on 09/28/14. Postoperatively he was counseled on the fact that there was residual disease and subsequently he underwent adjuvant radiotherapy with Dr. Valere Dross.    He recently noted a mass in the left calf toward the end of 2017. He was started on Tamoxifen once the lesion was seen on MRI on 04/13/16 measuring 7.7 x 12.4 cm. Repeat MRI on 07/15/16 measured  lesion at 4.3 x 7.3 x 13.2 cm. He was discussed in [facility] conference, and discontinued Tamoxifen in April 2018. He was considering radiation  treatment at that time, and the discussion did not include intervention at that time. Repeat  MRI on 09/28/16 of the tibia and fibula on the left, and a 3.5 x 1.2 cm area of exostosis along the medial tibia was noted as well as a 1 cm osteochondroma of the posterior tibial plateau. He had an 11 x 5 cm mass in the medial proximal lower leg with bruising areas of fibrosis. Post-contrast enhancement was noted. He underwent resection of this mass on 11/25/16 revealing a 10.8 cm tumor consistent with desmoid fibromatosis, the tumor was .5 mm away from the anterior margin and 1 mm from the posterior margin.  Soft tissue in the deep margin, proximal margin, and bone margin were negative. He has not seen Dr. Alen Blew since March 2018, and    PREVIOUS RADIATION THERAPY: Yes   01/14/15-02/20/15: 56 Gy to the posterior left thigh and pelvis over 28 fractions    PAST MEDICAL HISTORY:  Past Medical History:  Diagnosis Date  . Asthma   . Cancer [facility])    Desmoid fibromatosis  . 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 Leonides Schanz  . Diverticulitis   . Hypertension   . Meralgia paraesthetica March 2015   Bilaterally  . Pneumonia    history of  . S/P radiation therapy 01/14/2015 through 02/20/2015    Left posterior thighs/inferior pelvis 5600 cGy in 28 sessions   . Sleep apnea   .  Torn meniscus 2018       PAST SURGICAL HISTORY: Past Surgical History:  Procedure Laterality Date  . HERNIA REPAIR  08/2008   rt inguinal  . LAPAROSCOPIC GASTRIC BAND REMOVAL WITH LAPAROSCOPIC GASTRIC SLEEVE RESECTION N/A 07/01/2016   Procedure: LAPAROSCOPIC GASTRIC BAND REMOVAL WITH LAPAROSCOPIC GASTRIC SLEEVE RESECTION;  Surgeon: Johnathan Hausen, MD;  Location: [facility];  Service: General;  Laterality: N/A;  . LAPAROSCOPIC GASTRIC BANDING  04-26-2007  . LAPAROSCOPIC SIGMOID COLECTOMY N/A 08/03/2014   Procedure: LAPAROSCOPIC TAKEDOWN INTRA-ABDOMINAL ABSCESS,  SIGMOID COLECTOMY WITH PRIMARY ANASTAMOSIS, APPENDECTOMY, MOBILIZATION OF SPLENIC FLEXURE;  Surgeon: Johnathan Hausen, MD;  Location: [facility];  Service: General;  Laterality: N/A;  . LEFT SHOULDER SURG  2000   rotator cuff  . Left Thigh Fibromatosis Resection  06/30/13  . LEG SURGERY     cancer  . REPAIR RECURRENT RIGHT INGUINAL HERNIA  10-22-2008  . SHOULDER ARTHROSCOPY  01/2009   rt   . SHOULDER ARTHROSCOPY  07/01/2011   Procedure: ARTHROSCOPY SHOULDER;  Surgeon: Magnus Sinning, MD;  Location: [facility];  Service: Orthopedics;  Laterality: Left;  WITH LABRAL DEBRIDEMENT  . SHOULDER OPEN ROTATOR CUFF REPAIR  07/01/2011   Procedure: ROTATOR CUFF REPAIR SHOULDER OPEN;  Surgeon: Magnus Sinning, MD;  Location: [place];  Service: Orthopedics;  Laterality: Left;  OPEN DISTAL CLAVICLE REPAIR  . SURG. FOR UNDESCENDED TESTICLES/ BILATERAL INGUINAL HERNIA REPAIR  AGE 81 MON OLD  . TUMOR REMOVAL    . vasectomy       FAMILY HISTORY:  Family History  Problem Relation Age of Onset  . Heart disease Father   . Cancer Mother        kidney, spleen     SOCIAL HISTORY:  reports that he quit smoking about 28 years ago. He has a 0.25 pack-year smoking history. He has never used smokeless tobacco. He reports that he does not drink alcohol or use drugs. The patient is married and lives in [place]. He is filing for disability, and has three daughters. He used to work in a [employer].   ALLERGIES: Penicillins; Solu-medrol [methylprednisolone acetate]; Dilaudid [hydromorphone hcl]; Iodinated diagnostic agents; Oxycodone; and Percocet [oxycodone-acetaminophen]   MEDICATIONS:  Current Outpatient Prescriptions  Medication Sig Dispense Refill  . albuterol (PROVENTIL HFA;VENTOLIN HFA) 108 (90 BASE) MCG/ACT inhaler Inhale 2 puffs into the lungs every 6 (six) hours as needed for wheezing or shortness of breath (wheezing).     . celecoxib (CELEBREX) 100 MG capsule  Take 1 capsule by mouth 2 (two) times daily.     . [employer] ASPIRIN EC 325 MG EC tablet Take 325 mg by mouth 2 (two) times daily.  0  . Fluticasone-Salmeterol (ADVAIR) 250-50 MCG/DOSE AEPB Inhale 1 puff into the lungs 2 (two) times daily.     Marland Kitchen gabapentin (NEURONTIN) 300 MG capsule Take 3 capsules (900 mg total) by mouth at bedtime. (Patient taking differently: Take 900 mg by mouth every evening. ) 270 capsule 5  . HYDROcodone-acetaminophen (HYCET) 7.5-325 mg/15 ml solution Take 15 mLs by mouth every 4 (four) hours as needed for moderate pain. 300 mL 0  . ondansetron (ZOFRAN) 4 MG tablet Take 1 tablet (4 mg total) by mouth every 8 (eight) hours as needed for nausea or vomiting. 30 tablet 2  . pantoprazole (PROTONIX) 20 MG tablet Take 1 tablet (20 mg total) by mouth daily. 60 tablet 0  . polyethylene glycol (MIRALAX / GLYCOLAX) packet Take 17 g by mouth  daily. (Patient taking differently: Take 17 g by mouth daily as needed for mild constipation. ) 14 each 0  . promethazine (PHENERGAN) 12.5 MG tablet Take 1 tablet (12.5 mg total) by mouth every 6 (six) hours as needed for nausea or vomiting. 30 tablet 0  . tiZANidine (ZANAFLEX) 4 MG tablet Take 4 mg by mouth 2 (two) times daily.  1  . Vitamin D, Ergocalciferol, (DRISDOL) 50000 UNITS CAPS capsule Take 50,000 Units by mouth every 7 (seven) days. Days of week vary.    Marland Kitchen zolpidem (AMBIEN) 10 MG tablet Take 10 mg by mouth at bedtime as needed for sleep.    Marland Kitchen ibuprofen (ADVIL,MOTRIN) 800 MG tablet Take 1 tablet (800 mg total) by mouth daily as needed. (Patient not taking: Reported on 06/22/2016) 90 tablet 0  . morphine (MS CONTIN) 15 MG 12 hr tablet Take 15 mg by mouth 2 (two) times daily.  0  . nortriptyline (PAMELOR) 10 MG capsule Start nortriptyline 10mg  at bedtime for 2 week, then increase to 2 tablet at bedtime (Patient not taking: Reported on 12/29/2016) 60 capsule 5  . tamoxifen (NOLVADEX) 20 MG tablet Take 1 tablet (20 mg total) by mouth daily. (Patient not  taking: Reported on 12/29/2016) 60 tablet 2   No current facility-administered medications for this encounter.      REVIEW OF SYSTEMS: On review of systems, the patient reports that he is doing well overall since his last surgery. He denies any chest pain, shortness of breath, cough, fevers, chills, night sweats, unintended weight changes. He denies any bowel or bladder disturbances, and denies abdominal pain, nausea or vomiting. He continues to have lower extremity edema and numbness in the medial calf at the lower aspect of the incision toward the medial malleolus and pitting edema of the LLE. He denies any new musculoskeletal or joint aches or pains. A complete review of systems is obtained and is otherwise negative.     PHYSICAL EXAM:  Wt Readings from Last 3 Encounters:  01/26/17 250 lb (113.4 kg)  12/29/16 249 lb (112.9 kg)  11/04/16 263 lb (119.3 kg)   Temp Readings from Last 3 Encounters:  01/26/17 97.6 F (36.4 C) (Oral)  12/29/16 98.4 F (36.9 C) (Oral)  07/10/16 97.9 F (36.6 C) (Oral)   BP Readings from Last 3 Encounters:  01/26/17 124/84  12/29/16 (!) 133/96  07/10/16 122/86   Pulse Readings from Last 3 Encounters:  01/26/17 79  12/29/16 (!) 56  07/10/16 (!) 103   Pain Assessment Pain Score: 0-No pain/10  In general this is a well appearing African American male in no acute distress. He is alert and oriented x4 and appropriate throughout the examination. HEENT reveals that the patient is normocephalic, atraumatic. EOMs are intact. PERRLA. Skin is intact without any evidence of gross lesions. Cardiovascular exam reveals a regular rate and rhythm, no clicks rubs or murmurs are auscultated. Chest is clear to auscultation bilaterally. Lymphatic assessment is performed and does not reveal any adenopathy in the cervical, supraclavicular, axillary, or inguinal chains. Abdomen has active bowel sounds in all quadrants and is intact. The abdomen is soft, non tender, non  distended. Right lower extremity is negative for pretibial pitting edema, deep calf tenderness, cyanosis or clubbing. The left lower extremity reveals pitting edema 1+, from the dorsal foot to the tibial tuberosity on the left.   ECOG = 1  0 - Asymptomatic (Fully active, able to carry on all predisease activities without restriction)  1 - Symptomatic but  completely ambulatory (Restricted in physically strenuous activity but ambulatory and able to carry out work of a light or sedentary nature. For example, light housework, office work)  2 - Symptomatic, <50% in bed during the day (Ambulatory and capable of all self care but unable to carry out any work activities. Up and about more than 50% of waking hours)  3 - Symptomatic, >50% in bed, but not bedbound (Capable of only limited self-care, confined to bed or chair 50% or more of waking hours)  4 - Bedbound (Completely disabled. Cannot carry on any self-care. Totally confined to bed or chair)  5 - Death   Eustace Pen MM, Creech RH, Tormey DC, et al. [phone]). "Toxicity and response criteria of the [facility]". [place] Oncol. 5 (6): 649-55    LABORATORY DATA:  Lab Results  Component Value Date   WBC 12.6 (H) 07/03/2016   HGB 13.7 07/03/2016   HCT 41.6 07/03/2016   MCV 87.0 07/03/2016   PLT 295 07/03/2016   Lab Results  Component Value Date   NA 138 06/26/2016   K 4.0 06/26/2016   CL 104 06/26/2016   CO2 26 06/26/2016   Lab Results  Component Value Date   ALT 38 06/16/2014   AST 38 (H) 06/16/2014   ALKPHOS 50 06/16/2014   BILITOT 0.4 06/16/2014      RADIOGRAPHY: No results found.     IMPRESSION/PLAN: 1. Recurrent Desmoid Fibromatosis of the LLE. The patient is counseled on the prior findings and after reviewing his case, Dr. Lisbeth Renshaw discusses the information regarding patients with the condition is limited, and that there are multiple approaches from a systemic perspective to consider. Dr. Lisbeth Renshaw  discusses that there are conflicting opinions regarding postop XRT, however in the setting of recurrent disease, we could consider treating the patient in this setting. He recommends meeting with Dr. Alen Blew to discuss systemic therapy and for Dr. Lisbeth Renshaw to discuss his case with Dr. Alen Blew and Dr. Leonides Schanz. Dr. Lisbeth Renshaw reviews the potential toxicities of additional radiotherapy to the same extremity that's previously been treated, and that even though this is a different location, the risks of lymphedema could be significant long term. His treatment in 2016 ended at the level of the left patella. The patient is in agreement and understands the risks associated. Dr. Lisbeth Renshaw would anticipate a course of 5 weeks of treatment to the LLE about the calf. Written consent is obtained and placed in the chart, a copy was provided to the patient. We will contact him to get set up for simulation and appreciate Dr. Hazeline Junker input regarding systemic options as well. 2. LLE edema, probable lymphedema. The patient will be referred for PT for evaluation and management.  The above documentation reflects my direct findings during this shared patient visit. Please see the separate note by Dr. Lisbeth Renshaw on this date for the remainder of the patient's plan of care.    Carola Rhine, PAC

## 2017-01-26 ENCOUNTER — Telehealth: Payer: Self-pay | Admitting: *Deleted

## 2017-01-26 ENCOUNTER — Encounter: Payer: Self-pay | Admitting: Radiation Oncology

## 2017-01-26 ENCOUNTER — Other Ambulatory Visit: Payer: Self-pay | Admitting: Radiation Oncology

## 2017-01-26 ENCOUNTER — Ambulatory Visit
Admission: RE | Admit: 2017-01-26 | Discharge: 2017-01-26 | Disposition: A | Discharge status: Home / Self Care | Payer: BLUE CROSS/BLUE SHIELD | Source: Ambulatory Visit

## 2017-01-26 ENCOUNTER — Ambulatory Visit
Admission: RE | Admit: 2017-01-26 | Discharge: 2017-01-26 | Disposition: A | Discharge status: Home / Self Care | Payer: BLUE CROSS/BLUE SHIELD | Source: Ambulatory Visit | Attending: Radiation Oncology | Admitting: Radiation Oncology

## 2017-01-26 VITALS — BP 124/84 | HR 79 | Temp 97.6°F | Resp 20 | Ht 74.0 in | Wt 250.0 lb

## 2017-01-26 DIAGNOSIS — Z9049 Acquired absence of other specified parts of digestive tract: Secondary | ICD-10-CM | POA: Insufficient documentation

## 2017-01-26 DIAGNOSIS — Z79899 Other long term (current) drug therapy: Secondary | ICD-10-CM | POA: Diagnosis not present

## 2017-01-26 DIAGNOSIS — Z923 Personal history of irradiation: Secondary | ICD-10-CM | POA: Diagnosis not present

## 2017-01-26 DIAGNOSIS — J449 Chronic obstructive pulmonary disease, unspecified: Secondary | ICD-10-CM | POA: Diagnosis not present

## 2017-01-26 DIAGNOSIS — R600 Localized edema: Secondary | ICD-10-CM | POA: Insufficient documentation

## 2017-01-26 DIAGNOSIS — Z888 Allergy status to other drugs, medicaments and biological substances status: Secondary | ICD-10-CM | POA: Diagnosis not present

## 2017-01-26 DIAGNOSIS — Z8249 Family history of ischemic heart disease and other diseases of the circulatory system: Secondary | ICD-10-CM | POA: Insufficient documentation

## 2017-01-26 DIAGNOSIS — G4730 Sleep apnea, unspecified: Secondary | ICD-10-CM | POA: Insufficient documentation

## 2017-01-26 DIAGNOSIS — Z7951 Long term (current) use of inhaled steroids: Secondary | ICD-10-CM | POA: Insufficient documentation

## 2017-01-26 DIAGNOSIS — Z8051 Family history of malignant neoplasm of kidney: Secondary | ICD-10-CM | POA: Insufficient documentation

## 2017-01-26 DIAGNOSIS — Z87891 Personal history of nicotine dependence: Secondary | ICD-10-CM | POA: Insufficient documentation

## 2017-01-26 DIAGNOSIS — Z91041 Radiographic dye allergy status: Secondary | ICD-10-CM | POA: Insufficient documentation

## 2017-01-26 DIAGNOSIS — D481 Neoplasm of uncertain behavior of connective and other soft tissue: Secondary | ICD-10-CM

## 2017-01-26 DIAGNOSIS — Z880 Allergy status to penicillin: Secondary | ICD-10-CM | POA: Insufficient documentation

## 2017-01-26 DIAGNOSIS — Z808 Family history of malignant neoplasm of other organs or systems: Secondary | ICD-10-CM | POA: Insufficient documentation

## 2017-01-26 DIAGNOSIS — I10 Essential (primary) hypertension: Secondary | ICD-10-CM | POA: Insufficient documentation

## 2017-01-26 DIAGNOSIS — Z9884 Bariatric surgery status: Secondary | ICD-10-CM | POA: Diagnosis not present

## 2017-01-26 DIAGNOSIS — Z7982 Long term (current) use of aspirin: Secondary | ICD-10-CM | POA: Diagnosis not present

## 2017-01-26 DIAGNOSIS — Z510 Encounter for antineoplastic radiation therapy: Secondary | ICD-10-CM | POA: Insufficient documentation

## 2017-01-26 NOTE — Telephone Encounter (Signed)
CALLED PATIENT TO INFORM OF PT APPT. FOR 01-28-17 - ARRIVAL TIME - 2:15 PM @ [facility], LVM FOR A RETURN CALL

## 2017-01-26 NOTE — Addendum Note (Signed)
Encounter addended by: Malena Edman, RN on: 01/26/2017  1:45 PM<BR>    Actions taken: Charge Capture section accepted

## 2017-01-27 ENCOUNTER — Ambulatory Visit
Admission: RE | Admit: 2017-01-27 | Discharge: 2017-01-27 | Disposition: A | Discharge status: Home / Self Care | Payer: Self-pay | Source: Ambulatory Visit | Attending: Radiation Oncology | Admitting: Radiation Oncology

## 2017-01-27 ENCOUNTER — Other Ambulatory Visit: Payer: Self-pay | Admitting: Radiation Oncology

## 2017-01-27 ENCOUNTER — Telehealth: Payer: Self-pay | Admitting: *Deleted

## 2017-01-27 ENCOUNTER — Inpatient Hospital Stay
Admission: RE | Admit: 2017-01-27 | Discharge: 2017-01-27 | Disposition: A | Discharge status: Home / Self Care | Payer: Self-pay | Source: Ambulatory Visit | Attending: Radiation Oncology | Admitting: Radiation Oncology

## 2017-01-27 ENCOUNTER — Encounter: Payer: Self-pay | Admitting: *Deleted

## 2017-01-27 ENCOUNTER — Telehealth: Payer: Self-pay

## 2017-01-27 DIAGNOSIS — C801 Malignant (primary) neoplasm, unspecified: Secondary | ICD-10-CM

## 2017-01-27 NOTE — Progress Notes (Signed)
[place] Work  Clinical Social Work was referred by radiation oncology for assessment of psychosocial needs.  Clinical Social Worker contacted patient by phone to offer support and assess for needs.  Mr. Borba shared he recently applied for Social Security disability, although he struggles with the idea of never working again.  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  Mr. Ratay shared the many stressors associated with his medical diagnosis including paying bills, finding purpose, and "staying busy".  CSW provided brief emotional support and validated patients feelings of stress and frustration.  CSW encouraged patient to follow up with CSW for counseling or additional resources.  Patient requested CSW send patient an email with follow up contact information.  CSW sent patient the following email:  Good morning Roddy, It was nice speaking with you.  As we discussed, nobody plans for health changes and managing all the stressors that come along with a diagnosis.  If you'd like to meet for a counseling session, common things we would review: providing space to process emotions, discussing strategies for managing stress, and exploring concepts related major life stressors.   While it seems most important now to focus on your disability process, if you do decide to go back to work, Cancer and Careers has some great information on getting back into the workforce. https://www.cancerandcareers.org/en  Also, while unable to work, many people explore new opportunities or hobbies that may connect them with others, provide opportunities to [employer], and even have a little fun! [employer] is an Armed forces training and education officer that provides creative wellness activities free for our patients, survivors, and caregivers.  I encourage you to check them out and maybe try  something new to you.    If you have any questions or would like to talk further, please give me a call or email!   Maryjean Morn, MSW, LCSW, OSW-C Clinical Social Worker [facility] [phone]

## 2017-01-27 NOTE — Telephone Encounter (Signed)
Spoke with Education officer, museum and she replied that patient has already have an appointment with her setup.

## 2017-01-27 NOTE — Telephone Encounter (Signed)
Left message for social worker regarding the high priority message that was sent on 10/3.

## 2017-01-28 ENCOUNTER — Telehealth: Payer: Self-pay | Admitting: Oncology

## 2017-01-28 ENCOUNTER — Ambulatory Visit
Admission: RE | Admit: 2017-01-28 | Discharge: 2017-01-28 | Disposition: A | Discharge status: Home / Self Care | Payer: BLUE CROSS/BLUE SHIELD | Source: Ambulatory Visit | Attending: Radiation Oncology | Admitting: Radiation Oncology

## 2017-01-28 ENCOUNTER — Ambulatory Visit: Payer: BLUE CROSS/BLUE SHIELD | Attending: Radiation Oncology | Admitting: Physical Therapy

## 2017-01-28 DIAGNOSIS — I890 Lymphedema, not elsewhere classified: Secondary | ICD-10-CM | POA: Diagnosis not present

## 2017-01-28 DIAGNOSIS — Z510 Encounter for antineoplastic radiation therapy: Secondary | ICD-10-CM | POA: Diagnosis not present

## 2017-01-28 DIAGNOSIS — R262 Difficulty in walking, not elsewhere classified: Secondary | ICD-10-CM | POA: Insufficient documentation

## 2017-01-28 DIAGNOSIS — D481 Neoplasm of uncertain behavior of connective and other soft tissue: Secondary | ICD-10-CM

## 2017-01-28 NOTE — Therapy (Signed)
[place], [place], [address] Phone: [phone]   Fax:  [phone]  Physical Therapy Evaluation  Patient Details  Name: Nathaniel Chambers MRN: [id number] Date of Birth: 1966-01-04 No Data Recorded  Encounter Date: 01/28/2017      PT End of Session - 01/28/17 1653    Visit Number 1   Number of Visits 1   PT Start Time 1430   PT Stop Time 1529   PT Time Calculation (min) 59 min   Activity Tolerance Patient tolerated treatment well   Behavior During Therapy [facility] for tasks assessed/performed      Past Medical History:  Diagnosis Date  . Asthma   . 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 Leonides Schanz  . Diverticulitis   . Hypertension   . Meralgia paraesthetica March 2015   Bilaterally  . Pneumonia    history of  . S/P radiation therapy 01/14/2015 through 02/20/2015    Left posterior thighs/inferior pelvis 5600 cGy in 28 sessions   . Sleep apnea   . Torn meniscus 2018    Past Surgical History:  Procedure Laterality Date  . HERNIA REPAIR  08/2008   rt inguinal  . LAPAROSCOPIC GASTRIC BAND REMOVAL WITH LAPAROSCOPIC GASTRIC SLEEVE RESECTION N/A 07/01/2016   Procedure: LAPAROSCOPIC GASTRIC BAND REMOVAL WITH LAPAROSCOPIC GASTRIC SLEEVE RESECTION;  Surgeon: Johnathan Hausen, MD;  Location: [facility];  Service: General;  Laterality: N/A;  . LAPAROSCOPIC GASTRIC BANDING  04-26-2007  . LAPAROSCOPIC SIGMOID COLECTOMY N/A 08/03/2014   Procedure: LAPAROSCOPIC TAKEDOWN INTRA-ABDOMINAL ABSCESS, SIGMOID COLECTOMY WITH PRIMARY ANASTAMOSIS, APPENDECTOMY, MOBILIZATION OF SPLENIC FLEXURE;  Surgeon: Johnathan Hausen, MD;  Location: [facility];  Service: General;  Laterality: N/A;  . LEFT SHOULDER SURG  2000   rotator cuff  . Left Thigh Fibromatosis Resection  06/30/13  . LEG SURGERY     cancer  . REPAIR RECURRENT  RIGHT INGUINAL HERNIA  10-22-2008  . SHOULDER ARTHROSCOPY  01/2009   rt   . SHOULDER ARTHROSCOPY  07/01/2011   Procedure: ARTHROSCOPY SHOULDER;  Surgeon: Magnus Sinning, MD;  Location: [facility];  Service: Orthopedics;  Laterality: Left;  WITH LABRAL DEBRIDEMENT  . SHOULDER OPEN ROTATOR CUFF REPAIR  07/01/2011   Procedure: ROTATOR CUFF REPAIR SHOULDER OPEN;  Surgeon: Magnus Sinning, MD;  Location: [place];  Service: Orthopedics;  Laterality: Left;  OPEN DISTAL CLAVICLE REPAIR  . SURG. FOR UNDESCENDED TESTICLES/ BILATERAL INGUINAL HERNIA REPAIR  AGE 90 MON OLD  . TUMOR REMOVAL    . vasectomy      There were no vitals filed for this visit.       Subjective Assessment - 01/28/17 1436    Subjective I was sent here.  I'm already taking physical therapy.  Because I have edema in my leg they eanted me to come to somebody who specializes in edema.  My edema isn't as bad as sometimes.  Is going to [facility] for strengthening, balance, and to keep it mobile.  Has numbness in left foot.  Is deliberately doing rehab at the same time as XRT in order to avoid build up of scar tissue.  Has a knee-high compression sock, but it causes the knee to swell up.  Has a Flexitouch and used it regularly until swelling seemed to go away.   Pertinent History Desmoid tumor left glut and posterior thigh resected 06/30/13 and 09/28/14.  Colon resection 08/03/14.  Recent resection of newer desmoid tumor in lower leg 11/25/16.  Had CT sim this morning for radiation, and says they're talking about chemo too.  He expects to have 5 weeks of weekday daily radiation.  Had right meniscus repair in April 2018, which helped, but it's still painful.  He may need partial knee replacement. Had lap band removed and replaced with gastric sleeve in March, and has lost 100 lbs. since then; wants to lose 36 lbs. more. Says he had a Doppler the day after Labor Day to check for DVT and it was negative.    Patient Stated Goals to get rid of this edema   Currently in Pain? Yes   Pain Score 7    Pain Location Leg  to ankle and foot   Pain Orientation Left   Pain Descriptors / Indicators Tightness;Numbness;Other (Comment)  stiff   Aggravating Factors  not having leg propped up so it swells more   Pain Relieving Factors propping it up; medication            [facility] PT Assessment - 01/28/17 0001      Assessment   Medical Diagnosis Left leg desmoid tumor resected     Balance Screen   Has the patient fallen in the past 6 months No  but some fear at times; says his drive is stronger than fear   Has the patient had a decrease in activity level because of a fear of falling?  No  but biggest fear is on stairs   Is the patient reluctant to leave their home because of a fear of falling?  No     Observation/Other Assessments   Observations wearing a copper fit brace over right knee; has marks on left leg from XRT sim   Skin Integrity approx. 14 inch scar at medial left leg from knee to mid-calf    Other Surveys  --  lymphedema life impact scale score of 37, or 54% impairment     Ambulation/Gait   Ambulation/Gait Yes   Ambulation/Gait Assistance 6: Modified independent (Device/Increase time)   Assistive device Straight cane   Gait Comments Mainly uses cane when he's out; at home he uses it at times, but can hold to the wall           LYMPHEDEMA/ONCOLOGY QUESTIONNAIRE - 01/28/17 1458      Right Lower Extremity Lymphedema   10 cm Proximal to Suprapatella (P)  48.5 cm   At Midpatella/Popliteal Crease (P)  38.5 cm   30 cm Proximal to Floor at Lateral Plantar Foot (P)  34 cm   20 cm Proximal to Floor at Lateral Plantar Foot (P)  26.1 1   10  cm Proximal to Floor at Lateral Malleoli (P)  26.3 cm   5 cm Proximal to 1st MTP Joint (P)  26.3 cm   Across MTP Joint (P)  25 cm   Around Proximal Great Toe (P)  8.8 cm   Other (P)  40 cm. proximal to lateral plantar surface of foot, 39.9   Other  (P)  measurements done in long sitting     Left Lower Extremity Lymphedema   10 cm Proximal to Suprapatella (P)  51.2 cm   At Midpatella/Popliteal Crease (P)  37.8 cm   30 cm Proximal to Floor at Lateral Plantar Foot (P)  35.5 cm   20 cm Proximal to Floor at Lateral Plantar Foot (P)  28.9 cm   10 cm Proximal to Floor at Lateral Malleoli (P)  29.1 cm   5 cm Proximal to 1st MTP Joint (P)  26.5 cm   Across MTP Joint (P)  24.4 cm   Around Proximal Great Toe (P)  8.9 cm   Other (P)  at 40 cm. proximal to lateral plantar surface of foot, 41.6         Objective measurements completed on examination: See above findings.                  PT Education - 01/28/17 1651    Education provided Yes   Education Details about self-care suggestions:  use Flexitouch once or twice a day, elevate leg, get a thigh high compression stocking and/or a Farrow wrap or Juxtafit for left LE compression.  For stocking, suggested [employer]is Exostrong off the shelf flat knit, 76-19 mmHg, with silicone band at the top; also advised about where he could get this.  Also talked a little about scar desensitization and mobilization techniques.   Person(s) Educated Patient   Methods Explanation;Handout   Comprehension Verbalized understanding                [place] - 01/28/17 1705      CC Long Term Goal  #4   Title Pt. will be knowledgeable about some compression stocking options, where and how to obtain one.   Time 1   Period Days   Status Achieved             Plan - 01/28/17 1653    Clinical Impression Statement Pleasant 52 year-old man s/p resection of desmoid tumor in left gluteal region and posterior thigh 06/30/13 with re-excision 09/28/14, followed by XRT which was completed 02/2015.  He had a recurrence in the left lower leg which was resected 11/25/16 and he will be starting a course of 25 XRT treatments for that soon.  He had developed leg swelling earlier and was seen in  this clinic about 18 months ago; he was given guidance about equipment and garments to obtain.  He reports having the Flexitouch pump and having purchased CopperFit compression knee highs, which helped the swelling. He feels that eventually the swelling resolved.  He has swelling again now, and finds that wearing the compression knee high causes fluid to back up at and above the knee.  Because he has some equipment already to use to manage swelling, today he was given advice about using the pump once or twice a day. He was also advised to get a thigh high compression garment, preferably a flat knit garment; he was also shown Farrow wrap and Juxtafit options.  He was interested in these velcro garments because of their flexiibility.  He is getting PT elsewhere for knee pain and decreased strength and came here just about swelling today.   History and Personal Factors relevant to plan of care: obesity treated with lap band, then gastric sleeve surgery with 100 lb. weight loss in the last 6 months; knee pain with h/o menisectomy on right; gait impairment requiring use of can   Clinical Presentation Evolving   Clinical Presentation due to: still recovering from recurrent desmoid tumor resection two months ago and will start radiation treatment very soon   Clinical Decision Making Moderate   Rehab Potential Good   PT Frequency One time visit   PT Treatment/Interventions ADLs/Self Care Home Management;Patient/family education;DME Instruction   PT Next Visit Plan No follow-up planned, though patient may call if questions arise for him or schedule an appointment if  he needs assistance with getting measured for garment(s).   PT Home Exercise Plan use Flexitouch pump once or twice a day for leg swelling   Consulted and Agree with Plan of Care Patient      Patient will benefit from skilled therapeutic intervention in order to improve the following deficits and impairments:  Abnormal gait, Increased edema, Pain,  Decreased knowledge of use of DME  Visit Diagnosis: Lymphedema, not elsewhere classified - Plan: PT plan of care cert/re-cert  Difficulty in walking, not elsewhere classified - Plan: PT plan of care cert/re-cert     Problem List Patient Active Problem List   Diagnosis Date Noted  . 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 Desmoid fibromatosis 12/13/2014  . S/P partial colectomy 08/03/2014  . Meralgia paraesthetica 07/12/2014  . Diverticulitis of large intestine without perforation or abscess without bleeding   . Enteritis due to Clostridium difficile   . Obesity 04/10/2014  . Colonic diverticular abscess 04/04/2014  . Sigmoid diverticulitis 04/04/2014  . Elevated lipase 12/19/2013  . Diverticulitis 12/15/2013  . Asthma 12/15/2013  . Fatty liver 12/15/2013    Lyndall Windt 01/28/2017, 5:11 PM  [place], [place], [address] Phone: [phone]   Fax:  [phone]  Name: Nathaniel Chambers MRN: [id number] Date of Birth: 06-Oct-1965  Serafina Royals, PT 01/28/17 5:11 PM

## 2017-01-28 NOTE — Telephone Encounter (Signed)
Spoke with patient regarding appts that were scheduled per 10/3 sch msg.

## 2017-02-02 DIAGNOSIS — Z510 Encounter for antineoplastic radiation therapy: Secondary | ICD-10-CM | POA: Diagnosis not present

## 2017-02-03 ENCOUNTER — Encounter: Payer: Self-pay | Admitting: *Deleted

## 2017-02-03 NOTE — Progress Notes (Signed)
On 02-03-17 got fmla paper from [employer], gave to the nurse.

## 2017-02-04 ENCOUNTER — Ambulatory Visit: Payer: BLUE CROSS/BLUE SHIELD

## 2017-02-04 ENCOUNTER — Ambulatory Visit: Payer: BLUE CROSS/BLUE SHIELD | Admitting: Radiation Oncology

## 2017-02-08 ENCOUNTER — Ambulatory Visit
Admission: RE | Admit: 2017-02-08 | Discharge: 2017-02-08 | Disposition: A | Discharge status: Home / Self Care | Payer: BLUE CROSS/BLUE SHIELD | Source: Ambulatory Visit | Attending: Radiation Oncology | Admitting: Radiation Oncology

## 2017-02-08 DIAGNOSIS — Z510 Encounter for antineoplastic radiation therapy: Secondary | ICD-10-CM | POA: Diagnosis not present

## 2017-02-08 DIAGNOSIS — D481 Neoplasm of uncertain behavior of connective and other soft tissue: Secondary | ICD-10-CM

## 2017-02-08 MED ORDER — BIAFINE EX EMUL
Freq: Every day | CUTANEOUS | Status: DC
  Administered 2017-02-08: 11:00:00 via TOPICAL

## 2017-02-08 NOTE — Progress Notes (Signed)
Patient education reviewed, skin  irritation to LLE and fatigue, patient given biafine to apply after rad daily, nothing 4 hours prior to treatment, get plenty rest,sleep, stay hydrated, drink plenty water, increase protein in diet, sees MD weekly and prn, teach back given 11:08 AM

## 2017-02-09 ENCOUNTER — Encounter: Payer: Self-pay | Admitting: Radiation Oncology

## 2017-02-09 ENCOUNTER — Ambulatory Visit
Admission: RE | Admit: 2017-02-09 | Discharge: 2017-02-09 | Disposition: A | Discharge status: Home / Self Care | Payer: BLUE CROSS/BLUE SHIELD | Source: Ambulatory Visit | Attending: Radiation Oncology | Admitting: Radiation Oncology

## 2017-02-09 DIAGNOSIS — Z510 Encounter for antineoplastic radiation therapy: Secondary | ICD-10-CM | POA: Diagnosis not present

## 2017-02-09 NOTE — Progress Notes (Signed)
I have faxed [employer] medical provider form on 02/09/2017.

## 2017-02-10 ENCOUNTER — Ambulatory Visit
Admission: RE | Admit: 2017-02-10 | Discharge: 2017-02-10 | Disposition: A | Discharge status: Home / Self Care | Payer: BLUE CROSS/BLUE SHIELD | Source: Ambulatory Visit | Attending: Radiation Oncology | Admitting: Radiation Oncology

## 2017-02-10 DIAGNOSIS — Z510 Encounter for antineoplastic radiation therapy: Secondary | ICD-10-CM | POA: Diagnosis not present

## 2017-02-11 ENCOUNTER — Ambulatory Visit
Admission: RE | Admit: 2017-02-11 | Discharge: 2017-02-11 | Disposition: A | Discharge status: Home / Self Care | Payer: BLUE CROSS/BLUE SHIELD | Source: Ambulatory Visit | Attending: Radiation Oncology | Admitting: Radiation Oncology

## 2017-02-11 DIAGNOSIS — Z510 Encounter for antineoplastic radiation therapy: Secondary | ICD-10-CM | POA: Diagnosis not present

## 2017-02-12 ENCOUNTER — Ambulatory Visit
Admission: RE | Admit: 2017-02-12 | Discharge: 2017-02-12 | Disposition: A | Discharge status: Home / Self Care | Payer: BLUE CROSS/BLUE SHIELD | Source: Ambulatory Visit | Attending: Radiation Oncology | Admitting: Radiation Oncology

## 2017-02-12 DIAGNOSIS — Z510 Encounter for antineoplastic radiation therapy: Secondary | ICD-10-CM | POA: Diagnosis not present

## 2017-02-15 ENCOUNTER — Ambulatory Visit
Admission: RE | Admit: 2017-02-15 | Discharge: 2017-02-15 | Disposition: A | Discharge status: Home / Self Care | Payer: BLUE CROSS/BLUE SHIELD | Source: Ambulatory Visit | Attending: Radiation Oncology | Admitting: Radiation Oncology

## 2017-02-15 DIAGNOSIS — Z510 Encounter for antineoplastic radiation therapy: Secondary | ICD-10-CM | POA: Diagnosis not present

## 2017-02-16 ENCOUNTER — Ambulatory Visit
Admission: RE | Admit: 2017-02-16 | Discharge: 2017-02-16 | Disposition: A | Discharge status: Home / Self Care | Payer: BLUE CROSS/BLUE SHIELD | Source: Ambulatory Visit | Attending: Radiation Oncology | Admitting: Radiation Oncology

## 2017-02-16 DIAGNOSIS — Z510 Encounter for antineoplastic radiation therapy: Secondary | ICD-10-CM | POA: Diagnosis not present

## 2017-02-17 ENCOUNTER — Telehealth: Payer: Self-pay | Admitting: Oncology

## 2017-02-17 ENCOUNTER — Ambulatory Visit
Admission: RE | Admit: 2017-02-17 | Discharge: 2017-02-17 | Disposition: A | Discharge status: Home / Self Care | Payer: BLUE CROSS/BLUE SHIELD | Source: Ambulatory Visit | Attending: Radiation Oncology | Admitting: Radiation Oncology

## 2017-02-17 ENCOUNTER — Ambulatory Visit (HOSPITAL_BASED_OUTPATIENT_CLINIC_OR_DEPARTMENT_OTHER): Payer: BLUE CROSS/BLUE SHIELD | Admitting: Oncology

## 2017-02-17 VITALS — BP 125/86 | HR 69 | Temp 97.8°F | Resp 20 | Ht 74.0 in | Wt 249.0 lb

## 2017-02-17 DIAGNOSIS — D481 Neoplasm of uncertain behavior of connective and other soft tissue: Secondary | ICD-10-CM | POA: Diagnosis not present

## 2017-02-17 DIAGNOSIS — R200 Anesthesia of skin: Secondary | ICD-10-CM | POA: Diagnosis not present

## 2017-02-17 DIAGNOSIS — G8929 Other chronic pain: Secondary | ICD-10-CM | POA: Diagnosis not present

## 2017-02-17 DIAGNOSIS — Z510 Encounter for antineoplastic radiation therapy: Secondary | ICD-10-CM | POA: Diagnosis not present

## 2017-02-17 NOTE — Progress Notes (Signed)
Hematology and Oncology Follow Up Visit  Nathaniel Chambers [id number] [birth year] 51 y.o. 02/17/2017 3:08 PM Darcus Austin, MDGates, Butch Penny, MD   Principle Diagnosis: 51 year old gentleman with left lower extremity desmoid fibromatosis diagnosed in 2015. He presented with pain and MRI showed 2 separate masses involving the left thigh.   Prior Therapy: He underwent resection initially 2015 under the care of Dr. Leonides Schanz with positive margins. He underwent a reexcision in May 2016 with evidence of recurrence at that time. He received adjuvant external beam radiation between September and November 2016. He has been on active surveillance since that time    Current therapy: Radiation therapy postoperatively to his left lower extremity  Interim History: Mr. Trettel presents today for a follow-up visit. Since his last visit,  he underwent a desmoid tumor resection performed by Dr. Leonides Schanz at [facility] in August 2018. He tolerated the procedure well without any major complications. He does report some numbness and that leg with decreased sensation on the lateral and medial aspect of his leg. He still ambulating without any difficulties and off started radiation therapy postoperatively. He continues to lose weight intentionally after his weight reduction surgery. He denied any recent recurrence of his desmoid tumor.  He is not report any constitutional symptoms of fevers or chills or sweats. He does not report any headaches, blurry vision, syncope or seizures. He does not report any fevers or chills or sweats. He does not report any cough, wheezing or hemoptysis. He does not report any nausea, vomiting or abdominal pain. He does not report any frequency urgency or hesitancy. Remainder review of systems unremarkable.   Medications: I have reviewed the patient's current medications.  Current Outpatient Prescriptions  Medication Sig Dispense Refill  . albuterol (PROVENTIL HFA;VENTOLIN HFA) 108  (90 BASE) MCG/ACT inhaler Inhale 2 puffs into the lungs every 6 (six) hours as needed for wheezing or shortness of breath (wheezing).     Marland Kitchen emollient (BIAFINE) cream Apply topically daily. LLE apply biafine after rad daily    . Fluticasone-Salmeterol (ADVAIR) 250-50 MCG/DOSE AEPB Inhale 1 puff into the lungs 2 (two) times daily.     Marland Kitchen gabapentin (NEURONTIN) 300 MG capsule Take 3 capsules (900 mg total) by mouth at bedtime. (Patient taking differently: Take 900 mg by mouth every evening. ) 270 capsule 5  . HYDROcodone-acetaminophen (HYCET) 7.5-325 mg/15 ml solution Take 15 mLs by mouth every 4 (four) hours as needed for moderate pain. 300 mL 0  . ibuprofen (ADVIL,MOTRIN) 800 MG tablet Take 1 tablet (800 mg total) by mouth daily as needed. (Patient not taking: Reported on 06/22/2016) 90 tablet 0  . nortriptyline (PAMELOR) 10 MG capsule Start nortriptyline 10mg  at bedtime for 2 week, then increase to 2 tablet at bedtime 60 capsule 5  . ondansetron (ZOFRAN) 4 MG tablet Take 1 tablet (4 mg total) by mouth every 8 (eight) hours as needed for nausea or vomiting. 30 tablet 2  . pantoprazole (PROTONIX) 20 MG tablet Take 1 tablet (20 mg total) by mouth daily. 60 tablet 0  . polyethylene glycol (MIRALAX / GLYCOLAX) packet Take 17 g by mouth daily. (Patient taking differently: Take 17 g by mouth daily as needed for mild constipation. ) 14 each 0  . promethazine (PHENERGAN) 12.5 MG tablet Take 1 tablet (12.5 mg total) by mouth every 6 (six) hours as needed for nausea or vomiting. 30 tablet 0  . tamoxifen (NOLVADEX) 20 MG tablet Take 1 tablet (20 mg total) by mouth daily. (  Patient not taking: Reported on 12/29/2016) 60 tablet 2  . tiZANidine (ZANAFLEX) 4 MG tablet Take 4 mg by mouth 2 (two) times daily.  1  . Vitamin D, Ergocalciferol, (DRISDOL) 50000 UNITS CAPS capsule Take 50,000 Units by mouth every 7 (seven) days. Days of week vary.    Marland Kitchen zolpidem (AMBIEN) 10 MG tablet Take 10 mg by mouth at bedtime as needed for  sleep.     No current facility-administered medications for this visit.      Allergies:  Allergies  Allergen Reactions  . Penicillins Shortness Of Breath and Swelling    Has patient had a PCN reaction causing immediate rash, facial/tongue/throat swelling, SOB or lightheadedness with hypotension: Yes Has patient had a PCN reaction causing severe rash involving mucus membranes or skin necrosis: No Has patient had a PCN reaction that required hospitalization No Has patient had a PCN reaction occurring within the last 10 years: No If all of the above answers are "NO", then may proceed with Cephalosporin use.  Swelling of uvula; tolerates augmentin  . Solu-Medrol [Methylprednisolone Acetate] Anaphylaxis  . Dilaudid [Hydromorphone Hcl] Nausea And Vomiting  . Iodinated Diagnostic Agents Nausea And Vomiting    Contrast agent with MRI, Bone scan  . Oxycodone Nausea And Vomiting    Takes phenergan to alleviate symptoms  . Percocet [Oxycodone-Acetaminophen] Nausea And Vomiting    Past Medical History, Surgical history, Social history, and Family History were reviewed and updated.  Physical Exam: Blood pressure 125/86, pulse 69, temperature 97.8 F (36.6 C), temperature source Oral, resp. rate 20, height 6\' 2"  (1.88 m), weight 249 lb (112.9 kg), SpO2 99 %. ECOG: 0 General appearance: well-appearing gentleman without distress. Head: Normocephalic, without obvious abnormality. o oral thrush or ulcers. Neck: no adenopathy Lymph nodes: Cervical, supraclavicular, and axillary nodes normal. Heart:regular rate and rhythm, S1, S2 normal, no murmur, click, rub or gallop Lung:chest clear, no wheezing, rales, normal symmetric air entry Abdomin: soft, non-tender, without masses or organomegaly EXT: left lower extremity edema compared to the right.   Lab Results: Lab Results  Component Value Date   WBC 12.6 (H) 07/03/2016   HGB 13.7 07/03/2016   HCT 41.6 07/03/2016   MCV 87.0 07/03/2016   PLT  295 07/03/2016     Chemistry      Component Value Date/Time   NA 138 06/26/2016 0845   K 4.0 06/26/2016 0845   CL 104 06/26/2016 0845   CO2 26 06/26/2016 0845   BUN 13 06/26/2016 0845   CREATININE 1.30 (H) 07/15/2016 1629      Component Value Date/Time   CALCIUM 9.5 06/26/2016 0845   ALKPHOS 50 06/16/2014 0500   AST 38 (H) 06/16/2014 0500   ALT 38 06/16/2014 0500   BILITOT 0.4 06/16/2014 0500      I Impression and Plan:   51 year old gentleman with the following issues:  1. Left leg desmoid fibromatosis diagnosed in 2015. He is status post resection on multiple occasions most recent of which in 2016 followed by adjuvant radiation therapy.   He developed recent relapse of his desmoid tumor and was resected by Dr. Leonides Schanz in August 2018. He is receiving adjuvant radiation therapy at this time.  The role for systemic therapy was discussed today and is very limited at this time. Unless he develops recurrent disease that is inoperable then I would not recommend any further systemic therapy.  2. Chronic pain: Pain is manageable at this time. He has pain predominantly in the left knee as well  as the right knee.  3. Follow-up: will be in 3 months and sooner if needed to.  Zola Button, MD 10/24/20183:08 PM

## 2017-02-17 NOTE — Telephone Encounter (Signed)
Gave avs and calendar for January 2019 °

## 2017-02-18 ENCOUNTER — Ambulatory Visit
Admission: RE | Admit: 2017-02-18 | Discharge: 2017-02-18 | Disposition: A | Discharge status: Home / Self Care | Payer: BLUE CROSS/BLUE SHIELD | Source: Ambulatory Visit | Attending: Radiation Oncology | Admitting: Radiation Oncology

## 2017-02-18 DIAGNOSIS — Z510 Encounter for antineoplastic radiation therapy: Secondary | ICD-10-CM | POA: Diagnosis not present

## 2017-02-19 ENCOUNTER — Ambulatory Visit
Admission: RE | Admit: 2017-02-19 | Discharge: 2017-02-19 | Disposition: A | Discharge status: Home / Self Care | Payer: BLUE CROSS/BLUE SHIELD | Source: Ambulatory Visit | Attending: Radiation Oncology | Admitting: Radiation Oncology

## 2017-02-19 DIAGNOSIS — Z510 Encounter for antineoplastic radiation therapy: Secondary | ICD-10-CM | POA: Diagnosis not present

## 2017-02-22 ENCOUNTER — Ambulatory Visit
Admission: RE | Admit: 2017-02-22 | Discharge: 2017-02-22 | Disposition: A | Discharge status: Home / Self Care | Payer: BLUE CROSS/BLUE SHIELD | Source: Ambulatory Visit | Attending: Radiation Oncology | Admitting: Radiation Oncology

## 2017-02-22 DIAGNOSIS — Z510 Encounter for antineoplastic radiation therapy: Secondary | ICD-10-CM | POA: Diagnosis not present

## 2017-02-23 ENCOUNTER — Ambulatory Visit
Admission: RE | Admit: 2017-02-23 | Discharge: 2017-02-23 | Disposition: A | Discharge status: Home / Self Care | Payer: BLUE CROSS/BLUE SHIELD | Source: Ambulatory Visit | Attending: Radiation Oncology | Admitting: Radiation Oncology

## 2017-02-23 DIAGNOSIS — Z510 Encounter for antineoplastic radiation therapy: Secondary | ICD-10-CM | POA: Diagnosis not present

## 2017-02-24 ENCOUNTER — Ambulatory Visit
Admission: RE | Admit: 2017-02-24 | Discharge: 2017-02-24 | Disposition: A | Discharge status: Home / Self Care | Payer: BLUE CROSS/BLUE SHIELD | Source: Ambulatory Visit | Attending: Radiation Oncology | Admitting: Radiation Oncology

## 2017-02-24 DIAGNOSIS — Z510 Encounter for antineoplastic radiation therapy: Secondary | ICD-10-CM | POA: Diagnosis not present

## 2017-02-25 ENCOUNTER — Ambulatory Visit
Admission: RE | Admit: 2017-02-25 | Discharge: 2017-02-25 | Disposition: A | Discharge status: Home / Self Care | Payer: BLUE CROSS/BLUE SHIELD | Source: Ambulatory Visit | Attending: Radiation Oncology | Admitting: Radiation Oncology

## 2017-02-25 DIAGNOSIS — Z510 Encounter for antineoplastic radiation therapy: Secondary | ICD-10-CM | POA: Diagnosis not present

## 2017-02-26 ENCOUNTER — Ambulatory Visit
Admission: RE | Admit: 2017-02-26 | Discharge: 2017-02-26 | Disposition: A | Discharge status: Home / Self Care | Payer: BLUE CROSS/BLUE SHIELD | Source: Ambulatory Visit | Attending: Radiation Oncology | Admitting: Radiation Oncology

## 2017-02-26 DIAGNOSIS — Z510 Encounter for antineoplastic radiation therapy: Secondary | ICD-10-CM | POA: Diagnosis not present

## 2017-02-26 MED ORDER — SONAFINE EX EMUL: 1.0000 "application " | Freq: Two times a day (BID) | CUTANEOUS | Status: DC

## 2017-03-01 ENCOUNTER — Ambulatory Visit
Admission: RE | Admit: 2017-03-01 | Discharge: 2017-03-01 | Disposition: A | Discharge status: Home / Self Care | Payer: BLUE CROSS/BLUE SHIELD | Source: Ambulatory Visit | Attending: Radiation Oncology | Admitting: Radiation Oncology

## 2017-03-01 DIAGNOSIS — Z510 Encounter for antineoplastic radiation therapy: Secondary | ICD-10-CM | POA: Diagnosis not present

## 2017-03-02 ENCOUNTER — Ambulatory Visit
Admission: RE | Admit: 2017-03-02 | Discharge: 2017-03-02 | Disposition: A | Discharge status: Home / Self Care | Payer: BLUE CROSS/BLUE SHIELD | Source: Ambulatory Visit | Attending: Radiation Oncology | Admitting: Radiation Oncology

## 2017-03-02 DIAGNOSIS — Z510 Encounter for antineoplastic radiation therapy: Secondary | ICD-10-CM | POA: Diagnosis not present

## 2017-03-03 ENCOUNTER — Ambulatory Visit
Admission: RE | Admit: 2017-03-03 | Discharge: 2017-03-03 | Disposition: A | Discharge status: Home / Self Care | Payer: BLUE CROSS/BLUE SHIELD | Source: Ambulatory Visit | Attending: Radiation Oncology | Admitting: Radiation Oncology

## 2017-03-03 DIAGNOSIS — Z510 Encounter for antineoplastic radiation therapy: Secondary | ICD-10-CM | POA: Diagnosis not present

## 2017-03-04 ENCOUNTER — Ambulatory Visit
Admission: RE | Admit: 2017-03-04 | Discharge: 2017-03-04 | Disposition: A | Discharge status: Home / Self Care | Payer: BLUE CROSS/BLUE SHIELD | Source: Ambulatory Visit | Attending: Radiation Oncology | Admitting: Radiation Oncology

## 2017-03-04 DIAGNOSIS — D481 Neoplasm of uncertain behavior of connective and other soft tissue: Secondary | ICD-10-CM

## 2017-03-04 DIAGNOSIS — Z510 Encounter for antineoplastic radiation therapy: Secondary | ICD-10-CM | POA: Diagnosis not present

## 2017-03-05 ENCOUNTER — Ambulatory Visit
Admission: RE | Admit: 2017-03-05 | Discharge: 2017-03-05 | Disposition: A | Discharge status: Home / Self Care | Payer: BLUE CROSS/BLUE SHIELD | Source: Ambulatory Visit | Attending: Radiation Oncology | Admitting: Radiation Oncology

## 2017-03-05 DIAGNOSIS — Z510 Encounter for antineoplastic radiation therapy: Secondary | ICD-10-CM | POA: Diagnosis not present

## 2017-03-05 MED ORDER — HYDROCODONE-ACETAMINOPHEN 7.5-325 MG/15ML PO SOLN: 15.0000 mL | ORAL | 0 refills | Status: AC | PRN

## 2017-03-08 ENCOUNTER — Ambulatory Visit
Admission: RE | Admit: 2017-03-08 | Discharge: 2017-03-08 | Disposition: A | Discharge status: Home / Self Care | Payer: BLUE CROSS/BLUE SHIELD | Source: Ambulatory Visit | Attending: Radiation Oncology | Admitting: Radiation Oncology

## 2017-03-08 DIAGNOSIS — Z510 Encounter for antineoplastic radiation therapy: Secondary | ICD-10-CM | POA: Diagnosis not present

## 2017-03-09 ENCOUNTER — Ambulatory Visit: Payer: BLUE CROSS/BLUE SHIELD

## 2017-03-09 ENCOUNTER — Ambulatory Visit
Admission: RE | Admit: 2017-03-09 | Discharge: 2017-03-09 | Disposition: A | Discharge status: Home / Self Care | Payer: BLUE CROSS/BLUE SHIELD | Source: Ambulatory Visit | Attending: Radiation Oncology | Admitting: Radiation Oncology

## 2017-03-09 DIAGNOSIS — Z510 Encounter for antineoplastic radiation therapy: Secondary | ICD-10-CM | POA: Diagnosis not present

## 2017-03-10 ENCOUNTER — Ambulatory Visit: Payer: BLUE CROSS/BLUE SHIELD

## 2017-03-10 NOTE — Progress Notes (Signed)
  Radiation Oncology         [phone] ________________________________  Name: Nathaniel Chambers MRN: [id number]  Date: 01/28/2017  DOB: Oct 20, 1965  SIMULATION AND TREATMENT PLANNING NOTE  DIAGNOSIS:     ICD-10-CM   1. Desmoid fibromatosis D48.1      Site:  Left lower extremity  NARRATIVE:  The patient was brought to the [employer].  Identity was confirmed.  All relevant records and images related to the planned course of therapy were reviewed.   Written consent to proceed with treatment was confirmed which was freely given after reviewing the details related to the planned course of therapy had been reviewed with the patient.  Then, the patient was set-up in a stable reproducible  supine position for radiation therapy.  CT images were obtained.  Surface markings were placed.    Medically necessary complex treatment device(s) for immobilization: Customized Vac-Lok bag.   The CT images were loaded into the planning software.  Then the target and avoidance structures were contoured.  Treatment planning then occurred.  The radiation prescription was entered and confirmed.  I have requested : 3D Simulation  I have requested a DVH of the following structures: Target volume, femoral joint, skin in the previous treatment area.   The patient will undergo daily image guidance to ensure accurate localization of the target, and adequate minimize dose to the normal surrounding structures in close proximity to the target.   PLAN:  The patient will receive 50 Gy in 25 fractions.    Special treatment procedure The patient has received prior radiation treatment to the left leg.  There are overlapping areas of concern and this represents a course of re-radiation to a portion of the treatment in the superior aspect of the current area of concern.  Extra work will be performed to account for this prior treatment.  This therefore represents a special treatment  procedure.  ________________________________   Jodelle Gross, MD, PhD

## 2017-03-11 ENCOUNTER — Ambulatory Visit: Payer: BLUE CROSS/BLUE SHIELD

## 2017-03-12 ENCOUNTER — Ambulatory Visit: Payer: BLUE CROSS/BLUE SHIELD

## 2017-03-15 ENCOUNTER — Ambulatory Visit
Admission: RE | Admit: 2017-03-15 | Discharge: 2017-03-15 | Disposition: A | Discharge status: Home / Self Care | Payer: BLUE CROSS/BLUE SHIELD | Source: Ambulatory Visit | Attending: Radiation Oncology | Admitting: Radiation Oncology

## 2017-03-15 ENCOUNTER — Encounter: Payer: Self-pay | Admitting: Radiation Oncology

## 2017-03-15 DIAGNOSIS — Z510 Encounter for antineoplastic radiation therapy: Secondary | ICD-10-CM | POA: Diagnosis not present

## 2017-03-16 ENCOUNTER — Ambulatory Visit: Admission: RE | Admit: 2017-03-16 | Payer: BLUE CROSS/BLUE SHIELD | Source: Ambulatory Visit

## 2017-03-16 NOTE — Progress Notes (Signed)
  Radiation Oncology         [phone] ________________________________  Name: Nathaniel Chambers MRN: [id number]  Date: 03/15/2017  DOB: April 15, 1966  End of Treatment Note  Diagnosis:   Recurrent Desmoid Fibromatosis of the LLE.       ICD-10-CM   1. Desmoid fibromatosis D48.1     Indication for treatment:  curative       Radiation treatment dates:   02/08/2017 to 03/15/2017  Site/dose:   The external left leg was treated to 50 Gy in 25 fractions at 2 Gy per fraction.  Beams/energy:   3D // 6X  Narrative: The patient tolerated radiation treatment relatively well.   She experienced left leg swelling, weakness, numbness, tingling, and pain. She also reported moderate fatigue with treatment.  Plan: The patient has completed radiation treatment. The patient will return to radiation oncology clinic for routine followup in one month. I advised them to call or return sooner if they have any questions or concerns related to their recovery or treatment.  ------------------------------------------------  Jodelle Gross, MD, PhD  This document serves as a record of services personally performed by Kyung Rudd, MD. It was created on his behalf by Arlyce Harman, a trained medical scribe. The creation of this record is based on the scribe's personal observations and the provider's statements to them. This document has been checked and approved by the attending provider.

## 2017-03-17 ENCOUNTER — Ambulatory Visit: Payer: BLUE CROSS/BLUE SHIELD

## 2017-04-19 ENCOUNTER — Ambulatory Visit
Admission: RE | Admit: 2017-04-19 | Payer: BLUE CROSS/BLUE SHIELD | Source: Ambulatory Visit | Admitting: Radiation Oncology

## 2017-05-03 ENCOUNTER — Encounter (HOSPITAL_COMMUNITY): Payer: Self-pay

## 2017-05-20 ENCOUNTER — Ambulatory Visit: Payer: Self-pay | Admitting: Oncology

## 2017-06-29 ENCOUNTER — Encounter: Payer: Self-pay | Admitting: Skilled Nursing Facility1

## 2017-06-29 ENCOUNTER — Ambulatory Visit: Payer: Self-pay | Admitting: Skilled Nursing Facility1

## 2017-06-29 ENCOUNTER — Encounter: Payer: Self-pay | Attending: Surgery | Admitting: Skilled Nursing Facility1

## 2017-06-29 DIAGNOSIS — Z9884 Bariatric surgery status: Secondary | ICD-10-CM | POA: Insufficient documentation

## 2017-06-29 DIAGNOSIS — Z6833 Body mass index (BMI) 33.0-33.9, adult: Secondary | ICD-10-CM | POA: Insufficient documentation

## 2017-06-29 DIAGNOSIS — E669 Obesity, unspecified: Secondary | ICD-10-CM | POA: Insufficient documentation

## 2017-06-29 DIAGNOSIS — Z713 Dietary counseling and surveillance: Secondary | ICD-10-CM | POA: Insufficient documentation

## 2017-06-29 NOTE — Progress Notes (Signed)
Sleeve Gastrectomy Surgery  Medical Nutrition Therapy:  Appt start time: 9:58 end time:  10:30  Primary concerns today: Post-operative Bariatric Surgery Nutrition Management. Pt states his goal is To be 210 pounds.  Pt arrives being upset his weight is not where he wants it to be. Pt states he battles with his medications causing weight gain. Pt admits to eating some fried foods. Pt states he got down to 243 pounds and is now 247 pounds. Pt states he is not working. 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 Pt states he is not sleeping well at night due to severe pain. Pt states he wakes due to the pain and eats with his pain medicine. Pt states he is still having a few bowel movements a day. Pt is struggling with his foods and sees he is heading off his path. Pt states he weighs himself every day.    Goal: from 346 pounds to 278 then to 280 Surgery date: 07/01/2016 Surgery type: Sleeve Gastrectomy  Start weight at [facility]: 346 Weight today: pt declined Weight change:   TANITA  BODY COMP RESULTS  11/04/2016   BMI (kg/m^2) 33.6   Fat Mass (lbs) 81.2   Fat Free Mass (lbs) 181.8   Total Body Water (lbs) 125.8    24-hr recall: non-linear thoughts going from what he did in the past to ow not wanting to divulge what he is currently eating B (AM): skipped or [place] bacon and cheese  Snk (AM): unsweetened applesauce  L (1:30PM): sandwich and chips Snk (PM): mandarin oranges----string cheese (7g) D (PM): baked chicken and cabbage and greens with smoked [place] with mashed potatoes  Snk (PM): half peanut butter sandwich with chips  Fluid intake: sugar free popcicle, crystal light water: 3-4 40 ounces: 120 ounces, coffee with splenda  Estimated total protein intake: 58g  Medications: see list Supplementation: celebrate and tums calcium  Using straws: no Drinking while eating: no Having you been chewing well: yes Chewing/swallowing  difficulties: no Changes in vision: no Changes to mood/headaches: no Hair loss/Changes to skin/Changes to nails: no Any difficulty focusing or concentrating: no Sweating: no Dizziness/Lightheaded: here and there when standing too fast Palpitations: no  Carbonated beverages: no N/V/D/C/GAS: a little heart burn a little constipation better with miralax, here and there for diarrhea occurred with marsala sauce (3 days) Abdominal Pain: no Dumping syndrome: no  Recent physical activity:  ADL's  Progress Towards Goal(s):  In progress.  Handouts given during visit include:  Ns veggies + protein   Nutritional Diagnosis:  Pelzer-3.3 Overweight/obesity related to past poor dietary habits and physical inactivity as evidenced by patient w/ recent sleeve gastrectomy surgery following dietary guidelines for continued weight loss. Goals: -Get back to chewing until applesauce consistency and taking bites the size of a dime  -Find a youtoube video for workouts starting with 10 minutes at a time at 3 days a week -Yogurt: 9 grams or less of fat and sugar -Eat breakfast Intervention:  Nutrition counseling. Dietitian educated the pt on only having one fruit a day and while he is laid up from surgery no fruit due to him wanting to keep his weight moving forward in loss.   Teaching Method Utilized:  Visual Auditory Hands on  Barriers to learning/adherence to lifestyle change: none identified   Demonstrated degree of understanding via:  Teach Back   Monitoring/Evaluation:  Dietary intake, exercise, and body weight.

## 2017-06-29 NOTE — Patient Instructions (Addendum)
-  Get back to chewing until applesauce consistency and taking bites the size of a dime   -Find a youtoube video for workouts starting with 10 minutes at a time at 3 days a week  -Yogurt: 9 grams or less of fat and sugar  -Eat breakfast

## 2017-08-31 ENCOUNTER — Ambulatory Visit: Payer: Self-pay | Admitting: Skilled Nursing Facility1

## 2017-12-29 ENCOUNTER — Telehealth: Payer: Self-pay | Admitting: Oncology

## 2017-12-29 NOTE — Telephone Encounter (Signed)
FAXED RECORDS TO DR [facility] RELEASE ID [id number]

## 2018-02-24 ENCOUNTER — Telehealth: Payer: Self-pay | Admitting: Radiation Oncology

## 2018-02-24 NOTE — Telephone Encounter (Signed)
Pt called stating that [facility] hasn't rec'd any of his records. I confirmed that they were sent on 10/25. I resent all of his radiation records just now [phone].

## 2018-03-01 ENCOUNTER — Telehealth: Payer: Self-pay | Admitting: *Deleted

## 2018-03-01 NOTE — Telephone Encounter (Signed)
Medical records faxed to [facility]; release [phone]

## 2018-10-24 IMAGING — MR MR FEMUR*L* WO/W CM
8 of 18 series · 17 of 40 positions shown · IV contrast (multihance)
Comparison: Outside MRI 09/02/2015

CLINICAL DATA: Knee pain and history of desmoid fibromatosis.

EXAM:
MR OF THE LEFT LOWER EXTREMITY WITHOUT AND WITH CONTRAST; MRI PELVIS
WITHOUT AND WITH CONTRAST
TECHNIQUE: Multiplanar, multisequence MR imaging of the pelvis was performed
before and after IV contrast administration, using the orthopedic
pelvis protocol.
Multiplanar, multisequence MR imaging of the left thigh was
performed both before and after administration of intravenous
contrast.
CONTRAST:  20 cc MultiHance

[Series 4: T1 · coronal · 4.0mm · 1.56mm/px · 4 of 52 slices shown (1 of 2)]
[im 1/52]
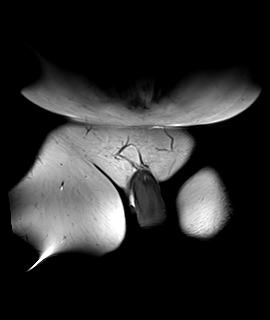
[im 18/52]
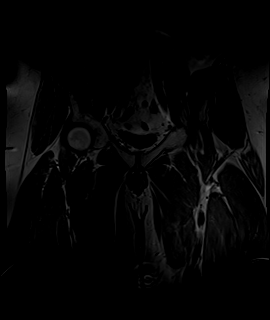
[im 35/52]
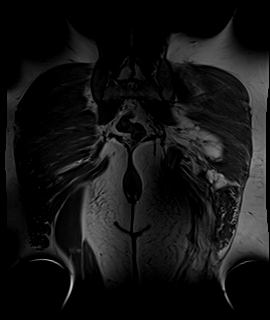
[im 52/52]
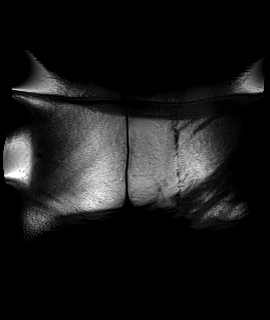

[Series 5: T1 · axial · 6.0mm · 0.78mm/px · z∈[-82,+245]mm · 2 of 40 slices shown (2 of 2)]
[im 1/40]
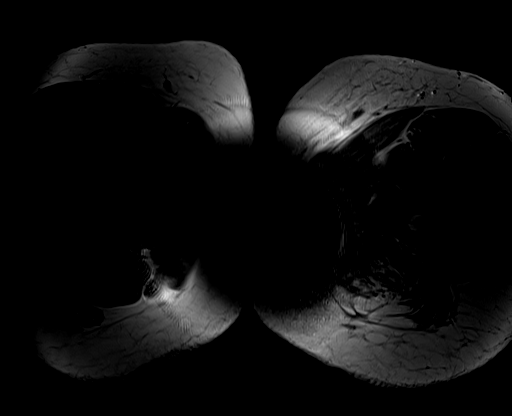
[im 40/40]
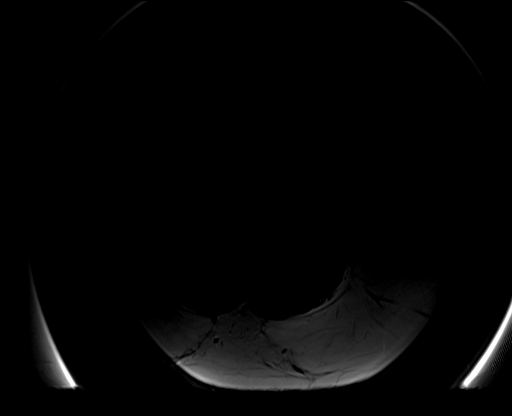

[Series 6: T2 fat-sat · axial · 6.0mm · 0.78mm/px · z∈[-82,+245]mm · 2 of 40 slices shown (1 of 5)]
[im 1/40]
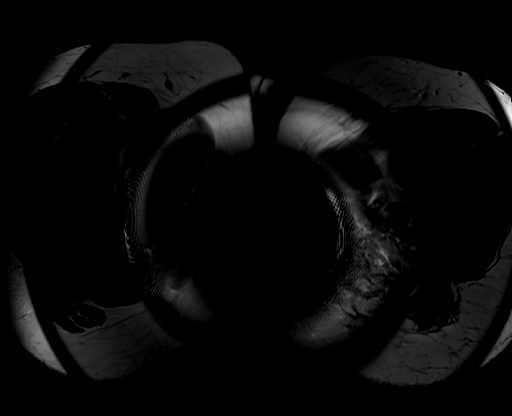
[im 40/40]
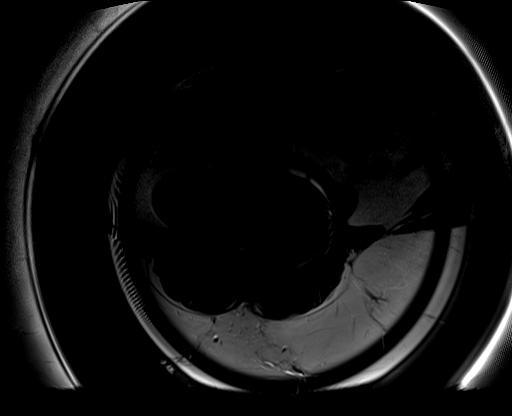

[Series 7: T2 fat-sat · sagittal · 6.0mm · 1.12mm/px · 2 of 45 slices shown (2 of 5)]
[im 1/45]
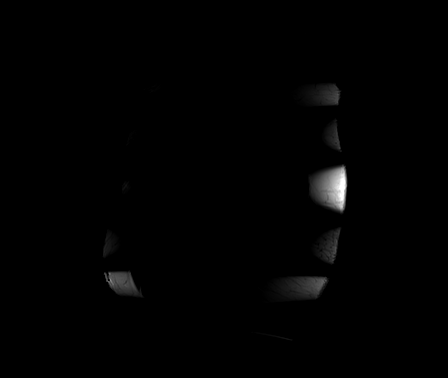
[im 45/45]
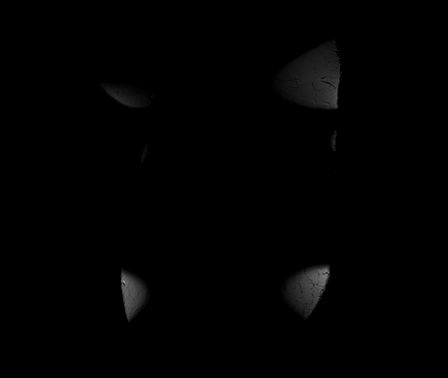

[Series 8: T1 fat-sat · axial · 6.0mm · 0.78mm/px · z∈[-82,+245]mm · 2 of 40 slices shown]
[im 1/40]
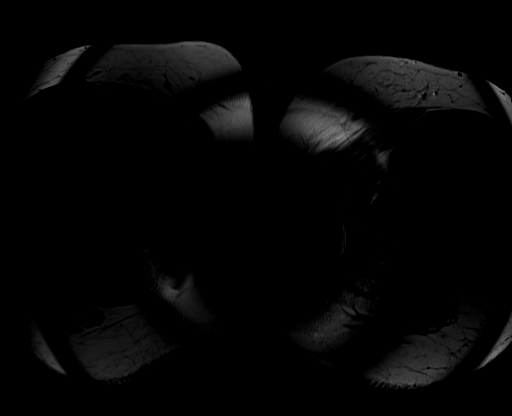
[im 40/40]
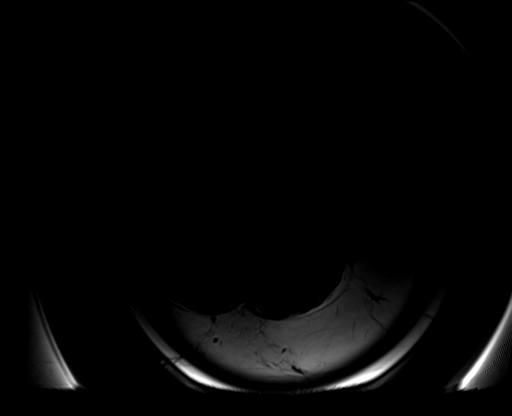

[Series 13: T2 fat-sat · axial · 6.0mm · 0.56mm/px · z∈[-389,-45]mm · 2 of 45 slices shown (3 of 5)]
[im 1/45]
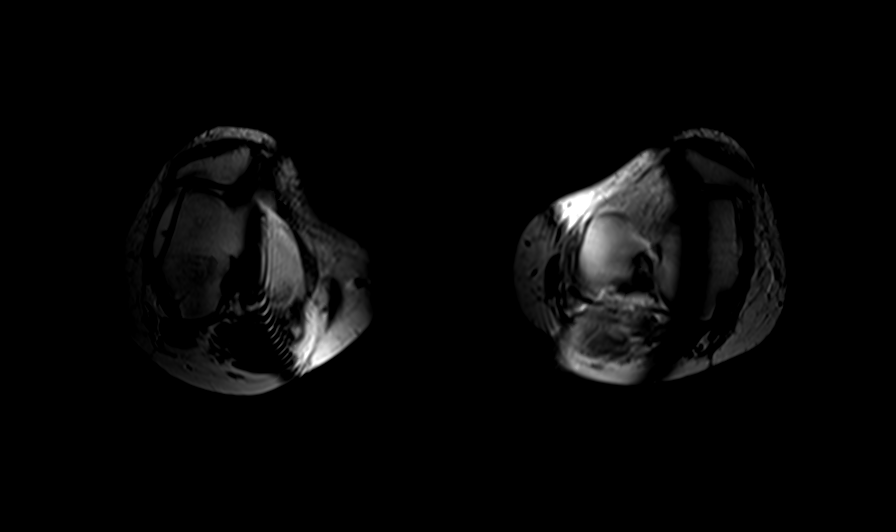
[im 45/45]
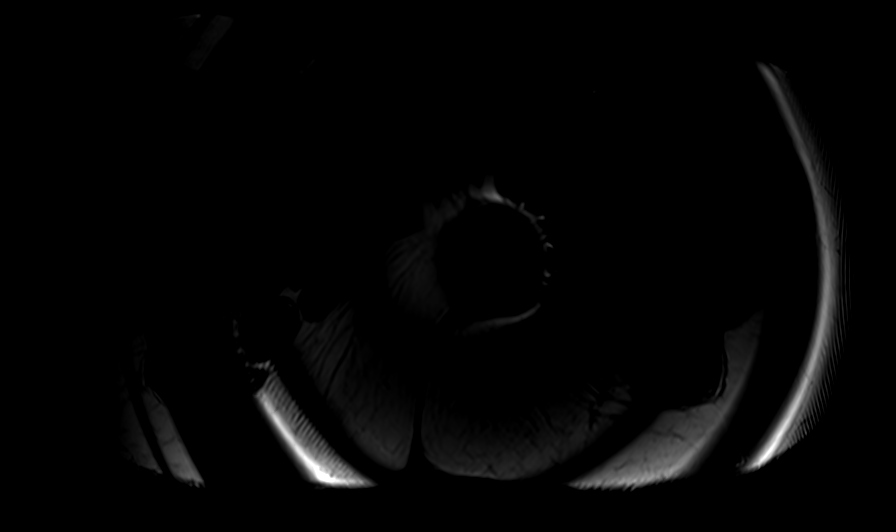

[Series 15: T2 fat-sat · sagittal · 5.0mm · 0.45mm/px · 2 of 30 slices shown (4 of 5)]
[im 1/30]
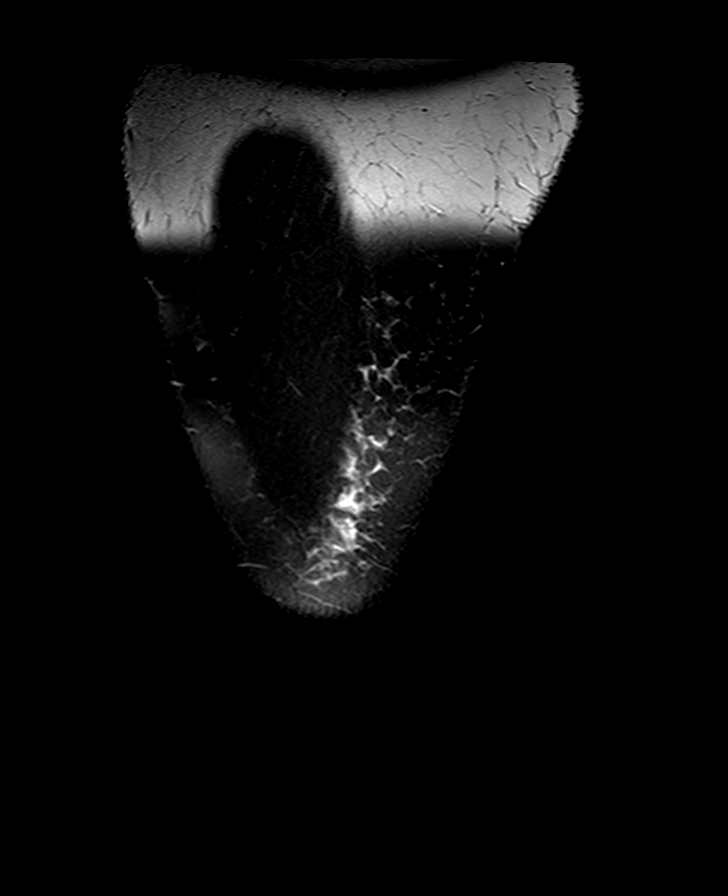
[im 30/30]
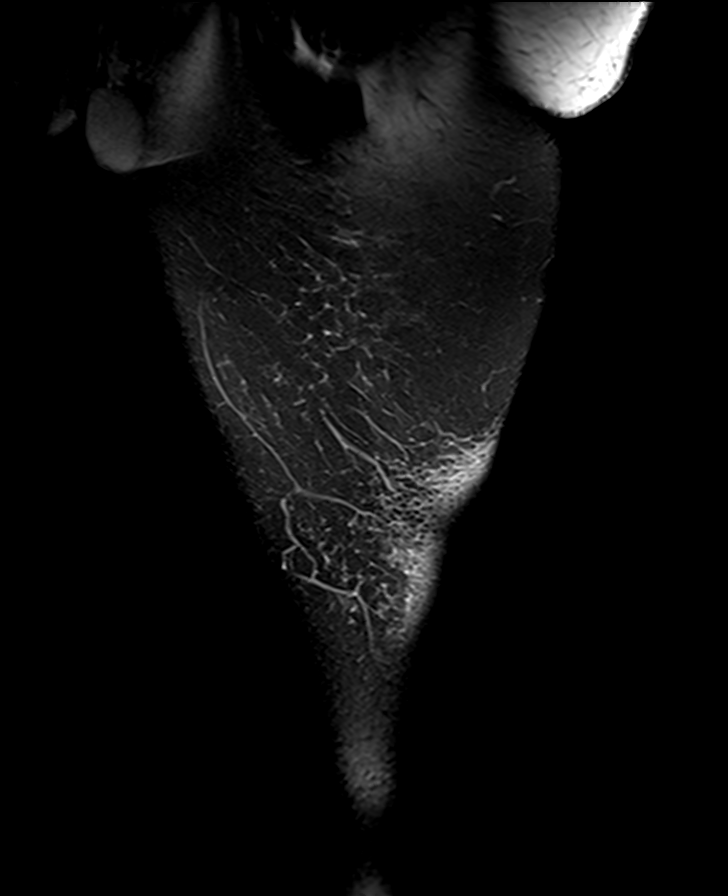

[Series 16: T2 fat-sat · coronal · 6.0mm · 0.56mm/px · 1 of 27 slices shown (5 of 5)]
[im 1/27]
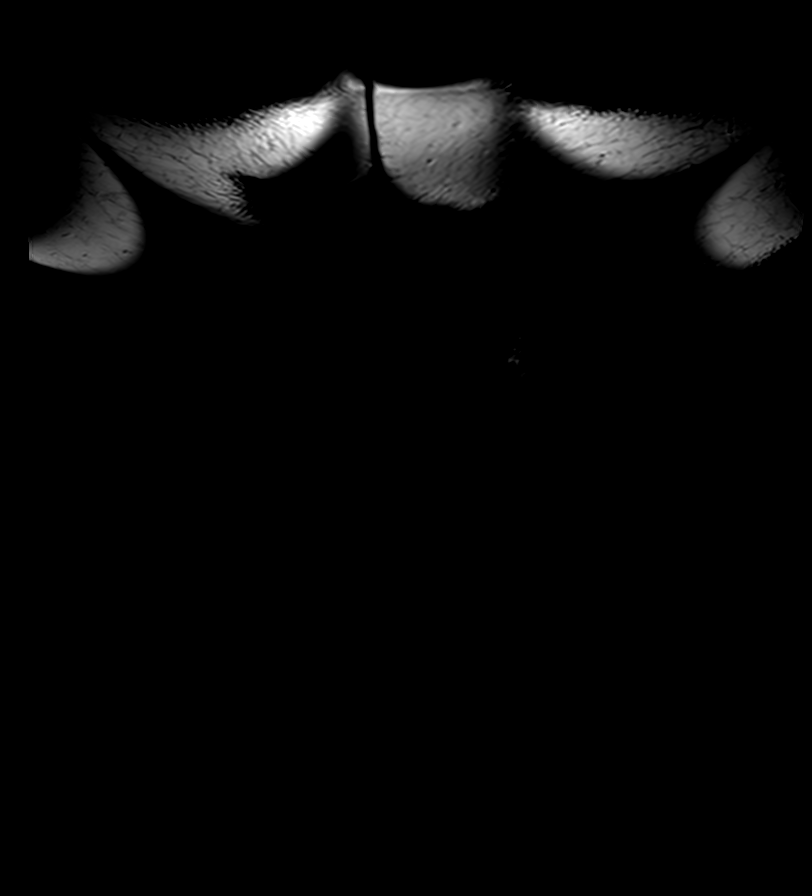

[17 of 40 positions shown; findings below may reference images not displayed]

FINDINGS: Pelvis:

In the proximal left femoral shaft there is a 4.3 by 1.9 cm focus of
minimally reduced T1 and accentuated T2 signal, not dissimilar to
the prior exam from may and likely representing some red marrow or
marrow heterogeneity. Sacroiliac joints unremarkable. Bony pelvis
otherwise unremarkable.

Low-level infiltrative edema and subtle enhancement tracking in the
left gluteus maximus muscle, and also in the left hip adductor
musculature as shown for example is a on image [id number] series 6. This
does not have a significant masslike component. Roughly similar
appearance in this region compared to the prior exam. In the pelvis
and at the sciatic notch, there is no overt sciatic nerve
impingement or significant asymmetry in signal of the sciatic
nerves, although in the left proximal thigh there does appear to be
some asymmetric enlargement of the left sciatic nerve compared to
the right along with surrounding enhancement. No obturator
impingement identified.

Small bilateral indirect inguinal hernias contain adipose tissue.

The prostate gland in urinary bladder unremarkable. Pelvic bowel
unremarkable.

Bones/Joint/Cartilage

Dedicated imaging of the femur extending from the hip down towards
the knee was performed. Distal femur unremarkable.

Ligaments

Not apple couple in this context.

Muscles and Tendons

There is fatty atrophy of the biceps femoris, semitendinosus, and
semimembranosus muscles with fatty replacement, scarring, and
infiltrative low-grade enhancement of these muscles. The adjacent
sciatic nerve demonstrates mild expansion compared to contralateral
side along with low-level edema. That said the enhancement does
appear to be infiltrative rather than masslike, and with a
relatively similar pattern to 09/02/2015 without significant
progression.

Soft tissues

Low-level subcutaneous edema in the posterior left thigh region.
IMPRESSION: 1. There is a similar degree of fatty atrophy of the biceps femoris,
semitendinosus, and semimembranosus in the left thigh with
infiltrative low-grade enhancement in these muscles but without a
masslike component an without significant progression compared to
09/02/15.
2. Within the left thigh, there is some low-level edema and
expansion in the left sciatic nerve compared to the right,
potentially reflecting low-level inflammation.
3. In the pelvis there is similar low-level infiltrative edema and
subtle enhancement in parts of the left gluteus maximus muscle and
also in the left hip adductor musculature. Some of this may be
related to prior external beam therapy or related to the gait
abnormalities.
4. Small bilateral indirect inguinal hernias contain adipose tissue.

## 2019-01-23 ENCOUNTER — Encounter (HOSPITAL_COMMUNITY): Payer: Self-pay

## 2020-01-24 ENCOUNTER — Encounter (HOSPITAL_COMMUNITY): Payer: Self-pay

## 2021-01-23 ENCOUNTER — Encounter (HOSPITAL_COMMUNITY): Payer: Self-pay | Admitting: *Deleted

## 2022-01-02 ENCOUNTER — Encounter (HOSPITAL_COMMUNITY): Payer: Self-pay | Admitting: *Deleted

## 2023-01-28 ENCOUNTER — Encounter (HOSPITAL_COMMUNITY): Payer: Self-pay | Admitting: *Deleted

## 2024-01-19 ENCOUNTER — Encounter (HOSPITAL_COMMUNITY): Payer: Self-pay | Admitting: *Deleted
